# Patient Record
Sex: Female | Born: 1960
Health system: Southern US, Community
[De-identification: ages and names within clinical notes are randomized; demographics above are authoritative.]

## PROBLEM LIST (undated history)

## (undated) DIAGNOSIS — F259 Schizoaffective disorder, unspecified: Secondary | ICD-10-CM

## (undated) DIAGNOSIS — G709 Myoneural disorder, unspecified: Secondary | ICD-10-CM

## (undated) DIAGNOSIS — I10 Essential (primary) hypertension: Secondary | ICD-10-CM

## (undated) HISTORY — DX: Myoneural disorder, unspecified: G70.9

---

## 1998-05-23 ENCOUNTER — Encounter: Admission: RE | Admit: 1998-05-23 | Discharge: 1998-05-23 | Payer: Self-pay | Admitting: Family Medicine

## 1998-08-29 ENCOUNTER — Encounter: Admission: RE | Admit: 1998-08-29 | Discharge: 1998-08-29 | Payer: Self-pay | Admitting: Family Medicine

## 1998-12-11 ENCOUNTER — Encounter: Admission: RE | Admit: 1998-12-11 | Discharge: 1998-12-11 | Payer: Self-pay | Admitting: Family Medicine

## 1999-08-19 ENCOUNTER — Encounter: Admission: RE | Admit: 1999-08-19 | Discharge: 1999-08-19 | Payer: Self-pay | Admitting: Family Medicine

## 1999-08-28 ENCOUNTER — Encounter: Admission: RE | Admit: 1999-08-28 | Discharge: 1999-08-28 | Payer: Self-pay | Admitting: Family Medicine

## 1999-09-16 ENCOUNTER — Encounter: Admission: RE | Admit: 1999-09-16 | Discharge: 1999-09-16 | Payer: Self-pay | Admitting: Family Medicine

## 1999-09-18 ENCOUNTER — Encounter: Admission: RE | Admit: 1999-09-18 | Discharge: 1999-09-18 | Payer: Self-pay | Admitting: Family Medicine

## 2000-10-14 ENCOUNTER — Encounter: Admission: RE | Admit: 2000-10-14 | Discharge: 2000-10-14 | Payer: Self-pay | Admitting: Family Medicine

## 2002-05-09 ENCOUNTER — Other Ambulatory Visit: Admission: RE | Admit: 2002-05-09 | Discharge: 2002-05-09 | Payer: Self-pay | Admitting: Family Medicine

## 2002-05-09 ENCOUNTER — Encounter: Admission: RE | Admit: 2002-05-09 | Discharge: 2002-05-09 | Payer: Self-pay | Admitting: Sports Medicine

## 2002-05-17 ENCOUNTER — Encounter: Admission: RE | Admit: 2002-05-17 | Discharge: 2002-05-17 | Payer: Self-pay | Admitting: Family Medicine

## 2002-05-17 ENCOUNTER — Encounter: Payer: Self-pay | Admitting: Family Medicine

## 2002-10-26 ENCOUNTER — Encounter: Admission: RE | Admit: 2002-10-26 | Discharge: 2002-10-26 | Payer: Self-pay | Admitting: Family Medicine

## 2002-11-23 ENCOUNTER — Encounter: Admission: RE | Admit: 2002-11-23 | Discharge: 2002-11-23 | Payer: Self-pay | Admitting: Family Medicine

## 2002-11-27 ENCOUNTER — Encounter: Payer: Self-pay | Admitting: Family Medicine

## 2002-11-27 ENCOUNTER — Encounter: Admission: RE | Admit: 2002-11-27 | Discharge: 2002-11-27 | Payer: Self-pay | Admitting: Family Medicine

## 2002-12-15 ENCOUNTER — Encounter (INDEPENDENT_AMBULATORY_CARE_PROVIDER_SITE_OTHER): Payer: Self-pay | Admitting: Specialist

## 2002-12-15 ENCOUNTER — Ambulatory Visit (HOSPITAL_COMMUNITY): Admission: RE | Admit: 2002-12-15 | Discharge: 2002-12-16 | Payer: Self-pay

## 2003-01-09 ENCOUNTER — Encounter: Admission: RE | Admit: 2003-01-09 | Discharge: 2003-01-09 | Payer: Self-pay | Admitting: Family Medicine

## 2003-01-21 ENCOUNTER — Emergency Department (HOSPITAL_COMMUNITY): Admission: EM | Admit: 2003-01-21 | Discharge: 2003-01-21 | Payer: Self-pay | Admitting: Emergency Medicine

## 2003-01-21 ENCOUNTER — Emergency Department (HOSPITAL_COMMUNITY): Admission: EM | Admit: 2003-01-21 | Discharge: 2003-01-21 | Payer: Self-pay | Admitting: *Deleted

## 2004-05-19 ENCOUNTER — Encounter (INDEPENDENT_AMBULATORY_CARE_PROVIDER_SITE_OTHER): Payer: Self-pay | Admitting: *Deleted

## 2004-05-19 LAB — CONVERTED CEMR LAB

## 2004-05-22 ENCOUNTER — Ambulatory Visit: Payer: Self-pay | Admitting: Family Medicine

## 2004-05-22 ENCOUNTER — Encounter: Payer: Self-pay | Admitting: Family Medicine

## 2004-05-22 ENCOUNTER — Other Ambulatory Visit: Admission: RE | Admit: 2004-05-22 | Discharge: 2004-05-22 | Payer: Self-pay | Admitting: Family Medicine

## 2004-06-02 ENCOUNTER — Encounter: Admission: RE | Admit: 2004-06-02 | Discharge: 2004-06-02 | Payer: Self-pay | Admitting: Sports Medicine

## 2004-09-14 HISTORY — PX: CHOLECYSTECTOMY: SHX55

## 2006-11-12 ENCOUNTER — Encounter (INDEPENDENT_AMBULATORY_CARE_PROVIDER_SITE_OTHER): Payer: Self-pay | Admitting: *Deleted

## 2007-06-16 ENCOUNTER — Telehealth (INDEPENDENT_AMBULATORY_CARE_PROVIDER_SITE_OTHER): Payer: Self-pay | Admitting: *Deleted

## 2007-06-17 ENCOUNTER — Ambulatory Visit: Payer: Self-pay | Admitting: Family Medicine

## 2007-06-17 DIAGNOSIS — I10 Essential (primary) hypertension: Secondary | ICD-10-CM | POA: Insufficient documentation

## 2007-11-10 ENCOUNTER — Encounter: Payer: Self-pay | Admitting: Family Medicine

## 2007-11-10 ENCOUNTER — Ambulatory Visit: Payer: Self-pay | Admitting: Family Medicine

## 2007-11-17 ENCOUNTER — Ambulatory Visit: Payer: Self-pay | Admitting: Family Medicine

## 2007-11-17 LAB — CONVERTED CEMR LAB
ALT: 18 units/L (ref 0–35)
AST: 15 units/L (ref 0–37)
Albumin: 4.3 g/dL (ref 3.5–5.2)
Alkaline Phosphatase: 98 units/L (ref 39–117)
BUN: 12 mg/dL (ref 6–23)
CO2: 25 meq/L (ref 19–32)
Calcium: 9.7 mg/dL (ref 8.4–10.5)
Chloride: 106 meq/L (ref 96–112)
Cholesterol: 206 mg/dL — ABNORMAL HIGH (ref 0–200)
Creatinine, Ser: 0.77 mg/dL (ref 0.40–1.20)
Glucose, Bld: 91 mg/dL (ref 70–99)
HDL: 55 mg/dL (ref >39)
LDL Cholesterol: 138 mg/dL — ABNORMAL HIGH (ref 0–99)
Potassium: 4.4 meq/L (ref 3.5–5.3)
Sodium: 143 meq/L (ref 135–145)
Total Bilirubin: 0.4 mg/dL (ref 0.3–1.2)
Total CHOL/HDL Ratio: 3.7
Total Protein: 7.2 g/dL (ref 6.0–8.3)
Triglycerides: 63 mg/dL (ref <150)
VLDL: 13 mg/dL (ref 0–40)

## 2007-11-18 ENCOUNTER — Encounter (INDEPENDENT_AMBULATORY_CARE_PROVIDER_SITE_OTHER): Payer: Self-pay | Admitting: *Deleted

## 2007-12-09 ENCOUNTER — Encounter: Admission: RE | Admit: 2007-12-09 | Discharge: 2007-12-09 | Payer: Self-pay | Admitting: Family Medicine

## 2008-12-03 ENCOUNTER — Ambulatory Visit: Payer: Self-pay | Admitting: Family Medicine

## 2008-12-31 ENCOUNTER — Encounter: Payer: Self-pay | Admitting: Family Medicine

## 2009-01-03 ENCOUNTER — Ambulatory Visit: Payer: Self-pay | Admitting: Family Medicine

## 2009-01-03 DIAGNOSIS — E669 Obesity, unspecified: Secondary | ICD-10-CM

## 2009-02-24 ENCOUNTER — Telehealth: Payer: Self-pay | Admitting: Family Medicine

## 2009-03-07 ENCOUNTER — Ambulatory Visit: Payer: Self-pay | Admitting: Family Medicine

## 2009-03-07 DIAGNOSIS — M25559 Pain in unspecified hip: Secondary | ICD-10-CM

## 2009-03-20 ENCOUNTER — Emergency Department (HOSPITAL_COMMUNITY): Admission: EM | Admit: 2009-03-20 | Discharge: 2009-03-20 | Payer: Self-pay | Admitting: Emergency Medicine

## 2009-11-18 ENCOUNTER — Ambulatory Visit: Payer: Self-pay | Admitting: Family Medicine

## 2009-11-18 LAB — CONVERTED CEMR LAB: Pap Smear: NEGATIVE

## 2009-11-21 ENCOUNTER — Encounter: Payer: Self-pay | Admitting: Family Medicine

## 2010-10-14 NOTE — Assessment & Plan Note (Signed)
Summary: CPE/KH   Vital Signs:  Patient profile:   50 year old female Height:      66 inches Weight:      249.2 pounds BMI:     40.37 Temp:     97.8 degrees F oral Pulse rate:   102 / minute BP sitting:   144 / 98  (left arm) Cuff size:   large  Vitals Entered By: Garen Grams LPN (November 19, 5282 2:46 PM) CC: cpe Is Patient Diabetic? No Pain Assessment Patient in pain? no        CC:  cpe.  History of Present Illness: Feels well without complaints  THIGH, PAIN (ICD-719.45) - mild occaisional  OBESITY (ICD-278.00) knows her weight is a problem.  No specific plans  INCREASED BLOOD PRESSURE (ICD-796.2) no headache or chest pain or lightheadedness   ROS - as above PMH - Medications reviewed and updated in medication list.  Smoking Status noted in VS form      Habits & Providers  Alcohol-Tobacco-Diet     Tobacco Status: never  Current Medications (verified): 1)  Haldol Decanoate 50 Mg/ml Soln (Haloperidol Decanoate) .... Given Monthly At Mental Health  Social History: N. A. at [facility].  Lives with husband Jillyn Hidden and daughter and granddtr.  No tobacco no alcohol   Review of Systems  The patient denies anorexia, fever, weight loss, weight gain, vision loss, decreased hearing, hoarseness, chest pain, syncope, dyspnea on exertion, peripheral edema, prolonged cough, headaches, hemoptysis, abdominal pain, melena, hematochezia, severe indigestion/heartburn, hematuria, incontinence, genital sores, muscle weakness, suspicious skin lesions, transient blindness, difficulty walking, depression, unusual weight change, abnormal bleeding, enlarged lymph nodes, and breast masses.    Physical Exam  General:  Well-developed,well-nourished,in no acute distress; alert,appropriate and cooperative throughout examination Head:  Normocephalic and atraumatic without obvious abnormalities. No apparent alopecia or balding. Nose:  External nasal examination shows no deformity or  inflammation. Nasal mucosa are pink and moist without lesions or exudates. Mouth:  Oral mucosa and oropharynx without lesions or exudates.  Teeth in good repair. Neck:  No deformities, masses, or tenderness noted. Breasts:  No mass, nodules, thickening, tenderness, bulging, retraction, inflamation, nipple discharge or skin changes noted.   Lungs:  Normal respiratory effort, chest expands symmetrically. Lungs are clear to auscultation, no crackles or wheezes. Heart:  Normal rate and regular rhythm. S1 and S2 normal without gallop, murmur, click, rub or other extra sounds. Abdomen:  Bowel sounds positive,abdomen soft and non-tender without masses, organomegaly or hernias noted. Genitalia:  Normal introitus for age, no external lesions, no vaginal discharge, mucosa pink and moist, no vaginal or cervical lesions, no vaginal atrophy,  cervix is mildly friailble or hemorrhage, normal uterus size and position, no adnexal masses or tenderness Msk:  No deformity or scoliosis noted of thoracic or lumbar spine.   Extremities:  No clubbing, cyanosis, edema, or deformity noted with normal full range of motion of all joints.   Skin:  Intact without suspicious lesions or rashes Cervical Nodes:  No lymphadenopathy noted Psych:  Cognition and judgment appear intact. Alert and cooperative with normal attention span and concentration. No apparent delusions, illusions, hallucinations   Impression & Recommendations:  Problem # 1:  OBESITY (ICD-278.00) Discussed need for weight control and approaches  Problem # 2:  Preventive Health Care (ICD-V70.0) can go to three year pap if this one is normal.  Needs mammogram   Problem # 3:  INCREASED BLOOD PRESSURE (ICD-796.2) Her husband had hypertension also and they plan to  get a blood pressure cuff.  Will monitor and encourage weight loss and exercise  Complete Medication List: 1)  Haldol Decanoate 50 Mg/ml Soln (Haloperidol decanoate) .... Given monthly at mental  health  Other Orders: Pap Smear-[facility] ([id number]) [facility] - Est  40-64 yrs [phone])  Patient Instructions: 1)  Come back in 6 months to recheck your blood pressure and weight 2)  Schedule your mammogram.  3)  If your pap smear is normal we can check it again in 3 years 4)  Your weight is your main health issue.   You should aim to lose 2-3 lbs every week. 5)  Weigh yourself once a week 6)  Cut back on sweets and fats and decrease each serving of food 7)  Check your blood pressure every week.  Call us if it is usually more than 140/90 8)  Have a wonderful Spring   Prevention & Chronic Care Immunizations   Influenza vaccine: Not documented    Tetanus booster: Not documented    Pneumococcal vaccine: Not documented  Other Screening   Pap smear: Normal  (11/15/2007)   Pap smear action/deferral: Deferred-2 yr interval  (03/07/2009)   Pap smear due: 11/2009    Mammogram: Normal  (12/13/2007)   Mammogram action/deferral: Deferred-2 yr interval  (03/07/2009)   Mammogram due: 12/12/2009   Smoking status: never  (11/18/2009)  Lipids   Total Cholesterol: 206  (11/17/2007)   LDL: 138  (11/17/2007)   LDL Direct: Not documented   HDL: 55  (11/17/2007)   Triglycerides: 63  (11/17/2007)

## 2010-10-14 NOTE — Miscellaneous (Signed)
  Clinical Lists Changes  Observations: Added new observation of PAP DUE: 11/18/2012 (11/21/2009 8:34) Added new observation of PAPRECACT: Deferred-3 yr interval (11/21/2009 8:34) Added new observation of DM PROGRESS: N/A (11/21/2009 8:34) Added new observation of DM FSREVIEW: N/A (11/21/2009 8:34) Added new observation of HTN PROGRESS: N/A (11/21/2009 8:34) Added new observation of HTN FSREVIEW: N/A (11/21/2009 8:34) Added new observation of LIPID PROGRS: N/A (11/21/2009 8:34) Added new observation of LIPID FSREVW: N/A (11/21/2009 8:34)      Prevention & Chronic Care Immunizations   Influenza vaccine: Not documented    Tetanus booster: Not documented    Pneumococcal vaccine: Not documented  Other Screening   Pap smear: NEGATIVE FOR INTRAEPITHELIAL LESIONS OR MALIGNANCY.  (11/18/2009)   Pap smear action/deferral: Deferred-3 yr interval  (11/21/2009)   Pap smear due: 11/18/2012    Mammogram: Normal  (12/13/2007)   Mammogram action/deferral: Deferred-2 yr interval  (03/07/2009)   Mammogram due: 12/12/2009   Smoking status: never  (11/18/2009)  Lipids   Total Cholesterol: 206  (11/17/2007)   LDL: 138  (11/17/2007)   LDL Direct: Not documented   HDL: 55  (11/17/2007)   Triglycerides: 63  (11/17/2007)

## 2010-10-16 ENCOUNTER — Encounter: Payer: Self-pay | Admitting: *Deleted

## 2010-12-03 ENCOUNTER — Encounter: Payer: Self-pay | Admitting: Family Medicine

## 2010-12-03 ENCOUNTER — Ambulatory Visit (INDEPENDENT_AMBULATORY_CARE_PROVIDER_SITE_OTHER): Payer: Medicare Other | Admitting: Family Medicine

## 2010-12-03 VITALS — BP 134/97 | HR 86 | Temp 97.7°F | Ht 66.0 in | Wt 246.0 lb

## 2010-12-03 DIAGNOSIS — R030 Elevated blood-pressure reading, without diagnosis of hypertension: Secondary | ICD-10-CM

## 2010-12-03 DIAGNOSIS — E669 Obesity, unspecified: Secondary | ICD-10-CM

## 2010-12-03 NOTE — Progress Notes (Signed)
  Subjective:     Sally Wang is a 50 y.o. female and is here for a comprehensive physical exam. The patient reports no problems.  History   Social History  . Marital Status: Married    Spouse Name: [name]    Number of Children: N/A  . Years of Education: N/A   Occupational History  . homemaker    Social History Main Topics  . Smoking status: Never Smoker   . Smokeless tobacco: Never Used  . Alcohol Use: No  . Drug Use: No  . Sexually Active: Yes   Other Topics Concern  . Not on file   Social History Narrative   Husband - Sandeep Delagarza [id number] Cecilio Asper -[id number] -2010 granddaughtePets -  [employer] 4   Health Maintenance  Topic Date Due  . Tetanus/tdap  09/10/1980  . Pap Smear  11/18/2012    The following portions of the patient's history were reviewed and updated as appropriate: allergies, current medications, past family history, past medical history, past social history, past surgical history and problem list.  Review of Systems Patient reports no weight loss, fever problems walking, chest pain, frequent cough, hemoptysis, dyspnea, breast lumps, gastrointestinal bleeding, abdominal  masses, excessive heart burn, visual problems, hearing problems, adenopathy, prolonged hoarseness, syncope, focal weakness, memory loss, incontinence,  dysuria, hematuria, changing moles, skin ulcers, edema, depression, anxiety, vaginal sores or discharge.    Objective:    General appearance: alert and cooperative  Heart:  Normal rate and regular rhythm. S1 and S2 normal without gallop, murmur, click, rub or other extra sounds Lungs:  Normal respiratory effort, chest expands symmetrically. Lungs are clear to auscultation, no crackles or wheezes. Neck:  No deformities, thyromegaly, masses, or tenderness noted.   Full range of motion without pain. Abdomen:  soft and non-tender without masses, organomegaly or hernias noted.  No guarding or rebound Extremities:  No cyanosis, edema, or  deformity noted with good range of motion of all major joints.   Eye - Pupils Equal Round Reactive to light, Extraocular movements intact, Fundi without hemorrhage or visible lesions, Conjunctiva without redness or discharge Ears:  External ear exam shows no significant lesions or deformities.  Otoscopic examination reveals clear canals, tympanic membranes are intact bilaterally without bulging, retraction, inflammation or discharge. Hearing is grossly normal bilaterall    Assessment:      Healthy  Plan:     See After Visit Summary for Counseling Recommendations

## 2010-12-03 NOTE — Assessment & Plan Note (Signed)
Mildly elevated today.  She relates is usually well controlled with home wrist monitor. Is slowly losing weight.  Will monitor and encourage more weight loss and exercise

## 2010-12-03 NOTE — Patient Instructions (Signed)
Try to check your blood pressure on a machine every 1-2 weeks.  If it is regularly > 150/95 then come in for a visit.  Bring your readings in when you come back Things to do to Keep Yourself Healthy See your Eye Doctor  Exercise at least 30 minutes a day, 5-7 days a week.  Eat a low-fat diet with lots of fruits and vegetables. Avoid obesity. Seatbelts can save your life. Wear them always. Smoke detectors on every level of your home, check batteries every year. Eye Doctor - have an eye exam regularly Safe sex - if you may be exposed to STDs, use a condom. Alcohol If you drink, do it moderately - 1 drink per day or less. Never drink and drive. Health Care Power of Attorney - choose one ... ask [place] for forms. Depression is common - If you're feeling down or losing interest in things you normally enjoy, please call us Violence - If anyone is threatening or hurting you, please call us

## 2010-12-03 NOTE — Assessment & Plan Note (Signed)
Recommend continued weight loss and try to exercise

## 2011-01-30 NOTE — Op Note (Signed)
Sally Wang, Sally Wang                            ACCOUNT NO.:  [id number]   MEDICAL RECORD NO.:  [id number]                   PATIENT TYPE:  OIB   LOCATION:  2869                                 FACILITY:  [facility]   PHYSICIAN:  Lorre Munroe., M.D.            DATE OF BIRTH:  1960-12-17   DATE OF PROCEDURE:  12/15/2002  DATE OF DISCHARGE:                                 OPERATIVE REPORT   PREOPERATIVE DIAGNOSIS:  Symptomatic gallstones.   POSTOPERATIVE DIAGNOSIS:  Symptomatic gallstones.   OPERATION/PROCEDURE:  Laparoscopic cholecystectomy with operative  cholangiogram.   SURGEON:  Lebron Conners, M.D.   ASSISTANT:  Gabrielle Dare. Janee Morn, M.D.   ANESTHESIA:  General.   DESCRIPTION OF PROCEDURE:  After the patient was monitored and anesthetized  and had routine preparation and draping of the abdomen, I infiltrated local  anesthetic just below the umbilicus and subsequently in three additional  port sites.  I made a short transverse incision below the umbilicus and  dissected down through the midline, opened it longitudinally and then  bluntly entered the abdominal cavity.  I placed a 0 Vicryl pursestring  suture in the fascia and secured a Hasson cannula, then inflated the abdomen  with CO2.  I saw no abnormalities when I put in the camera.  I put in three  additional ports, positioned the patient head-up-foot-down and left tilted  and exposed the gallbladder.  There appeared to be a replaced right hepatic  artery which was clearly visible and the superficial hepatoduodenal  ligament.  The cystic artery could be seen coming off that and I dissected  it out and clipped and divided the cystic artery.  I then dissected out the  cystic duct and saw it clearly emerging from the infundibulum of the  gallbladder.  Placed a single clip at the distal infundibulum of the  gallbladder. I made a small hole in the cystic duct.  I put in a Reddick  cholangiogram catheter secured with a single  clip, and performed a  fluoroscopic cholangiogram which demonstrated normal size ducts with normal  anatomy and free flow into the duodenum with no evident filling defects.  A  short segment of the pancreatic duct also filled and there was no  abnormality of that evident either.  I then removed the cholangiogram  catheter and placed two clips on cystic duct distal to the hole and divided  it.  I dissected the gallbladder from the liver using the cautery.  I  accidentally made a couple of holes in it and a few stones spilled out but I  was able to scoop up and remove all of the stones.  After detaching the  gallbladder from the liver, I placed it in a plastic pouch and placed it  above the liver, then got good hemostasis in the gallbladder fossa and  copiously irrigated the right upper quadrant and removed the irrigant.  I  removed the gallbladder from the body through the umbilical incision and  tied the pursestring suture.  I then allowed the CO2 to escape after  removing the lateral ports and removed the epigastric port and closed all  incisions with intracuticular 4-0 Vicryl and Steri-Strips.                                                Lorre Munroe., M.D.    Jodi Marble  D:  12/15/2002  T:  12/17/2002  Job:  [id number]   cc:   Pearlean Brownie, M.D.  [address] [place]  [place] [address]  Fax: [phone]

## 2011-06-17 ENCOUNTER — Ambulatory Visit (INDEPENDENT_AMBULATORY_CARE_PROVIDER_SITE_OTHER): Payer: Medicare Other | Admitting: Family Medicine

## 2011-06-17 ENCOUNTER — Encounter: Payer: Self-pay | Admitting: Family Medicine

## 2011-06-17 VITALS — BP 158/90 | HR 85 | Temp 98.1°F | Wt 257.0 lb

## 2011-06-17 DIAGNOSIS — R030 Elevated blood-pressure reading, without diagnosis of hypertension: Secondary | ICD-10-CM

## 2011-06-17 DIAGNOSIS — Z23 Encounter for immunization: Secondary | ICD-10-CM

## 2011-06-17 LAB — COMPREHENSIVE METABOLIC PANEL
AST: 17 U/L (ref 0–37)
Albumin: 4.2 g/dL (ref 3.5–5.2)
Alkaline Phosphatase: 98 U/L (ref 39–117)
Glucose, Bld: 79 mg/dL (ref 70–99)
Potassium: 4.3 mEq/L (ref 3.5–5.3)
Sodium: 139 mEq/L (ref 135–145)
Total Bilirubin: 0.3 mg/dL (ref 0.3–1.2)
Total Protein: 7.4 g/dL (ref 6.0–8.3)

## 2011-06-17 LAB — CBC
Hemoglobin: 13.7 g/dL (ref 12.0–15.0)
MCH: 29.3 pg (ref 26.0–34.0)
MCHC: 31.9 g/dL (ref 30.0–36.0)
MCV: 91.9 fL (ref 78.0–100.0)
Platelets: 329 10*3/uL (ref 150–400)
RBC: 4.67 MIL/uL (ref 3.87–5.11)

## 2011-06-17 NOTE — Assessment & Plan Note (Signed)
Likely hypertension.  Will check labs and encourage weight control and salt reduction.

## 2011-06-17 NOTE — Progress Notes (Signed)
  Subjective:    Patient ID: Sally Wang, female    DOB: 11-Jan-1961, 50 y.o.   MRN: [id number]  HPI  HYPERTENSION Disease Monitoring Home BP Monitoring usual 140-50s/90s Chest pain- no     Dyspnea-  no  Medications Compliance: not currently on medications for this problem. Lightheadedness-  no  Edema-  no   ROS - See HPI  PMH Cardiovascular risk factors: family history of premature cardiovascular disease Lab Review   Potassium  Date Value Range Status  11/17/2007 4.4  3.5-5.3 (meq/L) Final     Sodium  Date Value Range Status  11/17/2007 143  135-145 (meq/L) Final     Review of Symptoms - see HPI  PMH - Smoking status noted.       Review of Systems     Objective:   Physical Exam  Heart - Regular rate and rhythm.  No murmurs, gallops or rubs.    Lungs:  Normal respiratory effort, chest expands symmetrically. Lungs are clear to auscultation, no crackles or wheezes. Extremities:  No cyanosis, edema, or deformity noted with good range of motion of all major joints.         Assessment & Plan:

## 2011-06-17 NOTE — Patient Instructions (Signed)
Continue to check your blood pressure write down the readings and bring in next visit  Work on decreasing your salt intake and high calorie foods  Walk every day for exercise  I will call you if your lab tests are not normal.  Otherwise we will discuss them at your next visit.  See Rosalita Chessman and me in one month

## 2011-07-22 ENCOUNTER — Ambulatory Visit (INDEPENDENT_AMBULATORY_CARE_PROVIDER_SITE_OTHER): Payer: Medicare Other | Admitting: Family Medicine

## 2011-07-22 ENCOUNTER — Ambulatory Visit: Payer: Medicare Other | Admitting: Family Medicine

## 2011-07-22 ENCOUNTER — Encounter: Payer: Self-pay | Admitting: Family Medicine

## 2011-07-22 VITALS — BP 139/85 | HR 91 | Temp 98.4°F | Ht 66.0 in | Wt 256.5 lb

## 2011-07-22 DIAGNOSIS — R030 Elevated blood-pressure reading, without diagnosis of hypertension: Secondary | ICD-10-CM

## 2011-07-22 DIAGNOSIS — E669 Obesity, unspecified: Secondary | ICD-10-CM

## 2011-07-22 NOTE — Patient Instructions (Addendum)
1. Keep 24 hour food journal, call Rosalita Chessman by Friday. 2. Think about way to start moving more, to get fit 3. Watch salt in diet, 1500 mg a day.   Watch sodium is not getter than 10%. 4. Keep blood pressure record once a week on back of Velia Pamer's card. 5. Follow up appointment February 2013.

## 2011-07-22 NOTE — Assessment & Plan Note (Addendum)
Assessment:  Condition remains unchanged.  Pt understand importance of being fit and weight loss.  Plan: Keep 24 hour food journal and follow up with health coach by Friday 11/9.  Reduce sodium in diet. Increase daily movement.  Continued health coaching for diet/exercise/lifestyle changes.  Follow up in 3 months. Patient seems very committed to improvement

## 2011-07-22 NOTE — Progress Notes (Signed)
  Subjective:    Patient ID: Sally Wang, female    DOB: 05-13-1961, 50 y.o.   MRN: [id number]  HPI INCREASED BLOOD PRESSURE Disease Monitoring Home BP Monitoring not doing Chest pain- no     Dyspnea-  no  Medications Compliance: not currently on medications for this problem. Lightheadedness-  no  Edema-  no   ROS - See HPI  PMH Cardiovascular risk factors: obesity (BMI >= 30 kg/m2), sedentary lifestyle and smoking/ tobacco exposure Lab Review   Potassium  Date Value Range Status  06/17/2011 4.3  3.5-5.3 (mEq/L) Final     Sodium  Date Value Range Status  06/17/2011 139  135-145 (mEq/L) Final       OBESITY  Current weight/BMI : 41.40 How long have been obese:  10 + years Course:  No change Problems or symptoms it causes:  Blood pressure   Things have tried to improve: diet modifications  Patient Identified Concern:  Pt would like to become more fit.  That includes diet change and increased exercise. Stage of Change Patient Is In:  Pt is in contemplation (planning on making changes in next 6 months.) Patient Reported Barriers:  Habits, busy helping others, money Patient Reported Perceived Benefits:  Feeling good and able to help others.  Patient Reports Self-Efficacy:   Pt displays some self-efficacy Behavior Change Supports:  Husband is also trying to make health changes Goals:  To modify diet at home, reduce sodium Patient Education:  We discussed a low sodium diet, ways to track and modify diet, ways to increase daily movement. Discussed what hypertension was, was numbers mean and how they apply to her life.      Review of Systems     Objective:   Physical Exam  No acute distress      Assessment & Plan:

## 2011-07-23 NOTE — Assessment & Plan Note (Signed)
Improved with diet and some exercise.  Recent blood work all normal.  Continue current lifestyle changes and monitor

## 2011-07-24 ENCOUNTER — Telehealth: Payer: Self-pay | Admitting: Home Health Services

## 2011-07-24 NOTE — Telephone Encounter (Signed)
Spoke with Sally Wang.  Pt was able to recall 3 day food journal to me.  Pt has increased her water intake and still drinks 2 cups of coffee a day.     Pt shared that she had pizza for dinner and then the following breakfast and I took the opportunity to talk about how much sodium is in pizza and that 2 slices was her daily amount.  This was surprising to her and we discussed alternatives to meat pizza such as plain or vegetable that has less sodium.  Pt also shared that she is only eating 2 times a day. We discussed importance of eating small meals throughout day vs 2 large ones for weight loss.   Pt is motivated to make changes and realizes that the changes are going to be small.    Joory set goal to eat small meals with healthy snacks in between and not going more than 5 hours without eating.   Pt's overall goal is to get fit, lose weight, and prevent HTN.

## 2011-07-30 ENCOUNTER — Telehealth: Payer: Self-pay | Admitting: Home Health Services

## 2011-07-30 NOTE — Telephone Encounter (Signed)
Spoke with Sally Wang.  Pt reports feeling good and having good week.  Pt went through diet for last few days and it seems as if she is eating high sodium pre-prepared foods often as well as eating fast food.  It may be important to review exactly what a lower sodium diet looks like at next appointment.  Pt has increased her daily water to over 32 oz a day but also drink 2 cups of coffee and soda daily.   Pt is not currently on any daily medications and has not been taking her bp.  Pt says she will take her bp next time she is at [employer] and maybe invest in a bp machine for the family.   Pt reports walking a little bit more and understand the importance of getting fit to feel and look good.   Pt's overall goal is weight loss and management of her bp before developing into HTN.

## 2011-08-14 ENCOUNTER — Telehealth: Payer: Self-pay | Admitting: Home Health Services

## 2011-08-14 NOTE — Telephone Encounter (Signed)
Spoke with Sally Wang  Pt not currently taking medications.  Pt not monitoring her bp.  Pt reports cooking with lower sodium and has started exercising from TV program 2x a week.  Pt reports feeling good and doesn't feel tired or sick.   Pt reports having increased her daily water to over 50 oz a day.  This weeks goals: to continue salt reduction diet, exercise, and drink water  Pt's overall goal is weight loss, bp management.

## 2011-08-27 ENCOUNTER — Telehealth: Payer: Self-pay | Admitting: Home Health Services

## 2011-08-27 NOTE — Telephone Encounter (Signed)
Spoke with Sally Wang.  Pt not currently on medications. Pt not currently monitoring her bp. Does not feel dizzy nor has headaches.  Pt reports feeling great and less tired.  Pt reports eating less fried foods, has switched sugar in coffee to artificial sweetener.   Continues to increase daily water. Continues to cook with less salt.  Pt reports exercising with TV every other day.  Pt spend 1-2 hours daily praying/reading scriptures.  Pt reports sleeping 5-6 hours a night and when she feels tired naps in the day.  Pt's goal this next week is to continue behavior changes through the holidays.  Continue to serve others and take time for herself.  Pt's overall goal is weight loss and improved health.

## 2011-09-18 ENCOUNTER — Telehealth: Payer: Self-pay | Admitting: Home Health Services

## 2011-09-18 NOTE — Telephone Encounter (Signed)
Spoke with Tauna.  Pt reports feeling good.  Pt not self-monitoring BP.  Pt reports having some set backs with diet during the holidays but recently has started new diet changes like avoiding salt, eating fruits and vegetables, and drinking water.  Pt started these changes yesterday and is encouraged to continue with them.  Pt reports that she might have gained weight over holidays.  Pt's goal this week is increase water intake, decrease soda/tea.  Pt's overall goal is weight loss, exercise, avoid HTN.

## 2011-09-25 ENCOUNTER — Telehealth: Payer: Self-pay | Admitting: Home Health Services

## 2011-09-25 NOTE — Telephone Encounter (Signed)
Spoke with Laurieanne.  Pt reports feeling good.  Pt was able to reduce fatty food intake and decrease soda/tea this past week.    Pt has not started any regular exercise routine.  Pt is not interested in losing a lot of weight but wants to feel good.  Pt set goal to eat atleast 1service of vegetables a day for this next week.  Pt's overall goal is to achieve fitnessx diet changes, and exercise routine to avoid HTN.

## 2011-10-08 ENCOUNTER — Telehealth: Payer: Self-pay | Admitting: Home Health Services

## 2011-10-08 NOTE — Telephone Encounter (Signed)
Spoke with Sally Wang.  Pt reports feeling good.   Pt self-reported wt is at 257 lbs, no wt changes.  Pt recently took bp and it was 140/91.  Pt is aware this is borderline but feels she can make the changes necessary to avoid medications.    We discussed ways to decrease bp and pt was able to generalize changes but really unable to make specific goals for her self.  I continued to ask her to make 1 specific goal for this week and again she generalized changes such as eating "healthier", exercising, and lowering salt intake.  Pt couldn't quantify how much exercise, what she actually is eating, and how much water or salt she was in taking.   Pt did mention doing exercises with tv program and walking out side when the weather is nice.  Pt did report eating more vegetables this past week but not every day.  Pt's overall goal is lowering wt and bp.

## 2011-10-23 ENCOUNTER — Telehealth: Payer: Self-pay | Admitting: Home Health Services

## 2011-10-23 NOTE — Telephone Encounter (Signed)
Spoke with Sally Wang.  Pt reports trying to eat vegetables every day.  She has also been able to start eating more regularly at least 3x a day.   Pt reports doing home exercises at least 3x a week.    Pt reports feeling good.  At next Health Coaching appointment we need to go over a balance, low sodium diet to start.  Pts overall goal is wt loss and htn prevention.

## 2011-10-30 ENCOUNTER — Telehealth: Payer: Self-pay | Admitting: Home Health Services

## 2011-10-30 NOTE — Telephone Encounter (Signed)
Spoke with Sally Wang.  Pt reports feeling well.    Pt reports exercising with TV program 4x this past week and walked outside one day.  Pt reports being more mindful about foods she eats and used example of ordering water instead of soda at restaurant.  We discussed what a low sodium diet looked like including limiting processed meats, adding salt to foods, and eating ramen noodles.  We talked about being mindful of how much salt she eats every day.  Pt set goal to continue exercising and making healthier food choices for this next week.  Pt's overall goal is wt loss, pre-htn management.   Schedule fu appointment for 12/02/11.

## 2011-11-06 ENCOUNTER — Telehealth: Payer: Self-pay | Admitting: Home Health Services

## 2011-11-06 NOTE — Telephone Encounter (Signed)
Spoke with Sally Wang.  Pt reports feeling well.  Pt measured bp on 2/19: 154/90.  Pt is aware that her bp is moving beyond borderline and that she needs to continue to make lifestyle changes to avoid medications.    Pt reported exercising 3-4x this past week and using stairs in the home for 15 mins daily.  We talked about continuing to lower sodium in the diet when eating out and cooking.  Pt's goal this week is to continue with exercises and dietary changes.  Pt's overall goal is wt loss and lowering bp.

## 2011-11-27 ENCOUNTER — Telehealth: Payer: Self-pay | Admitting: Home Health Services

## 2011-11-27 NOTE — Telephone Encounter (Signed)
Spoke with Kimyah.  Pt reports feeling okay.  Pt was unable to maintain behavior changes around diet/exercise over past 2 weeks.  Pt reports drinking "giving in" to cravings for soda.  We talked about creating specific goals and to think about specific goals that we can talk about at our next appointment next Wednesday.  It is important to create these specific goals for health coaching to be effectives.  Pt agreed.  Pt's overall goal is wt loss, pre-htn prevention.

## 2011-12-02 ENCOUNTER — Ambulatory Visit (INDEPENDENT_AMBULATORY_CARE_PROVIDER_SITE_OTHER): Payer: Medicare Other | Admitting: Family Medicine

## 2011-12-02 VITALS — BP 139/93 | HR 81 | Temp 97.8°F | Ht 66.0 in | Wt 263.4 lb

## 2011-12-02 DIAGNOSIS — R030 Elevated blood-pressure reading, without diagnosis of hypertension: Secondary | ICD-10-CM

## 2011-12-02 DIAGNOSIS — E669 Obesity, unspecified: Secondary | ICD-10-CM

## 2011-12-02 NOTE — Patient Instructions (Signed)
1. Schedule colonoscopy with [facility]. 2. Take blood pressure 3-4 times a week and keep a record. 3. If your blood pressure stays higher than 140/90, then schedule appointment.  4. Continue watch your diet and exercise. 5. Follow up with Dr. Deirdre Priest in 2-3 months.

## 2011-12-03 ENCOUNTER — Encounter: Payer: Self-pay | Admitting: Family Medicine

## 2011-12-03 NOTE — Progress Notes (Signed)
  Subjective:    Patient ID: Sally Wang, female    DOB: 11-06-1960, 51 y.o.   MRN: [id number]  HPI HYPERTENSION Disease Monitoring Home BP Monitoring not doing regularly most recent 157/94 Chest pain- no     Dyspnea-  no  Medications Compliance: not currently on medications for this problem. Lightheadedness-  no  Edema-  no   ROS - See HPI  PMH Lab Review   Potassium  Date Value Range Status  06/17/2011 4.3  3.5-5.3 (mEq/L) Final     Sodium  Date Value Range Status  06/17/2011 139  135-145 (mEq/L) Final        FHx - father died of MI in his 70s    OBESITY Current weight/BMI : Body mass index is 42.51 kg/(m^2).    How long have been obese:  10+ yrs Course:  Somewhat worsening Problems or symptoms it causes:  Pre-htn   Things have tried to improve:  Portion control   Patient Identified Concern:  Pt did not identify any concerns.  Pt does not feel that is anything she would like to work on right now.   We decided that she is finished for the time being with working with a Control and instrumentation engineer.  We left it open to restart if she feels in the future that she would need coaching.   We talked about her be pre-hypertension and how her daily behaviors with diet and exercise influence her blood pressure and weight.  Pt understand but feels good and is not concerned.       Review of Systems     Objective:   Physical Exam  No acute distress.       Assessment & Plan:

## 2011-12-03 NOTE — Assessment & Plan Note (Addendum)
Assessment:  No improvement.  Pt is not self-monitoring.  Plan: Pt will start to take BP 3-4 times a week and keep a records,  pt will continue to make lifestyle changes and follow up with in 2-3 months.   Patient has one risk factor of + father died of coronary artery disease in his 43s.  Would likely not use medications unless > upper 140s/90s.  Continue to monitor

## 2011-12-03 NOTE — Assessment & Plan Note (Signed)
Assessment:  Somewhat worsening.  Pt understand the importance of fitness but feels fine now and doesn't feel like she needs to lose weight.  Plan:  Pt will continue to monitor portions and types of foods she eats.

## 2012-05-15 ENCOUNTER — Telehealth: Payer: Self-pay | Admitting: Family Medicine

## 2012-05-15 NOTE — Telephone Encounter (Signed)
EMERGENCY LINE: Patient calls to report 3 days of "touch of laryngitis". Feels overall well. No fevers or other URI symptoms. Appetite normal, no difficulty breathing. Drinking hot tea which helps.  I do not feel patient needs to come to emergency dept at this time, and she agrees. Encourage her to continue with symptomatic treatment with hot tea, warm salt water gargles and tylenol as needed. She agrees if anything changes, she will not hesitate to come in or call our clinic when it opens on Tuesday.  Yvette Roark M. Asiana Benninger, M.D. 05/15/2012 1:48 AM

## 2012-06-05 ENCOUNTER — Telehealth: Payer: Self-pay | Admitting: Family Medicine

## 2012-06-05 NOTE — Telephone Encounter (Signed)
Patient called because she has had 3 days of a pruritic rash on her arms, top of her feet, and chin.  No fevers/chills or other systemic symptoms.  Grand-children who she helps take care of have been recently diagnosed with poison ivy.  Recommended that she try benadryl and possibly OTC hydrocortisone and make appointment in clinic if not improving in 1-2 days.

## 2013-07-05 ENCOUNTER — Encounter: Payer: Self-pay | Admitting: Home Health Services

## 2013-08-07 ENCOUNTER — Encounter: Payer: Medicare Other | Admitting: Family Medicine

## 2013-08-07 ENCOUNTER — Encounter: Payer: Self-pay | Admitting: Family Medicine

## 2013-08-07 ENCOUNTER — Ambulatory Visit (INDEPENDENT_AMBULATORY_CARE_PROVIDER_SITE_OTHER): Payer: Medicare Other | Admitting: Family Medicine

## 2013-08-07 VITALS — BP 159/105 | HR 96 | Temp 98.0°F | Ht 66.0 in | Wt 266.0 lb

## 2013-08-07 DIAGNOSIS — I10 Essential (primary) hypertension: Secondary | ICD-10-CM

## 2013-08-07 DIAGNOSIS — Z7189 Other specified counseling: Secondary | ICD-10-CM

## 2013-08-07 NOTE — Progress Notes (Signed)
Subjective:     Patient ID: Sally Wang, female   DOB: Nov 11, 1960, 52 y.o.   MRN: [id number]  HPI 52 y.o. F here for physical. Pt discussed at length desire to not have medical care and discussed appropriate medical screening and therapy. Pt states that she will pray about these recommendations and will return in 2 weeks for further discussion vs labs and screening. Pt husband, Jillyn Hidden, Was present for the entire discussion and stated understanding as well.   Filed Vitals:   08/07/13 1114  BP: 159/105  Pulse: 96  Temp: 98 F (36.7 C)   NAD  Review of Systems     Objective:   Physical Exam  #HTN: pt currently refusing medication and will pray regarding if she desires additional intervention. PT reports she will f/u in 2 weeks. Pt states understanding that risks of not treating include blindness, heart attack, stroke, kidney injury and occular injury. Pt understands this is against medical advice not starting therapy at todays visit.   Discussed pap smear, colonoscopy, BP screen, Hep C screen, mammogram, diabetes, HIV  And cholesterol.   Discussed for regarding recommendations

## 2013-08-29 ENCOUNTER — Ambulatory Visit: Payer: Medicare Other | Admitting: Family Medicine

## 2015-02-25 ENCOUNTER — Encounter (HOSPITAL_COMMUNITY): Payer: Self-pay | Admitting: Emergency Medicine

## 2015-02-25 ENCOUNTER — Emergency Department (HOSPITAL_COMMUNITY)
Admission: EM | Admit: 2015-02-25 | Discharge: 2015-02-26 | Disposition: A | Discharge status: Home / Self Care | Payer: Medicare Other | Attending: Emergency Medicine | Admitting: Emergency Medicine

## 2015-02-25 DIAGNOSIS — I10 Essential (primary) hypertension: Secondary | ICD-10-CM | POA: Insufficient documentation

## 2015-02-25 DIAGNOSIS — Z79899 Other long term (current) drug therapy: Secondary | ICD-10-CM | POA: Insufficient documentation

## 2015-02-25 DIAGNOSIS — F200 Paranoid schizophrenia: Secondary | ICD-10-CM | POA: Insufficient documentation

## 2015-02-25 DIAGNOSIS — Z9114 Patient's other noncompliance with medication regimen: Secondary | ICD-10-CM

## 2015-02-25 DIAGNOSIS — F919 Conduct disorder, unspecified: Secondary | ICD-10-CM | POA: Diagnosis present

## 2015-02-25 DIAGNOSIS — Z9119 Patient's noncompliance with other medical treatment and regimen: Secondary | ICD-10-CM | POA: Insufficient documentation

## 2015-02-25 HISTORY — DX: Schizoaffective disorder, unspecified: F25.9

## 2015-02-25 HISTORY — DX: Essential (primary) hypertension: I10

## 2015-02-25 LAB — CBC WITH DIFFERENTIAL/PLATELET
BASOS ABS: 0 10*3/uL (ref 0.0–0.1)
Basophils Relative: 0 % (ref 0–1)
EOS ABS: 0.1 10*3/uL (ref 0.0–0.7)
EOS PCT: 1 % (ref 0–5)
HEMATOCRIT: 42.3 % (ref 36.0–46.0)
HEMOGLOBIN: 14.2 g/dL (ref 12.0–15.0)
LYMPHS ABS: 2.1 10*3/uL (ref 0.7–4.0)
Lymphocytes Relative: 33 % (ref 12–46)
MCH: 30 pg (ref 26.0–34.0)
MCHC: 33.6 g/dL (ref 30.0–36.0)
MCV: 89.2 fL (ref 78.0–100.0)
MONO ABS: 0.3 10*3/uL (ref 0.1–1.0)
Monocytes Relative: 5 % (ref 3–12)
Neutro Abs: 3.8 10*3/uL (ref 1.7–7.7)
Neutrophils Relative %: 61 % (ref 43–77)
Platelets: 298 10*3/uL (ref 150–400)
RBC: 4.74 MIL/uL (ref 3.87–5.11)
RDW: 12.5 % (ref 11.5–15.5)
WBC: 6.3 10*3/uL (ref 4.0–10.5)

## 2015-02-25 LAB — ETHANOL: Alcohol, Ethyl (B): <5 mg/dL (ref <5)

## 2015-02-25 LAB — COMPREHENSIVE METABOLIC PANEL
ALK PHOS: 85 U/L (ref 38–126)
ALT: 21 U/L (ref 14–54)
ANION GAP: 12 (ref 5–15)
AST: 25 U/L (ref 15–41)
Albumin: 4 g/dL (ref 3.5–5.0)
BUN: 13 mg/dL (ref 6–20)
CO2: 25 mmol/L (ref 22–32)
Calcium: 9.6 mg/dL (ref 8.9–10.3)
Chloride: 100 mmol/L — ABNORMAL LOW (ref 101–111)
Creatinine, Ser: 0.91 mg/dL (ref 0.44–1.00)
GFR calc non Af Amer: >60 mL/min (ref >60)
Glucose, Bld: 110 mg/dL — ABNORMAL HIGH (ref 65–99)
Potassium: 3.4 mmol/L — ABNORMAL LOW (ref 3.5–5.1)
SODIUM: 137 mmol/L (ref 135–145)
Total Bilirubin: 0.6 mg/dL (ref 0.3–1.2)
Total Protein: 7.8 g/dL (ref 6.5–8.1)

## 2015-02-25 MED ORDER — METOPROLOL TARTRATE 25 MG PO TABS
25.0000 mg | ORAL_TABLET | Freq: Once | ORAL | Status: AC
  Administered 2015-02-26: 25 mg via ORAL
  Filled 2015-02-25: qty 1

## 2015-02-25 NOTE — ED Notes (Addendum)
Pt. is here with police officer / IVC papers for psychiatric evaluation from [facility] , reports disrobing , lability , delusions, non compliant and refusing medications . No suicidal ideation .

## 2015-02-25 NOTE — ED Notes (Signed)
Pt states "She has rights and is not changing into different scrubs".

## 2015-02-25 NOTE — ED Provider Notes (Signed)
CSN: [id number]     Arrival date & time 02/25/15  2237 History   First MD Initiated Contact with Patient 02/25/15 2303     This chart was scribed for Sally Kitt, MD by Arlan Organ, ED Scribe. This patient was seen in room D30C/D30C and the patient's care was started 11:16 PM.   Chief Complaint  Patient presents with  . Behavior Problem   Patient is a 54 y.o. female presenting with hypertension. The history is provided by the patient. History limited by: psychosis. No language interpreter was used.  Hypertension This is a chronic problem. The current episode started more than 1 week ago. The problem occurs constantly. The problem has not changed since onset.Nothing aggravates the symptoms. Nothing relieves the symptoms. She has tried nothing for the symptoms. The treatment provided no relief.    LEVEL 5 CAVEAT DUE TO PSYCHIATRIC DISORDER  HPI Comments: Sally Wang brought in by police under IVC paperwork is a 54 y.o. female with a PMHx of schizoaffective disorder and HTN who presents to the Emergency Department here for psychiatric evaluation this evening. [facility] reports recent behavior problems including disobedience, lability, delusions, and non complaint with medications. [facility] representative states pts blood pressure has been high and needing readings to be within normal limits prior to discharge. No known allergies to medications.She has not taken meds in over 4 years per notes in [place] and is her to have her BP brought down so she can go back to [employer].  No complaints is actively psychotic.    Past Medical History  Diagnosis Date  . Schizoaffective disorder   . Hypertension    Past Surgical History  Procedure Laterality Date  . Cholecystectomy  2006   Family History  Problem Relation Age of Onset  . Coronary artery disease Father     died 56 yo  . Diabetes Mother     died 54  . Hypertension Brother    History  Substance Use Topics  . Smoking status: Never Smoker   .  Smokeless tobacco: Never Used  . Alcohol Use: No   OB History    No data available     Review of Systems  Unable to perform ROS: Psychiatric disorder      Allergies  Review of patient's allergies indicates no known allergies.  Home Medications   Prior to Admission medications   Medication Sig Start Date End Date Taking? Authorizing Provider  haloperidol decanoate (HALDOL DECANOATE) 50 MG/ML injection Given monthly at mental health     Historical Provider, MD   Triage Vitals: BP 175/111 mmHg  Pulse 113  Temp(Src) 98 F (36.7 C) (Oral)  Resp 20  SpO2 95%   Physical Exam  Constitutional: She is oriented to person, place, and time. She appears well-developed and well-nourished. No distress.  tangency of speech   HENT:  Head: Normocephalic and atraumatic.  Mouth/Throat: Oropharynx is clear and moist.  Eyes: EOM are normal. Pupils are equal, round, and reactive to light.  Neck: Normal range of motion. Neck supple.  Cardiovascular: Normal rate, regular rhythm and normal heart sounds.   Pulmonary/Chest: Effort normal and breath sounds normal.  Abdominal: Soft. Bowel sounds are normal. She exhibits no distension. There is no tenderness. There is no rebound and no guarding.  Musculoskeletal: Normal range of motion.  Neurological: She is alert and oriented to person, place, and time.  Skin: Skin is warm and dry.  Psychiatric: Her affect is inappropriate. Her speech is tangential. Thought content is  paranoid and delusional. She expresses no homicidal and no suicidal ideation. She expresses no suicidal plans and no homicidal plans.  Nursing note and vitals reviewed.   ED Course  Procedures (including critical care time)  DIAGNOSTIC STUDIES: Oxygen Saturation is 95% on RA, adequate by my interpretation.    COORDINATION OF CARE: 11:20 PM- Will order Ethanol, urine rapid drug screen, CBC, and CMP. Discussed treatment plan with pt at bedside and pt agreed to plan.     Labs  Review Labs Reviewed  COMPREHENSIVE METABOLIC PANEL - Abnormal; Notable for the following:    Potassium 3.4 (*)    Chloride 100 (*)    Glucose, Bld 110 (*)    All other components within normal limits  ETHANOL  URINE RAPID DRUG SCREEN, HOSP PERFORMED  CBC WITH DIFFERENTIAL/PLATELET    Imaging Review No results found.   EKG Interpretation None      MDM   Final diagnoses:  None    Under IVC back to [facility] with rx for norvasc.  Stable for d/c at this time.    I personally performed the services described in this documentation, which was scribed in my presence. The recorded information has been reviewed and is accurate.    Cy Blamer, MD 02/26/15 [phone]

## 2015-02-25 NOTE — ED Notes (Signed)
Patient currently refusing to take blood pressure states "I do not feel I need it and that is my right"

## 2015-02-26 ENCOUNTER — Encounter (HOSPITAL_COMMUNITY): Payer: Self-pay | Admitting: Emergency Medicine

## 2015-02-26 ENCOUNTER — Emergency Department (HOSPITAL_COMMUNITY)
Admission: EM | Admit: 2015-02-26 | Discharge: 2015-02-26 | Disposition: A | Discharge status: Home / Self Care | Payer: Medicare Other | Attending: Emergency Medicine | Admitting: Emergency Medicine

## 2015-02-26 DIAGNOSIS — F200 Paranoid schizophrenia: Secondary | ICD-10-CM | POA: Insufficient documentation

## 2015-02-26 DIAGNOSIS — I10 Essential (primary) hypertension: Secondary | ICD-10-CM | POA: Insufficient documentation

## 2015-02-26 DIAGNOSIS — F919 Conduct disorder, unspecified: Secondary | ICD-10-CM | POA: Diagnosis present

## 2015-02-26 LAB — RAPID URINE DRUG SCREEN, HOSP PERFORMED
Amphetamines: NOT DETECTED
Barbiturates: NOT DETECTED
Benzodiazepines: NOT DETECTED
COCAINE: NOT DETECTED
OPIATES: NOT DETECTED
TETRAHYDROCANNABINOL: NOT DETECTED

## 2015-02-26 MED ORDER — AMLODIPINE BESYLATE 5 MG PO TABS: 5.0000 mg | ORAL_TABLET | Freq: Every day | ORAL | Status: DC

## 2015-02-26 MED ORDER — METOPROLOL TARTRATE 1 MG/ML IV SOLN
2.5000 mg | Freq: Once | INTRAVENOUS | Status: DC
  Filled 2015-02-26: qty 5

## 2015-02-26 NOTE — ED Notes (Signed)
Pt's son called [phone].

## 2015-02-26 NOTE — BH Assessment (Addendum)
Tele Assessment Note   Sally Wang is an 54 y.o. female with a past medical history of Schizoaffective Disorder who presented to [facility] yesterday under IVC yesterday she  stopped taking her medication. The daughter, per ED staff report, states the patient was acting "paranoid and delusional." The patient also left the house in the middle of the night and was taking her clothes off. Patient was accepted at [facility] where she was being treated until this morning, when she refused to take her Norvasc for blood pressure. Patient also refusing to eat or drink today. She states the [facility] will take care of her. Patient was sent to the ED for further evaluation.  Upon assessment, pt denies SI or hx of self-harm, denies HI, denies SA.  However, pt preoccupied with religious ideas and had to be redirected several times to answer questions.  Pt stated she doesn't know why she is in the ED, that she has God to take care of her and reports, "I am doing great as always."  Pt denies sx of depression.  Pt stated she is eating and sleeping well to this clinician.  Pt stated she hears "voices of the heart" when asked about AVH, and seemed preoccupied with these delusions.  Pt seemed suspicious of writer, asking why so many questions, and therefore, some not answered.  Pt stated she is not taking medications and she is not prescribed any.  Pt only oriented to person and place.  Pt appeared disheveled, was pleasant, cooperative, had good eye contact, rapid/pressured speech, appeared preoccupied, with euthymic mood and appropriate affect.  Per PA, pt is to be returned back to [place] once assessed by [facility].  Consulted with Claudette Head, DNP at [facility] who recommends pt be returned back to [facility] under inpatient care there.  Updated PA Ms [facility] who was in agreement with pt disposition.  Updated ED and TTS staff.  Axis I: 295.70 Schizoaffective Disorder Axis II: Deferred Axis III:  Past Medical History  Diagnosis Date  .  Schizoaffective disorder   . Hypertension    Axis IV: other psychosocial or environmental problems Axis V: 21-30 behavior considerably influenced by delusions or hallucinations OR serious impairment in judgment, communication OR inability to function in almost all areas  Past Medical History:  Past Medical History  Diagnosis Date  . Schizoaffective disorder   . Hypertension     Past Surgical History  Procedure Laterality Date  . Cholecystectomy  2006    Family History:  Family History  Problem Relation Age of Onset  . Coronary artery disease Father     died 23 yo  . Diabetes Mother     died 60  . Hypertension Brother     Social History:  reports that she has never smoked. She has never used smokeless tobacco. She reports that she does not drink alcohol or use illicit drugs.  Additional Social History:  Alcohol / Drug Use Pain Medications: see med list Prescriptions: see med list Over the Counter: see med list History of alcohol / drug use?: No history of alcohol / drug abuse Longest period of sobriety (when/how long):  (na) Negative Consequences of Use:  (na) Withdrawal Symptoms:  (na)  CIWA: CIWA-Ar BP: 128/83 mmHg Pulse Rate: 103 COWS:    PATIENT STRENGTHS: (choose at least two) Average or above average intelligence General fund of knowledge  Allergies: No Known Allergies  Home Medications:  (Not in a hospital admission)  OB/GYN Status:  Patient's last menstrual period was 02/16/2015.  General Assessment Data Location of Assessment: [facility] ED TTS Assessment: In system Is this a Tele or Face-to-Face Assessment?: Tele Assessment Is this an Initial Assessment or a Re-assessment for this encounter?: Initial Assessment Marital status:  (unknown, pt would not say) Juanell Fairly name: unknown-pt would not say Is patient pregnant?: No Pregnancy Status: No Living Arrangements: Children, Other relatives Can pt return to current living arrangement?: Yes Admission Status:  Involuntary Is patient capable of signing voluntary admission?: Yes Referral Source: Other Museum/gallery curator) Insurance type: Medicare  Medical Screening Exam [facility] Walk-in ONLY) Medical Exam completed:  (na)  Crisis Care Plan Living Arrangements: Children, Other relatives Name of Psychiatrist: none Name of Therapist: none  Education Status Is patient currently in school?: No Current Grade: na Highest grade of school patient has completed: unknown Name of school: na Contact person: na  Risk to self with the past 6 months Suicidal Ideation: No Has patient been a risk to self within the past 6 months prior to admission? : No Suicidal Intent: No Has patient had any suicidal intent within the past 6 months prior to admission? : No Is patient at risk for suicide?: No Suicidal Plan?: No Has patient had any suicidal plan within the past 6 months prior to admission? : No Access to Means: No What has been your use of drugs/alcohol within the last 12 months?: na-pt denies Previous Attempts/Gestures: No How many times?: 0 Other Self Harm Risks: na-pt denies Triggers for Past Attempts: None known Intentional Self Injurious Behavior: None Family Suicide History: Unable to assess Recent stressful life event(s): Other (Comment) (psychosis, decompensation) Persecutory voices/beliefs?:  (UTA) Depression: No (pt denies) Depression Symptoms:  (pt denies) Substance abuse history and/or treatment for substance abuse?: No Suicide prevention information given to non-admitted patients: Not applicable  Risk to Others within the past 6 months Homicidal Ideation: No Does patient have any lifetime risk of violence toward others beyond the six months prior to admission? : No Thoughts of Harm to Others: No Current Homicidal Intent: No Current Homicidal Plan: No Access to Homicidal Means: No Identified Victim: na-pt denies History of harm to others?: No Assessment of Violence: None Noted Violent Behavior  Description: pt cooperative Does patient have access to weapons?: No Criminal Charges Pending?: No Does patient have a court date: No Is patient on probation?: No  Psychosis Hallucinations: Auditory (Reports hears "voices of the heart") Delusions: Unspecified, Persecutory (Hyperreligious)  Mental Status Report Appearance/Hygiene: Disheveled Eye Contact: Good Motor Activity: Freedom of movement, Unremarkable Speech: Pressured, Rapid Level of Consciousness: Alert Mood: Euthymic, Pleasant Affect: Preoccupied Anxiety Level: Minimal Thought Processes: Flight of Ideas, Tangential Judgement: Impaired Orientation: Person, Place Obsessive Compulsive Thoughts/Behaviors: Unable to Assess  Cognitive Functioning Concentration: Unable to Assess Memory: Unable to Assess IQ: Average Insight: Poor Impulse Control: Unable to Assess Appetite:  (UTA) Weight Loss:  (unknown) Weight Gain:  (unknown) Sleep:  (pt stated she sleeps well, unknown amount of time) Total Hours of Sleep:  (unknown) Vegetative Symptoms: Unable to Assess  ADLScreening [facility] Assessment Services) Patient's cognitive ability adequate to safely complete daily activities?: Yes Patient able to express need for assistance with ADLs?: Yes Independently performs ADLs?: Yes (appropriate for developmental age)  Prior Inpatient Therapy Prior Inpatient Therapy:  (UTA) Prior Therapy Dates: unknown Prior Therapy Facilty/Provider(s): unknown Reason for Treatment: unknown  Prior Outpatient Therapy Prior Outpatient Therapy:  (UTA) Prior Therapy Dates: unknown Prior Therapy Facilty/Provider(s): unknown Reason for Treatment: unknown Does patient have an ACCT team?: Unknown Does patient have Intensive In-House Services?  :  Unknown Does patient have [facility] services? : Unknown (Was referred by [facility]) Does patient have [facility] services?: Unknown  ADL Screening (condition at time of admission) Patient's cognitive ability adequate  to safely complete daily activities?: Yes Is the patient deaf or have difficulty hearing?: No Does the patient have difficulty seeing, even when wearing glasses/contacts?: No Does the patient have difficulty concentrating, remembering, or making decisions?: Yes Patient able to express need for assistance with ADLs?: Yes Does the patient have difficulty dressing or bathing?: No Independently performs ADLs?: Yes (appropriate for developmental age) Does the patient have difficulty walking or climbing stairs?: No  Home Assistive Devices/Equipment Home Assistive Devices/Equipment: None    Abuse/Neglect Assessment (Assessment to be complete while patient is alone) Physical Abuse: Denies Verbal Abuse: Denies Sexual Abuse: Denies Exploitation of patient/patient's resources: Denies Self-Neglect: Denies Values / Beliefs Cultural Requests During Hospitalization: None Spiritual Requests During Hospitalization: None Consults Spiritual Care Consult Needed: No Social Work Consult Needed: No Merchant navy officer (For Healthcare) Does patient have an advance directive?: No Would patient like information on creating an advanced directive?: No - patient declined information    Additional Information 1:1 In Past 12 Months?:  (UTA) CIRT Risk: No Elopement Risk: No Does patient have medical clearance?: Yes     Disposition:  Disposition Initial Assessment Completed for this Encounter: Yes Disposition of Patient: Other dispositions Other disposition(s): Other (Comment) (Refer back to [facility])  Casimer Lanius, MS, [facility] Therapeutic Triage Specialist [facility]   02/26/2015 12:50 PM

## 2015-02-26 NOTE — BH Assessment (Signed)
[facility] Assessment Progress Note    Called and scheduled pt's tele assessment with this clinician as well as gathered clinical information from [facility], PA-C.  Casimer Lanius, MS, [facility] Therapeutic Triage Specialist [facility]

## 2015-02-26 NOTE — ED Notes (Addendum)
PA at bedside.

## 2015-02-26 NOTE — ED Notes (Signed)
PT having flight of ideas and talking continuously. Pt presents with police officers from [place]; was here yesterday. Pt not taking BP meds. Ideas of religiousity. Does not want to take meds because wants to "leave it up to God".

## 2015-02-26 NOTE — ED Notes (Signed)
Telepsych happening at this time.

## 2015-02-26 NOTE — ED Notes (Signed)
Per Pea, [place], [phone] - pt may return to [facility]. Pea advised if pt becomes medically unstable, will need to return to ED.

## 2015-02-26 NOTE — Discharge Instructions (Signed)
How to Take Your Blood Pressure HOW DO I GET A BLOOD PRESSURE MACHINE?  You can buy an electronic home blood pressure machine at your local pharmacy. Insurance will sometimes cover the cost if you have a prescription.  Ask your doctor what type of machine is best for you. There are different machines for your arm and your wrist.  If you decide to buy a machine to check your blood pressure on your arm, first check the size of your arm so you can buy the right size cuff. To check the size of your arm:   Use a measuring tape that shows both inches and centimeters.   Wrap the measuring tape around the upper-middle part of your arm. You may need someone to help you measure.   Write down your arm measurement in both inches and centimeters.   To measure your blood pressure correctly, it is important to have the right size cuff.   If your arm is up to 13 inches (up to 34 centimeters), get an adult cuff size.  If your arm is 13 to 17 inches (35 to 44 centimeters), get a large adult cuff size.    If your arm is 17 to 20 inches (45 to 52 centimeters), get an adult thigh cuff.  WHAT DO THE NUMBERS MEAN?   There are two numbers that make up your blood pressure. For example: 120/80.  The first number (120 in our example) is called the "systolic pressure." It is a measure of the pressure in your blood vessels when your heart is pumping blood.  The second number (80 in our example) is called the "diastolic pressure." It is a measure of the pressure in your blood vessels when your heart is resting between beats.  Your doctor will tell you what your blood pressure should be. WHAT SHOULD I DO BEFORE I CHECK MY BLOOD PRESSURE?   Try to rest or relax for at least 30 minutes before you check your blood pressure.  Do not smoke.  Do not have any drinks with caffeine, such as:  Soda.  Coffee.  Tea.  Check your blood pressure in a quiet room.  Sit down and stretch out your arm on a table.  Keep your arm at about the level of your heart. Let your arm relax.  Make sure that your legs are not crossed. HOW DO I CHECK MY BLOOD PRESSURE?  Follow the directions that came with your machine.  Make sure you remove any tight-fighting clothing from your arm or wrist. Wrap the cuff around your upper arm or wrist. You should be able to fit a finger between the cuff and your arm. If you cannot fit a finger between the cuff and your arm, it is too tight and should be removed and rewrapped.  Some units require you to manually pump up the arm cuff.  Automatic units inflate the cuff when you press a button.  Cuff deflation is automatic in both models.  After the cuff is inflated, the unit measures your blood pressure and pulse. The readings are shown on a monitor. Hold still and breathe normally while the cuff is inflated.  Getting a reading takes less than a minute.  Some models store readings in a memory. Some provide a printout of readings. If your machine does not store your readings, keep a written record.  Take readings with you to your next visit with your doctor. Document Released: 08/13/2008 Document Revised: 01/15/2014 Document Reviewed: 10/26/2013 ExitCare Patient Information   2015 [employer]. This information is not intended to replace advice given to you by your health care provider. Make sure you discuss any questions you have with your health care provider.  

## 2015-02-26 NOTE — ED Notes (Signed)
Spoke with [facility] at bedside who spoke with patient and patient decided to take oral medication. Patient was administered oral medication without incident.

## 2015-02-26 NOTE — ED Provider Notes (Signed)
CSN: [id number]     Arrival date & time 02/26/15  1034 History   First MD Initiated Contact with Patient 02/26/15 1047     No chief complaint on file.    (Consider location/radiation/quality/duration/timing/severity/associated sxs/prior Treatment) HPI Comments: Patient is a 54 year old female with a past medical history of schizoaffective disorder who was involuntarily committed yesterday after she had stopped taking her medication. The daughter, per report, states the patient was acting "paranoid and delusional." The patient also left the house in the middle of the night and was taking her clothes off. Patient was accepted at [facility] where she was being treated until this morning, when she refused to take her Norvasc for blood pressure. Patient also refusing to eat or drink today. She states the Shaune Pollack will take care of her. Patient was sent to the ED for further evaluation.   Past Medical History  Diagnosis Date  . Schizoaffective disorder   . Hypertension    Past Surgical History  Procedure Laterality Date  . Cholecystectomy  2006   Family History  Problem Relation Age of Onset  . Coronary artery disease Father     died 27 yo  . Diabetes Mother     died 39  . Hypertension Brother    History  Substance Use Topics  . Smoking status: Never Smoker   . Smokeless tobacco: Never Used  . Alcohol Use: No   OB History    No data available     Review of Systems  Constitutional: Negative for fever, chills and fatigue.  HENT: Negative for trouble swallowing.   Eyes: Negative for visual disturbance.  Respiratory: Negative for shortness of breath.   Cardiovascular: Negative for chest pain and palpitations.  Gastrointestinal: Negative for nausea, vomiting, abdominal pain and diarrhea.  Genitourinary: Negative for dysuria and difficulty urinating.  Musculoskeletal: Negative for arthralgias and neck pain.  Skin: Negative for color change.  Neurological: Negative for dizziness and  weakness.  Psychiatric/Behavioral: Positive for behavioral problems. Negative for dysphoric mood.      Allergies  Review of patient's allergies indicates no known allergies.  Home Medications   Prior to Admission medications   Medication Sig Start Date End Date Taking? Authorizing Provider  amLODipine (NORVASC) 5 MG tablet Take 1 tablet (5 mg total) by mouth daily. 02/26/15   April Palumbo, MD   BP 184/137 mmHg  Pulse 95  Temp(Src) 97.9 F (36.6 C) (Oral)  Resp 18  SpO2 98%  LMP 02/16/2015 Physical Exam  Constitutional: She is oriented to person, place, and time. She appears well-developed and well-nourished. No distress.  HENT:  Head: Normocephalic and atraumatic.  Eyes: Conjunctivae and EOM are normal.  Neck: Normal range of motion.  Cardiovascular: Normal rate and regular rhythm.  Exam reveals no gallop and no friction rub.   No murmur heard. Pulmonary/Chest: Effort normal and breath sounds normal. She has no wheezes. She has no rales. She exhibits no tenderness.  Abdominal: Soft. She exhibits no distension. There is no tenderness. There is no rebound.  Musculoskeletal: Normal range of motion.  Neurological: She is alert and oriented to person, place, and time. Coordination normal.  Speech is goal-oriented. Moves limbs without ataxia.   Skin: Skin is warm and dry.  Psychiatric: She has a normal mood and affect.  Tangential speech. Flight of ideas.   Nursing note and vitals reviewed.   ED Course  Procedures (including critical care time) Labs Review Labs Reviewed - No data to display  Imaging Review  No results found.   EKG Interpretation None      MDM   Final diagnoses:  Paranoid schizophrenia    11:13 AM Patient is currently calm with tangential speech. She refuses to eat or drink anything at this time or take any medication because she relies "on the faithfulness of the Lord."  Patient accepted back at [facility].    Emilia Beck, PA-C 02/26/15  1353  Mancel Bale, MD 02/27/15 [phone]

## 2015-02-26 NOTE — ED Notes (Signed)
Per Terrace Arabia, unable to accept pt back d/t BP and not taking med. Per Meghan, [facility], [facility] IVC'd pt and are to take pt back. Terrace Arabia, requested for someone to contact her nurse manager - [place] [phone]. Dahlia Client, SW, aware and will call.

## 2015-02-26 NOTE — ED Notes (Signed)
PT'S IVC PAPERS ON CHART. COPY SENT TO MED RECORDS. PER REPORT, KAITLYN S, PA, CONTACTED [facility] WHO ADVISED PT MAY RETURN.

## 2015-02-26 NOTE — ED Notes (Signed)
Trula Ore RN, [place] [phone] - advised she will call her MD to ensure OK for pt to return then call this RN back.

## 2015-02-26 NOTE — ED Notes (Signed)
Nurse explained to patient the Doctor ordered an IV and patient immediately refused IV sat up in stretcher ripped off pulse oximetry and [facility] who is at bedside explained to patient to lay back on stretcher.  Patient cooperated and continued to refuse an IV.

## 2015-02-26 NOTE — ED Notes (Signed)
PER HANNAH, SW, NO 2ND EXAM & RECOMMENDATION IS NEEDED FOR IVC PAPERWORK. PT WAS SENT TO ED FROM [facility] UNDER IVC D/T MEDICAL REASON.

## 2015-03-26 DIAGNOSIS — F250 Schizoaffective disorder, bipolar type: Secondary | ICD-10-CM | POA: Diagnosis not present

## 2015-03-26 DIAGNOSIS — F209 Schizophrenia, unspecified: Secondary | ICD-10-CM | POA: Diagnosis not present

## 2015-05-08 DIAGNOSIS — F250 Schizoaffective disorder, bipolar type: Secondary | ICD-10-CM | POA: Diagnosis not present

## 2015-05-08 DIAGNOSIS — F209 Schizophrenia, unspecified: Secondary | ICD-10-CM | POA: Diagnosis not present

## 2015-06-12 DIAGNOSIS — F250 Schizoaffective disorder, bipolar type: Secondary | ICD-10-CM | POA: Diagnosis not present

## 2015-06-12 DIAGNOSIS — F209 Schizophrenia, unspecified: Secondary | ICD-10-CM | POA: Diagnosis not present

## 2015-08-13 DIAGNOSIS — F250 Schizoaffective disorder, bipolar type: Secondary | ICD-10-CM | POA: Diagnosis not present

## 2015-08-13 DIAGNOSIS — F209 Schizophrenia, unspecified: Secondary | ICD-10-CM | POA: Diagnosis not present

## 2015-11-07 DIAGNOSIS — F250 Schizoaffective disorder, bipolar type: Secondary | ICD-10-CM | POA: Diagnosis not present

## 2015-11-07 DIAGNOSIS — F209 Schizophrenia, unspecified: Secondary | ICD-10-CM | POA: Diagnosis not present

## 2015-12-25 DIAGNOSIS — F209 Schizophrenia, unspecified: Secondary | ICD-10-CM | POA: Diagnosis not present

## 2015-12-25 DIAGNOSIS — F250 Schizoaffective disorder, bipolar type: Secondary | ICD-10-CM | POA: Diagnosis not present

## 2016-01-22 DIAGNOSIS — F209 Schizophrenia, unspecified: Secondary | ICD-10-CM | POA: Diagnosis not present

## 2016-01-22 DIAGNOSIS — F250 Schizoaffective disorder, bipolar type: Secondary | ICD-10-CM | POA: Diagnosis not present

## 2016-04-10 DIAGNOSIS — F250 Schizoaffective disorder, bipolar type: Secondary | ICD-10-CM | POA: Diagnosis not present

## 2016-04-10 DIAGNOSIS — F209 Schizophrenia, unspecified: Secondary | ICD-10-CM | POA: Diagnosis not present

## 2016-05-14 DIAGNOSIS — F250 Schizoaffective disorder, bipolar type: Secondary | ICD-10-CM | POA: Diagnosis not present

## 2016-05-14 DIAGNOSIS — F209 Schizophrenia, unspecified: Secondary | ICD-10-CM | POA: Diagnosis not present

## 2016-05-29 DIAGNOSIS — F209 Schizophrenia, unspecified: Secondary | ICD-10-CM | POA: Diagnosis not present

## 2016-05-29 DIAGNOSIS — F250 Schizoaffective disorder, bipolar type: Secondary | ICD-10-CM | POA: Diagnosis not present

## 2016-11-12 DIAGNOSIS — F250 Schizoaffective disorder, bipolar type: Secondary | ICD-10-CM | POA: Diagnosis not present

## 2016-11-12 DIAGNOSIS — F209 Schizophrenia, unspecified: Secondary | ICD-10-CM | POA: Diagnosis not present

## 2016-12-10 DIAGNOSIS — F250 Schizoaffective disorder, bipolar type: Secondary | ICD-10-CM | POA: Diagnosis not present

## 2016-12-10 DIAGNOSIS — F209 Schizophrenia, unspecified: Secondary | ICD-10-CM | POA: Diagnosis not present

## 2017-01-22 DIAGNOSIS — F209 Schizophrenia, unspecified: Secondary | ICD-10-CM | POA: Diagnosis not present

## 2017-01-22 DIAGNOSIS — F250 Schizoaffective disorder, bipolar type: Secondary | ICD-10-CM | POA: Diagnosis not present

## 2017-02-04 DIAGNOSIS — F250 Schizoaffective disorder, bipolar type: Secondary | ICD-10-CM | POA: Diagnosis not present

## 2017-02-04 DIAGNOSIS — F209 Schizophrenia, unspecified: Secondary | ICD-10-CM | POA: Diagnosis not present

## 2019-04-11 DIAGNOSIS — F250 Schizoaffective disorder, bipolar type: Secondary | ICD-10-CM | POA: Diagnosis not present

## 2020-01-05 ENCOUNTER — Other Ambulatory Visit: Payer: Self-pay

## 2020-01-05 DIAGNOSIS — Z1231 Encounter for screening mammogram for malignant neoplasm of breast: Secondary | ICD-10-CM

## 2020-01-16 ENCOUNTER — Emergency Department (HOSPITAL_COMMUNITY)
Admission: EM | Admit: 2020-01-16 | Discharge: 2020-01-26 | Disposition: A | Discharge status: Other Acute Inpatient Hospital | Payer: Medicare HMO | Attending: Emergency Medicine | Admitting: Emergency Medicine

## 2020-01-16 ENCOUNTER — Other Ambulatory Visit: Payer: Self-pay

## 2020-01-16 ENCOUNTER — Inpatient Hospital Stay (HOSPITAL_COMMUNITY): Admission: AD | Admit: 2020-01-16 | Payer: Medicare HMO | Admitting: Psychiatry

## 2020-01-16 ENCOUNTER — Encounter (HOSPITAL_COMMUNITY): Payer: Self-pay | Admitting: Emergency Medicine

## 2020-01-16 DIAGNOSIS — F259 Schizoaffective disorder, unspecified: Secondary | ICD-10-CM | POA: Diagnosis present

## 2020-01-16 DIAGNOSIS — F209 Schizophrenia, unspecified: Secondary | ICD-10-CM | POA: Insufficient documentation

## 2020-01-16 DIAGNOSIS — U071 COVID-19: Secondary | ICD-10-CM | POA: Diagnosis not present

## 2020-01-16 DIAGNOSIS — Z79899 Other long term (current) drug therapy: Secondary | ICD-10-CM | POA: Insufficient documentation

## 2020-01-16 DIAGNOSIS — I10 Essential (primary) hypertension: Secondary | ICD-10-CM | POA: Diagnosis not present

## 2020-01-16 DIAGNOSIS — F29 Unspecified psychosis not due to a substance or known physiological condition: Secondary | ICD-10-CM | POA: Diagnosis not present

## 2020-01-16 LAB — COMPREHENSIVE METABOLIC PANEL
ALT: 27 U/L (ref 0–44)
AST: 28 U/L (ref 15–41)
Albumin: 4 g/dL (ref 3.5–5.0)
Alkaline Phosphatase: 73 U/L (ref 38–126)
Anion gap: 14 (ref 5–15)
BUN: 14 mg/dL (ref 6–20)
CO2: 26 mmol/L (ref 22–32)
Calcium: 9.4 mg/dL (ref 8.9–10.3)
Chloride: 100 mmol/L (ref 98–111)
Creatinine, Ser: 0.93 mg/dL (ref 0.44–1.00)
GFR calc Af Amer: >60 mL/min (ref >60)
GFR calc non Af Amer: >60 mL/min (ref >60)
Glucose, Bld: 131 mg/dL — ABNORMAL HIGH (ref 70–99)
Potassium: 3.6 mmol/L (ref 3.5–5.1)
Sodium: 140 mmol/L (ref 135–145)
Total Bilirubin: 0.6 mg/dL (ref 0.3–1.2)
Total Protein: 8.6 g/dL — ABNORMAL HIGH (ref 6.5–8.1)

## 2020-01-16 LAB — ACETAMINOPHEN LEVEL: Acetaminophen (Tylenol), Serum: <10 ug/mL — ABNORMAL LOW (ref 10–30)

## 2020-01-16 LAB — CBC WITH DIFFERENTIAL/PLATELET
Abs Immature Granulocytes: 0.02 10*3/uL (ref 0.00–0.07)
Basophils Absolute: 0 10*3/uL (ref 0.0–0.1)
Basophils Relative: 0 %
Eosinophils Absolute: 0 10*3/uL (ref 0.0–0.5)
Eosinophils Relative: 1 %
HCT: 42.5 % (ref 36.0–46.0)
Hemoglobin: 12.6 g/dL (ref 12.0–15.0)
Immature Granulocytes: 1 %
Lymphocytes Relative: 36 %
Lymphs Abs: 1.3 10*3/uL (ref 0.7–4.0)
MCH: 24.1 pg — ABNORMAL LOW (ref 26.0–34.0)
MCHC: 29.6 g/dL — ABNORMAL LOW (ref 30.0–36.0)
MCV: 81.3 fL (ref 80.0–100.0)
Monocytes Absolute: 0.2 10*3/uL (ref 0.1–1.0)
Monocytes Relative: 6 %
Neutro Abs: 2.1 10*3/uL (ref 1.7–7.7)
Neutrophils Relative %: 56 %
Platelets: 264 10*3/uL (ref 150–400)
RBC: 5.23 MIL/uL — ABNORMAL HIGH (ref 3.87–5.11)
RDW: 19 % — ABNORMAL HIGH (ref 11.5–15.5)
WBC: 3.7 10*3/uL — ABNORMAL LOW (ref 4.0–10.5)
nRBC: 0 % (ref 0.0–0.2)

## 2020-01-16 LAB — RESPIRATORY PANEL BY RT PCR (FLU A&B, COVID)
Influenza A by PCR: NEGATIVE
Influenza B by PCR: NEGATIVE
SARS Coronavirus 2 by RT PCR: POSITIVE — AB

## 2020-01-16 LAB — SALICYLATE LEVEL: Salicylate Lvl: <7 mg/dL — ABNORMAL LOW (ref 7.0–30.0)

## 2020-01-16 MED ORDER — ZIPRASIDONE MESYLATE 20 MG IM SOLR
20.0000 mg | INTRAMUSCULAR | Status: DC | PRN
  Administered 2020-01-18: 11:00:00 20 mg via INTRAMUSCULAR
  Filled 2020-01-16: qty 20

## 2020-01-16 MED ORDER — ZOLPIDEM TARTRATE 5 MG PO TABS: 5.0000 mg | ORAL_TABLET | Freq: Every evening | ORAL | Status: DC | PRN

## 2020-01-16 MED ORDER — STERILE WATER FOR INJECTION IJ SOLN
INTRAMUSCULAR | Status: AC
  Administered 2020-01-16: 10:00:00 10 mL
  Filled 2020-01-16: qty 10

## 2020-01-16 MED ORDER — ZIPRASIDONE MESYLATE 20 MG IM SOLR
20.0000 mg | Freq: Once | INTRAMUSCULAR | Status: AC
Start: 2020-01-16 — End: 2020-01-16
  Administered 2020-01-16: 10:00:00 20 mg via INTRAMUSCULAR
  Filled 2020-01-16: qty 20

## 2020-01-16 MED ORDER — LORAZEPAM 1 MG PO TABS
1.0000 mg | ORAL_TABLET | ORAL | Status: DC | PRN
  Administered 2020-01-17: 23:00:00 1 mg via ORAL
  Filled 2020-01-16: qty 1

## 2020-01-16 MED ORDER — ACETAMINOPHEN 325 MG PO TABS: 650.0000 mg | ORAL_TABLET | ORAL | Status: DC | PRN

## 2020-01-16 MED ORDER — ALUM & MAG HYDROXIDE-SIMETH 200-200-20 MG/5ML PO SUSP: 30.0000 mL | Freq: Four times a day (QID) | ORAL | Status: DC | PRN

## 2020-01-16 MED ORDER — OLANZAPINE 10 MG PO TBDP
10.0000 mg | ORAL_TABLET | Freq: Once | ORAL | Status: AC
  Administered 2020-01-16: 09:00:00 10 mg via ORAL
  Filled 2020-01-16: qty 1

## 2020-01-16 MED ORDER — OLANZAPINE 5 MG PO TBDP
5.0000 mg | ORAL_TABLET | Freq: Three times a day (TID) | ORAL | Status: DC | PRN
Start: 2020-01-16 — End: 2020-01-26
  Filled 2020-01-16: qty 1

## 2020-01-16 NOTE — ED Notes (Addendum)
Daughter Florentina Jenny is very concerned about her mother and plan of care. Due to her questions, she was advised writer will try to obtain information regarding POC then call and update her. She verified her name and number which is in the chart. After Community education officer spoke with Melynda Ripple at [facility]. She was updated regarding daughters questions. At this time Jessie Foot will contact daughter and answer questions as well as obtain collateral information. Also Jessie Foot will let daughter know if she has questions to call CN.

## 2020-01-16 NOTE — ED Notes (Signed)
Officer adds that the call went out by a bystander who saw patient wondering around and was concerned. States that patient couldn't tell her name to officer when he stopped her. Pt instructed officer to where she lived. When he took her home, husband didn't know she had wondered off and wasn't concerned about patient. Children that were in the home were concerned about patient.

## 2020-01-16 NOTE — ED Notes (Signed)
Pt able to ambulate to bathroom. Pt ate lunch. Pt resting, sleeping. Sitter at bedside.

## 2020-01-16 NOTE — BH Assessment (Addendum)
Assessment Note  Sally Wang is an 59 y.o. female. Patient with a history of Schizoaffective Disorder. Patient brought in by [facility] voluntarily. Per ED notes from staff "Patient talking out of her head" per officer. Husband may be taking out IVC papers. Pt comes in with branchs that were taken before patient escorted to triage room. 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 Pt refusing to allow ED staff to obtain vital signs or answer any triage questions. Pt states that she doesn't need to be at the hospital "my vitals are good so don't touch me and just leave me alone".   Clinician completed a tele assessment with patient. She presented as pleasant but apprehensive to participate. Patient explains that [employer] brought her to [facility]. She was not clear in her response to the reason that [employer] brought her to the ED. She did not acknowledge that has a mental health diagnosis, has a an outpatient provider, and/or has ever been hospitalized for mental illness. Patient did not answer any questions directly. He response were circumstantial and tangential. She did indicate that she was not suicidal. She stated that she has never tried to harm herself in the past.    Diagnosis:    Past Medical History:  Past Medical History:  Diagnosis Date  . Hypertension   . Schizoaffective disorder [facility])     Past Surgical History:  Procedure Laterality Date  . CHOLECYSTECTOMY  2006    Family History:  Family History  Problem Relation Age of Onset  . Coronary artery disease Father        died 37 yo  . Diabetes Mother        died 52  . Hypertension Brother     Social History:  reports that she has never smoked. She has never used smokeless tobacco. She reports that she does not drink alcohol or use drugs.  Additional Social History:     CIWA: CIWA-Ar BP: (!) 187/119 Pulse Rate: (!) 115 COWS:    Allergies: No Known Allergies  Home Medications: (Not in a hospital  admission)   OB/GYN Status:  No LMP recorded. (Menstrual status: Perimenopausal).  General Assessment Data Is this an Initial Assessment or a Re-assessment for this encounter?: Initial Assessment Patient Accompanied by:: ([employer]) Language Other than English: No Living Arrangements: ("I live at home with 3 daughters") What gender do you identify as?: Female Marital status: Divorced [place] name: (unknown ) Pregnancy Status: No Living Arrangements: Children Can pt return to current living arrangement?: No Admission Status: Voluntary Is patient capable of signing voluntary admission?: Yes Referral Source: Self/Family/Friend Insurance type: (Medicaid and Medicare)     Crisis Care Plan Living Arrangements: Children Legal Guardian: (no legal guardian ) Name of Psychiatrist: (denies ) Name of Therapist: (denies )     Risk to self with the past 6 months Suicidal Ideation: No Has patient been a risk to self within the past 6 months prior to admission? : No Suicidal Intent: No Has patient had any suicidal intent within the past 6 months prior to admission? : No Is patient at risk for suicide?: No Suicidal Plan?: No Has patient had any suicidal plan within the past 6 months prior to admission? : No Access to Means: No What has been your use of drugs/alcohol within the last 12 months?: (patient denies ) Previous Attempts/Gestures: No How many times?: (0) Other Self Harm Risks: (no self harm ) Triggers for Past Attempts: Other (Comment)(denies triggers for  past attempts ) Intentional Self Injurious Behavior: None Family Suicide History: Unknown Recent stressful life event(s): Other (Comment)(patient denies ) Persecutory voices/beliefs?: No Depression: No Substance abuse history and/or treatment for substance abuse?: No Suicide prevention information given to non-admitted patients: Not applicable  Risk to Others within the past 6 months Homicidal Ideation: No Does patient have  any lifetime risk of violence toward others beyond the six months prior to admission? : No Thoughts of Harm to Others: No Current Homicidal Intent: No Current Homicidal Plan: No Access to Homicidal Means: No Identified Victim: (n/a) History of harm to others?: No Assessment of Violence: None Noted Violent Behavior Description: (patient is calm and cooperative and cooperative ) Does patient have access to weapons?: No Criminal Charges Pending?: No Does patient have a court date: No Is patient on probation?: No  Psychosis Hallucinations: (patient denies ) Delusions: (patient denies )  Mental Status Report Appearance/Hygiene: In scrubs Eye Contact: Good Motor Activity: Freedom of movement Speech: Logical/coherent Level of Consciousness: Alert Mood: Labile Affect: Apprehensive, Inconsistent with thought content Anxiety Level: (unk) Thought Processes: Circumstantial, Irrelevant, Tangential Judgement: Impaired Orientation: Place, Time, Situation, Person Obsessive Compulsive Thoughts/Behaviors: None  Cognitive Functioning Concentration: Decreased Memory: Recent Intact, Remote Intact Is patient IDD: No Insight: Fair Impulse Control: Fair Appetite: Good Have you had any weight changes? : No Change Sleep: No Change Total Hours of Sleep: (unk) Vegetative Symptoms: None  ADLScreening [facility] Assessment Services) Patient's cognitive ability adequate to safely complete daily activities?: Yes Patient able to express need for assistance with ADLs?: Yes Independently performs ADLs?: Yes (appropriate for developmental age)     Prior Outpatient Therapy Prior Outpatient Therapy: No(patient denies; However, her hx indicates she had a psychiat) Does patient have an ACCT team?: No Does patient have Intensive In-House Services?  : No Does patient have [facility] services? : No Does patient have P4CC services?: No  ADL Screening (condition at time of admission) Patient's cognitive ability  adequate to safely complete daily activities?: Yes Is the patient deaf or have difficulty hearing?: No Does the patient have difficulty seeing, even when wearing glasses/contacts?: No Does the patient have difficulty concentrating, remembering, or making decisions?: No Patient able to express need for assistance with ADLs?: Yes Does the patient have difficulty dressing or bathing?: No Independently performs ADLs?: Yes (appropriate for developmental age) Does the patient have difficulty walking or climbing stairs?: No Weakness of Legs: None Weakness of Arms/Hands: None  Home Assistive Devices/Equipment Home Assistive Devices/Equipment: None  Therapy Consults (therapy consults require a physician order) PT Evaluation Needed: No OT Evalulation Needed: No SLP Evaluation Needed: No Abuse/Neglect Assessment (Assessment to be complete while patient is alone) Physical Abuse: Denies Verbal Abuse: Denies Sexual Abuse: Denies Exploitation of patient/patient's resources: Denies     Regulatory affairs officer (For Healthcare) Does Patient Have a Medical Advance Directive?: No Would patient like information on creating a medical advance directive?: No - Patient declined Nutrition Screen- [place] Adult/WL/AP Patient's home diet: Regular        Disposition:  Disposition Initial Assessment Completed for this Encounter: Yes  On Site Evaluation by:   Reviewed with Physician:    Waldon Merl 01/16/2020 12:39 PM

## 2020-01-16 NOTE — ED Notes (Signed)
Writer has left voice message for Florentina Jenny in regards to [place]ie at [facility] trying to reach her to answer questions and obtain potential needed information regarding her mother. She is aware to Call [place] at [phone], also if writer can be of assistance, call [phone] and ask for charge nurse.

## 2020-01-16 NOTE — BH Assessment (Signed)
[facility] Assessment Progress Note  Per Shuvon Rankin, FNP, this pt requires psychiatric hospitalization.  Leavy Cella has pre-assigned pt to [facility] Rm 504-1 pending negative results of Covid-19 test.  Pt presents under IVC initiated by EDP Marianna Fuss, MD, and IVC documents have been faxed to [facility].  Pt's nurse, Waynetta Sandy, has been notified, and agrees to call report to [phone].  Pt is to be transported via Patent examiner when the time comes.   Doylene Canning, [place] Behavioral Health Coordinator [phone]

## 2020-01-16 NOTE — ED Notes (Signed)
Pt to room 28. Pt was cooperative with changing out to purple scrubs.pt labile and pointing finger at nurse. Pt was cooperative with Geodon 20 mg IM. Pt sitting in chair. Pt stating she is ready to go.

## 2020-01-16 NOTE — ED Provider Notes (Signed)
[facility]-EMERGENCY DEPT Provider Note   CSN: [id number] Arrival date & time: 01/16/20  0820     History Chief Complaint  Patient presents with  . Medical Clearance    Sally Wang is a 59 y.o. female.  Presented to ER after [facility] police called for suspicious person.  Bystander noted patient acting suspiciously called police per their report.  Please reported the patient seemed to be confused, acting somewhat erratically, wielding branches as though they were weapons however did not hurt anyone.  Initially took patient to the house and husband did not know where she had wandered off to and reports patient's children were very concerned about her.  Patient denies any acute medical complaints.  Patient is unable to explain why she is in the emergency room, unable to provide any additional history.  Flight of ideas, paranoid delusions.  Does not respond when questioned about SI or HI or AVH.  HPI     Past Medical History:  Diagnosis Date  . Hypertension   . Schizoaffective disorder [facility])     Patient Active Problem List   Diagnosis Date Noted  . Schizoaffective disorder (HCC)   . OBESITY 01/03/2009  . Uncontrolled hypertension 06/17/2007    Past Surgical History:  Procedure Laterality Date  . CHOLECYSTECTOMY  2006     OB History   No obstetric history on file.     Family History  Problem Relation Age of Onset  . Coronary artery disease Father        died 20 yo  . Diabetes Mother        died 58  . Hypertension Brother     Social History   Tobacco Use  . Smoking status: Never Smoker  . Smokeless tobacco: Never Used  Substance Use Topics  . Alcohol use: No  . Drug use: No    Home Medications Prior to Admission medications   Medication Sig Start Date End Date Taking? Authorizing Provider  amLODipine (NORVASC) 5 MG tablet Take 1 tablet (5 mg total) by mouth daily. 02/26/15   Palumbo, April, MD    Allergies    Patient has no known  allergies.  Review of Systems   Review of Systems  Unable to perform ROS: Mental status change    Physical Exam Updated Vital Signs BP (!) 187/119 (BP Location: Left Arm)   Pulse (!) 115   Temp 98.4 F (36.9 C) (Oral)   Resp 18   SpO2 97%   Physical Exam Vitals and nursing note reviewed.  Constitutional:      General: She is not in acute distress.    Appearance: She is well-developed.  HENT:     Head: Normocephalic and atraumatic.  Eyes:     Conjunctiva/sclera: Conjunctivae normal.  Cardiovascular:     Rate and Rhythm: Normal rate and regular rhythm.     Heart sounds: No murmur.  Pulmonary:     Effort: Pulmonary effort is normal. No respiratory distress.     Breath sounds: Normal breath sounds.  Abdominal:     Palpations: Abdomen is soft.     Tenderness: There is no abdominal tenderness.  Musculoskeletal:        General: No deformity or signs of injury.     Cervical back: Neck supple.  Skin:    General: Skin is warm and dry.     Capillary Refill: Capillary refill takes less than 2 seconds.  Neurological:     Mental Status: She is alert.  Comments: Alert, follows most commands, moves all four extremities purposefully, does not respond when asked about orientation questions  Psychiatric:     Comments: Rambling speech in room, flight of ideas, delusions, responding to internal stimuli, does not answer questions in any logical fashion, demonstrates no insight into current condition or situation     ED Results / Procedures / Treatments   Labs (all labs ordered are listed, but only abnormal results are displayed) Labs Reviewed  CBC WITH DIFFERENTIAL/PLATELET - Abnormal; Notable for the following components:      Result Value   WBC 3.7 (*)    RBC 5.23 (*)    MCH 24.1 (*)    MCHC 29.6 (*)    RDW 19.0 (*)    All other components within normal limits  COMPREHENSIVE METABOLIC PANEL - Abnormal; Notable for the following components:   Glucose, Bld 131 (*)    Total  Protein 8.6 (*)    All other components within normal limits  SALICYLATE LEVEL - Abnormal; Notable for the following components:   Salicylate Lvl <6.6 (*)    All other components within normal limits  ACETAMINOPHEN LEVEL - Abnormal; Notable for the following components:   Acetaminophen (Tylenol), Serum <10 (*)    All other components within normal limits  RESPIRATORY PANEL BY RT PCR (FLU A&B, COVID)  I-STAT BETA HCG BLOOD, ED ([facility], [facility], [facility] ONLY)    EKG None  Radiology No results found.  Procedures Procedures (including critical care time)  Medications Ordered in ED Medications  OLANZapine zydis (ZYPREXA) disintegrating tablet 5 mg (has no administration in time range)    And  LORazepam (ATIVAN) tablet 1 mg (has no administration in time range)    And  ziprasidone (GEODON) injection 20 mg (has no administration in time range)  acetaminophen (TYLENOL) tablet 650 mg (has no administration in time range)  zolpidem (AMBIEN) tablet 5 mg (has no administration in time range)  alum & mag hydroxide-simeth (MAALOX/MYLANTA) 200-200-20 MG/5ML suspension 30 mL (has no administration in time range)  OLANZapine zydis (ZYPREXA) disintegrating tablet 10 mg (10 mg Oral Given 01/16/20 0929)  ziprasidone (GEODON) injection 20 mg (20 mg Intramuscular Given 01/16/20 0953)  sterile water (preservative free) injection (10 mLs  Given 01/16/20 4403)    ED Course  I have reviewed the triage vital signs and the nursing notes.  Pertinent labs & imaging results that were available during my care of the patient were reviewed by me and considered in my medical decision making (see chart for details).  Clinical Course as of Jan 15 1626  Tue Jan 16, 2020  0935 Completed assessment, discussed with staff, patient not complying, acutely psychotic, will IVC, will take back to   [RD]    Clinical Course User Index [RD] Lucrezia Starch, MD   MDM Rules/Calculators/A&P                      59 year old lady who  presented to ER after by standard called police concerned about her.  On arrival here patient appears to be frankly psychotic, responding to internal stimuli, delusional, no insight into current condition.  Attempted to contact numbers listed in patient's chart, husband's phone number, no answer despite multiple attempts on my part.  Police also said that they were unable to get a hold of family after leaving pt residence. Given patient's current unstable psychiatric condition, concern for harm to self and others, completed IVC paperwork. Consult to TTS.  Will be  admitted to behavioral health for further in pt psych care.    Final Clinical Impression(s) / ED Diagnoses Final diagnoses:  Schizophrenia, unspecified type (HCC)  Psychosis, unspecified psychosis type [facility])    Rx / DC Orders ED Discharge Orders    None       Milagros Loll, MD 01/16/20 1627

## 2020-01-16 NOTE — Progress Notes (Signed)
Received Sally Wang asleep in her bed at the change of shift with the sitter at the bedside. She continued to sleep throughout the evening until she was awaken for VS check.

## 2020-01-16 NOTE — BH Assessment (Addendum)
Patient's daughter has contacted charged nurse at [facility]- earlier today to obtain information about her mother. The charge nurse asked for assistance in speaking with the family. Clinician contacted patients daughter-Sally Wang. However, she did not answer. She did later return this writers call. She was upset and concerned about her mother being in the hospital. States, "You guys are keeping her captive". The daughter is concerned about how her mother will communicate with family. Stating she hasn't spoken on the phone in 2 yrs.  During today's assessment patient did acknowledge having #3 daughters. However, stated she did not want staff speaking with them about her disposition.Therefore, counselor tried to provide guidance about the process at hand speaking in general terms. However, wasn't able to provide any specifics about patient as the daughter was wanting to know details of the assessment. The daughter appeared to be more understanding of her mothers circumstances and needs after discussing the benefits of inpatient psychiatric treatment. The daughter stated that she would like be involved in this process as much as possible and part of the discharge plan. States she is very supportive of her mother and wants to be involved.

## 2020-01-16 NOTE — Discharge Summary (Signed)
  Patient to be transferred to [facility] for inpatient psychiatric treatment 

## 2020-01-16 NOTE — ED Notes (Signed)
Pt resting in bed comfortably  Pt has been disorganized, labile,rambling, cooperative with care.

## 2020-01-16 NOTE — ED Triage Notes (Signed)
Pt brought in by [facility] voluntarily. Pt "talking out of her head" per officer. Husband may be taking out IVC papers. Pt comes in with branchs that were taken before patient escorted to triage room.  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 Pt refusing to allow ED staff to obtain vital signs or answer any triage questions. Pt states that she doesn't need to be at the hospital "my vitals are good so don't touch me and just leave me alone". ED staff left patient alone in triage room. Officer at bedside.

## 2020-01-17 ENCOUNTER — Encounter (HOSPITAL_COMMUNITY): Payer: Self-pay | Admitting: Registered Nurse

## 2020-01-17 DIAGNOSIS — F259 Schizoaffective disorder, unspecified: Secondary | ICD-10-CM | POA: Diagnosis not present

## 2020-01-17 MED ORDER — AMLODIPINE BESYLATE 5 MG PO TABS
5.0000 mg | ORAL_TABLET | Freq: Once | ORAL | Status: AC
  Administered 2020-01-17: 5 mg via ORAL
  Filled 2020-01-17: qty 1

## 2020-01-17 MED ORDER — HYDROCHLOROTHIAZIDE 12.5 MG PO CAPS
12.5000 mg | ORAL_CAPSULE | Freq: Once | ORAL | Status: AC
  Administered 2020-01-17: 12.5 mg via ORAL
  Filled 2020-01-17: qty 1

## 2020-01-17 MED ORDER — HYDROCHLOROTHIAZIDE 12.5 MG PO CAPS
12.5000 mg | ORAL_CAPSULE | Freq: Every day | ORAL | Status: DC
  Administered 2020-01-19 – 2020-01-21 (×2): 12.5 mg via ORAL
  Filled 2020-01-17 (×9): qty 1

## 2020-01-17 MED ORDER — OLANZAPINE 5 MG PO TABS
5.0000 mg | ORAL_TABLET | Freq: Two times a day (BID) | ORAL | Status: DC
  Administered 2020-01-17 (×2): 5 mg via ORAL
  Filled 2020-01-17 (×3): qty 1

## 2020-01-17 MED ORDER — AMLODIPINE BESYLATE 5 MG PO TABS
5.0000 mg | ORAL_TABLET | Freq: Once | ORAL | Status: AC
  Administered 2020-01-17: 12:00:00 5 mg via ORAL
  Filled 2020-01-17: qty 1

## 2020-01-17 MED ORDER — HYDRALAZINE HCL 10 MG PO TABS
10.0000 mg | ORAL_TABLET | Freq: Once | ORAL | Status: AC
  Administered 2020-01-17: 10 mg via ORAL
  Filled 2020-01-17: qty 1

## 2020-01-17 MED ORDER — LABETALOL HCL 100 MG PO TABS
100.0000 mg | ORAL_TABLET | Freq: Once | ORAL | Status: AC
  Administered 2020-01-17: 100 mg via ORAL
  Filled 2020-01-17: qty 1

## 2020-01-17 MED ORDER — AMLODIPINE BESYLATE 5 MG PO TABS
10.0000 mg | ORAL_TABLET | Freq: Every day | ORAL | Status: DC
  Administered 2020-01-19 – 2020-01-21 (×2): 10 mg via ORAL
  Filled 2020-01-17 (×9): qty 2

## 2020-01-17 NOTE — ED Notes (Signed)
Pt resting comfortably, sleeping, no s.s distress.

## 2020-01-17 NOTE — ED Notes (Signed)
Pt cooperative with BP medication. Pt not responding to questions. Pt irritable and defensive.

## 2020-01-17 NOTE — ED Notes (Signed)
Pt alert. Cooperative. Incongruent smiling. Pt talking to self.  Pt stated feels bad, things bad.

## 2020-01-17 NOTE — BH Assessment (Signed)
[facility] Assessment Progress Note  Pt's Covid-19 test has resulted positive, and therefore she cannot be transferred to [facility].  She remains under IVC.  I have spoken to [place] at [facility] and informed her that pt needs a behavioral health bed for a Covid positive patient, if their unit opens.  No alternatives for treating this pt are known to this Clinical research associate.  Pt's nurse, Waynetta Sandy, has been notified.  Doylene Canning, [place] Behavioral Health Coordinator [phone]

## 2020-01-17 NOTE — ED Notes (Signed)
Pt sitting on bed. Pt watching TV and talking to self.  Sitter at bedside.

## 2020-01-17 NOTE — ED Notes (Signed)
Pt alert, no s/s of distress. Pt sitting on bed. Pt talking to self. Pt was compliant with BP medication.  Pt rambling, flight of ideas, hyper religious.

## 2020-01-17 NOTE — Consult Note (Signed)
[facility] Psych ED Progress Note  01/17/2020 9:50 AM Sally Wang  MRN:  [id number]    Subjective:  "Don't feel like talking to any body but do what you got to do."  "I don't feel comfortable with people telling me I got mental issues."    Assessment:  Sally Wang, 59 y.o., female patient seen via tele psych by this provider, Dr. Lucianne Muss; and chart reviewed on 01/17/20.  On evaluation Sally Wang reports She is unable to "Conceptualize anything.  I don't feel comfortable with people telling me I have a mental disorder."  Patient states that she is not taking any medications "I don't take any medications.   No med's are given to me that I'm not unaware of.  But don't think anybody would be that vindictive."   Patient unable to give a concise answer to question, unnecessary details, rambling.  Patient stating that she lives alone.  "I live by myself.  My kids are my church family. 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  MY husband has always been unfaithful and on crack cocaine, we live in same house but separate, he lives on side where his friends come in at." Patient appears to be more pleasant today than yesterday.    Will continue to recommend gero inpatient psychiatric treatment.   Patient is Covid +, will start treatment while in ED.  Patient started on Zyprexa 5 mg Bid.    Reviewed EKG 01/16/20: 84 BPM PR interval 140 ms QRS duration 86 ms QT/QTc 386/456 ms P-R-T axes 52 -14 31  Normal Sinus rhythm Septal infarct, age undetermined Abnormal ECG When compared with ECG of 12/12/2002, Septal infarct, age undetermined is now present    Board line QTc prolongation.  Continue to monitor EKG for QTc prolongation.     Principal Problem: Schizoaffective disorder (HCC) Diagnosis:  Principal Problem:   Schizoaffective disorder (HCC)  Total Time spent with patient: 30 minutes  Past Psychiatric History: Schizoaffective disorder  Past Medical History:  Past Medical History:  Diagnosis Date  .  Hypertension   . Schizoaffective disorder [facility])     Past Surgical History:  Procedure Laterality Date  . CHOLECYSTECTOMY  2006   Family History:  Family History  Problem Relation Age of Onset  . Coronary artery disease Father        died 25 yo  . Diabetes Mother        died 9  . Hypertension Brother    Family Psychiatric  History: Unaware Social History:  Social History   Substance and Sexual Activity  Alcohol Use No     Social History   Substance and Sexual Activity  Drug Use No    Social History   Socioeconomic History  . Marital status: Married    Spouse name: [name]  . Number of children: Not on file  . Years of education: Not on file  . Highest education level: Not on file  Occupational History  . Occupation: homemaker  Tobacco Use  . Smoking status: Never Smoker  . Smokeless tobacco: Never Used  Substance and Sexual Activity  . Alcohol use: No  . Drug use: No  . Sexual activity: Not on file  Other Topics Concern  . Not on file  Social History Narrative   Husband - Nimsi Males [address] -1987   [name] -2010 granddaughte      Pets -  Pit bulls #4  Social Determinants of Health   Financial Resource Strain:   . Difficulty of Paying Living Expenses:   Food Insecurity:   . Worried About Programme researcher, broadcasting/film/video in the Last Year:   . Barista in the Last Year:   Transportation Needs:   . Freight forwarder (Medical):   Marland Kitchen Lack of Transportation (Non-Medical):   Physical Activity:   . Days of Exercise per Week:   . Minutes of Exercise per Session:   Stress:   . Feeling of Stress :   Social Connections:   . Frequency of Communication with Friends and Family:   . Frequency of Social Gatherings with Friends and Family:   . Attends Religious Services:   . Active Member of Clubs or Organizations:   . Attends Banker Meetings:   Marland Kitchen Marital Status:     Sleep: Good  Appetite:  Good  Current  Medications: Current Facility-Administered Medications  Medication Dose Route Frequency Provider Last Rate Last Admin  . acetaminophen (TYLENOL) tablet 650 mg  650 mg Oral Q4H PRN Milagros Loll, MD      . alum & mag hydroxide-simeth (MAALOX/MYLANTA) 200-200-20 MG/5ML suspension 30 mL  30 mL Oral Q6H PRN Milagros Loll, MD      . OLANZapine zydis (ZYPREXA) disintegrating tablet 5 mg  5 mg Oral Q8H PRN Milagros Loll, MD       And  . LORazepam (ATIVAN) tablet 1 mg  1 mg Oral PRN Milagros Loll, MD       And  . ziprasidone (GEODON) injection 20 mg  20 mg Intramuscular PRN Milagros Loll, MD      . OLANZapine (ZYPREXA) tablet 5 mg  5 mg Oral BID Karington Zarazua B, NP      . zolpidem (AMBIEN) tablet 5 mg  5 mg Oral QHS PRN Milagros Loll, MD       Current Outpatient Medications  Medication Sig Dispense Refill  . amLODipine (NORVASC) 5 MG tablet Take 1 tablet (5 mg total) by mouth daily. 30 tablet 0    Lab Results:  Results for orders placed or performed during the hospital encounter of 01/16/20 (from the past 48 hour(s))  CBC with Differential     Status: Abnormal   Collection Time: 01/16/20 10:02 AM  Result Value Ref Range   WBC 3.7 (L) 4.0 - 10.5 K/uL   RBC 5.23 (H) 3.87 - 5.11 MIL/uL   Hemoglobin 12.6 12.0 - 15.0 g/dL   HCT 08.6 57.8 - 46.9 %   MCV 81.3 80.0 - 100.0 fL   MCH 24.1 (L) 26.0 - 34.0 pg   MCHC 29.6 (L) 30.0 - 36.0 g/dL   RDW 62.9 (H) 52.8 - 41.3 %   Platelets 264 150 - 400 K/uL   nRBC 0.0 0.0 - 0.2 %   Neutrophils Relative % 56 %   Neutro Abs 2.1 1.7 - 7.7 K/uL   Lymphocytes Relative 36 %   Lymphs Abs 1.3 0.7 - 4.0 K/uL   Monocytes Relative 6 %   Monocytes Absolute 0.2 0.1 - 1.0 K/uL   Eosinophils Relative 1 %   Eosinophils Absolute 0.0 0.0 - 0.5 K/uL   Basophils Relative 0 %   Basophils Absolute 0.0 0.0 - 0.1 K/uL   Immature Granulocytes 1 %   Abs Immature Granulocytes 0.02 0.00 - 0.07 K/uL    Comment: Performed at [facility], [address], [place], [place] [address]  Comprehensive metabolic panel     Status: Abnormal   Collection Time: 01/16/20 10:02 AM  Result Value Ref Range   Sodium 140 135 - 145 mmol/L   Potassium 3.6 3.5 - 5.1 mmol/L   Chloride 100 98 - 111 mmol/L   CO2 26 22 - 32 mmol/L   Glucose, Bld 131 (H) 70 - 99 mg/dL    Comment: Glucose reference range applies only to samples taken after fasting for at least 8 hours.   BUN 14 6 - 20 mg/dL   Creatinine, Ser 7.25 0.44 - 1.00 mg/dL   Calcium 9.4 8.9 - 36.6 mg/dL   Total Protein 8.6 (H) 6.5 - 8.1 g/dL   Albumin 4.0 3.5 - 5.0 g/dL   AST 28 15 - 41 U/L   ALT 27 0 - 44 U/L   Alkaline Phosphatase 73 38 - 126 U/L   Total Bilirubin 0.6 0.3 - 1.2 mg/dL   GFR calc non Af Amer >60 >60 mL/min   GFR calc Af Amer >60 >60 mL/min   Anion gap 14 5 - 15    Comment: Performed at [facility], [address] [place], [place] [address]  Salicylate level     Status: Abnormal   Collection Time: 01/16/20 10:02 AM  Result Value Ref Range   Salicylate Lvl <7.0 (L) 7.0 - 30.0 mg/dL    Comment: Performed at [facility], [address] [place], [place] [address]  Acetaminophen level     Status: Abnormal   Collection Time: 01/16/20 10:02 AM  Result Value Ref Range   Acetaminophen (Tylenol), Serum <10 (L) 10 - 30 ug/mL    Comment: (NOTE) Therapeutic concentrations vary significantly. A range of 10-30 ug/mL  may be an effective concentration for many patients. However, some  are best treated at concentrations outside of this range. Acetaminophen concentrations >150 ug/mL at 4 hours after ingestion  and >50 ug/mL at 12 hours after ingestion are often associated with  toxic reactions. Performed at [facility], [address] [place], [place] [address]   Respiratory Panel by RT PCR (Flu A&B, Covid) - Nasopharyngeal Swab     Status: Abnormal   Collection Time: 01/16/20  2:19 PM   Specimen:  Nasopharyngeal Swab  Result Value Ref Range   SARS Coronavirus 2 by RT PCR POSITIVE (A) NEGATIVE    Comment: RESULT CALLED TO, READ BACK BY AND VERIFIED WITH: B.WILLIS AT 1641 ON 01/16/20 BY N.THOMPSON (NOTE) SARS-CoV-2 target nucleic acids are DETECTED. SARS-CoV-2 RNA is generally detectable in upper respiratory specimens  during the acute phase of infection. Positive results are indicative of the presence of the identified virus, but do not rule out bacterial infection or co-infection with other pathogens not detected by the test. Clinical correlation with patient history and other diagnostic information is necessary to determine patient infection status. The expected result is Negative. Fact Sheet for Patients:  https://www.moore.com/ Fact Sheet for Healthcare Providers: https://www.young.biz/ This test is not yet approved or cleared by the [place] FDA and  has been authorized for detection and/or diagnosis of SARS-CoV-2 by FDA under an Emergency Use Authorization (EUA).  This EUA will remain in effect (meaning this test can be [place] ed) for the duration of  the COVID-19 declaration under Section 564(b)(1) of the Act, 21 U.S.C. section 360bbb-3(b)(1), unless the authorization is terminated or revoked sooner.    Influenza A by PCR NEGATIVE NEGATIVE   Influenza B by PCR NEGATIVE NEGATIVE  Comment: (NOTE) The Xpert Xpress SARS-CoV-2/FLU/RSV assay is intended as an aid in  the diagnosis of influenza from Nasopharyngeal swab specimens and  should not be used as a sole basis for treatment. Nasal washings and  aspirates are unacceptable for Xpert Xpress SARS-CoV-2/FLU/RSV  testing. Fact Sheet for Patients: PinkCheek.be Fact Sheet for Healthcare Providers: GravelBags.it This test is not yet approved or cleared by the [place] FDA and  has been authorized for detection and/or  diagnosis of SARS-CoV-2 by  FDA under an Emergency Use Authorization (EUA). This EUA will remain  in effect (meaning this test can be used) for the duration of the  Covid-19 declaration under Section 564(b)(1) of the Act, 21  U.S.C. section 360bbb-3(b)(1), unless the authorization is  terminated or revoked. Performed at [facility], [place] [address] [place], [place] [address]     Blood Alcohol level:  Lab Results  Component Value Date   ETH <5 02/25/2015    Physical Findings: AIMS:  , ,  ,  ,    CIWA:    COWS:     Musculoskeletal: Strength & Muscle Tone: within normal limits Gait & Station: normal Patient leans: N/A  Psychiatric Specialty Exam: Physical Exam Vitals and nursing note reviewed. Exam conducted with a chaperone present Freight forwarder).  Pulmonary:     Effort: Pulmonary effort is normal.  Neurological:     Mental Status: She is alert.     Review of Systems  Blood pressure (!) 171/93, pulse 92, temperature 98.2 F (36.8 C), temperature source Oral, resp. rate 20, SpO2 97 %.There is no height or weight on file to calculate BMI.  General Appearance: Casual and Neat  Eye Contact:  Good  Speech:  Clear and Coherent and Pressured  Volume:  Normal  Mood:  Euphoric  Affect:  Labile  Thought Process:  Disorganized and Descriptions of Associations: Tangential  Orientation:  Full (Time, Place, and Person)  Thought Content:  Rumination and Tangential  Suicidal Thoughts:  No  Homicidal Thoughts:  No  Memory:  Immediate;   Fair Recent;   Fair  Judgement:  Impaired  Insight:  Lacking  Psychomotor Activity:  Normal  Concentration:  Concentration: Fair and Attention Span: Fair  Recall:  [employer] of Knowledge:  Fair  Language:  Fair  Akathisia:  No  Handed:  Right  AIMS (if indicated):     Assets:  Communication Skills Desire for Improvement Housing Social Support  ADL's:  Intact  Cognition:  Impaired,  Mild  Sleep:       Treatment Plan  Summary: Daily contact with patient to assess and evaluate symptoms and progress in treatment, Medication management and Plan Inpatient gero psychiatric treatment  Janeva Peaster, NP 01/17/2020, 9:50 AM

## 2020-01-17 NOTE — ED Notes (Signed)
Pt cooperative with taking medication. Pt would not take fresh water from nurse, would not respond with a reason.

## 2020-01-18 MED ORDER — OLANZAPINE 5 MG PO TABS
5.0000 mg | ORAL_TABLET | Freq: Two times a day (BID) | ORAL | Status: DC
  Administered 2020-01-19 (×2): 5 mg via ORAL
  Filled 2020-01-18: qty 1

## 2020-01-18 MED ORDER — STERILE WATER FOR INJECTION IJ SOLN
INTRAMUSCULAR | Status: AC
  Administered 2020-01-18: 10 mL
  Filled 2020-01-18: qty 10

## 2020-01-18 MED ORDER — LABETALOL HCL 5 MG/ML IV SOLN
20.0000 mg | INTRAVENOUS | Status: DC | PRN
  Filled 2020-01-18 (×2): qty 4

## 2020-01-18 MED ORDER — OLANZAPINE 10 MG IM SOLR
5.0000 mg | Freq: Two times a day (BID) | INTRAMUSCULAR | Status: DC
  Filled 2020-01-18: qty 10

## 2020-01-18 NOTE — ED Provider Notes (Addendum)
Emergency Medicine Observation Re-evaluation Note  Sally Wang is a 59 y.o. female, seen on rounds today.  Pt initially presented to the ED for complaints of Medical Clearance Currently, the patient is sitting on her bed, and conversational.  She states "I want to make a complaint."  She states that she has been "disrespected, and that she does not want to receive any medical care."  She is grandiose, stating that medical providers, lawyers, and healthcare system employees all work for her.  She has pressured speech, and is not redirectable.  Physical Exam  BP (!) 222/132   Pulse (!) 102   Temp 98.4 F (36.9 C) (Oral)   Resp 18   SpO2 98%  Physical Exam-agitated, pressured speech, paranoid, delusional.  ED Course / MDM  EKG:EKG Interpretation  Date/Time:  Tuesday Jan 16 2020 16:35:08 EDT Ventricular Rate:  84 PR Interval:  140 QRS Duration: 86 QT Interval:  386 QTC Calculation: 456 R Axis:   -14 Text Interpretation: Normal sinus rhythm Septal infarct , age undetermined Abnormal ECG When compared with ECG of 12/12/2002, Septal infarct , age undetermined is now present Confirmed by Dione Booze ([id number]) on 01/16/2020 11:30:13 PM  Clinical Course as of Jan 17 950  Tue Jan 16, 2020  0935 Completed assessment, discussed with staff, patient not complying, acutely psychotic, will IVC, will take back to   [RD]    Clinical Course User Index [RD] Milagros Loll, MD   I have reviewed the labs performed to date as well as medications administered while in observation.  Recent changes in the last 24 hours include she continues to refuse to take medications, which are necessary for her physical and mental health.  The patient is under involuntary commitment.  I agree with plans for psychiatry to give forced medications both for medical and psychiatric conditions.  Plan  Current plan is for forced medication treatment. Patient is under full IVC at this time.   Mancel Bale,  MD 01/18/20 1005  Patient's blood pressure was repeated before initiation of intravenous therapy and it improved to 148/77.  At this time the patient is sleeping.  She has not required restraint.  No indication for advancing treatment at this time, hopefully she will take medications when she arouses.  IVC remains in effect.  Forced medication treatment remains in effect.     Mancel Bale, MD 01/18/20 1535

## 2020-01-18 NOTE — ED Notes (Signed)
Pt. Refused vitals.

## 2020-01-18 NOTE — ED Notes (Signed)
Pt resting comfortably

## 2020-01-18 NOTE — ED Notes (Signed)
Pt in room. Pt refused VS. RN tried to encouragement and reassure. Pt became agitated and pointing finger and refused.

## 2020-01-18 NOTE — ED Notes (Signed)
Pt alert, no s/s of distress.  Dinner tray in room, pt not eating.

## 2020-01-18 NOTE — ED Notes (Signed)
Pt sitting on bed, no s/s of distress. Pt eating dinner. Sitter at bedside.

## 2020-01-18 NOTE — ED Notes (Signed)
Pt refused breakfast, was left in room to eat.

## 2020-01-18 NOTE — ED Notes (Signed)
Patient awake and calm. COVID precautions in place

## 2020-01-18 NOTE — ED Notes (Signed)
Patient resting quietly watching TV.

## 2020-01-18 NOTE — ED Notes (Signed)
Primary RN was able to administer Geodon.  This RN attempted to look for venous access and patient became combative (swinging arms and kicking legs).  Will reassess for access in 15-20 mins.  EDP made aware and will be placing an order for violent restraints.Sally Wang

## 2020-01-18 NOTE — ED Notes (Signed)
MHT tried to get VS, pt refused. Pt is alert, talking to self, no s/s of distress.

## 2020-01-18 NOTE — ED Notes (Signed)
RN notified MD about elevated BP and refusing medication and VS.

## 2020-01-18 NOTE — ED Notes (Signed)
Patient refusing to have BP rechecked

## 2020-01-18 NOTE — ED Notes (Signed)
Attempted to discuss plan of care with patient...patient became loud and threatening, stated "no one will be doing anything to her"-patient verbally abusive-attempted to contact ordering provider, no answer

## 2020-01-18 NOTE — ED Notes (Signed)
Pt yelling and pointing at RN when trying to interact. Pt is sitting on bed.

## 2020-01-18 NOTE — Progress Notes (Addendum)
Received Sally Wang this PM asleep in her bed with the sitter at the bedside. She continued to sleep throughout the evening. She woke up prior to 12 midnight and remain restless in her room throughout the night talking loudly to people who were not present in her room.  She was compliant with her PO medication. She opened her door to the isolation room therefore the annex door was propped open to keep the other doors closed. She continued to attempt to open  the doors and succeed a couple of times with the main door. She has been non violent.

## 2020-01-18 NOTE — Consult Note (Signed)
[facility] Psych ED Progress Note  01/18/2020 2:54 PM Sally Wang  MRN:  [id number] Subjective: "I do not have morning sweetie.  I do not feel like talking please, I want to see the head of the facility."  Attempted to assess patient along with Dr. Dwyane Dee.  Patient alert and seated, patient refuses to participate in assessment. Per attending RN, patient's behavior bizarre, patient affect labile. Patient continues to refuse medications. Attempted to offer support and encouragement, patient continues to refuse to participate in assessment.   Principal Problem: Schizoaffective disorder ([place]) Diagnosis:  Principal Problem:   Schizoaffective disorder ([place])  Total Time spent with patient: 30 minutes  Past Psychiatric History: Schizoaffective disorder  Past Medical History:  Past Medical History:  Diagnosis Date  . Hypertension   . Schizoaffective disorder [facility])     Past Surgical History:  Procedure Laterality Date  . CHOLECYSTECTOMY  2006   Family History:  Family History  Problem Relation Age of Onset  . Coronary artery disease Father        died 53 yo  . Diabetes Mother        died 47  . Hypertension Brother    Family Psychiatric  History: Unknown Social History:  Social History   Substance and Sexual Activity  Alcohol Use No     Social History   Substance and Sexual Activity  Drug Use No    Social History   Socioeconomic History  . Marital status: Married    Spouse name: Hoy Morn  . Number of children: Not on file  . Years of education: Not on file  . Highest education level: Not on file  Occupational History  . Occupation: homemaker  Tobacco Use  . Smoking status: Never Smoker  . Smokeless tobacco: Never Used  Substance and Sexual Activity  . Alcohol use: No  . Drug use: No  . Sexual activity: Not on file  Other Topics Concern  . Not on file  Social History Narrative   Husband - Eakly -2010 granddaughte      Pets -   Pit bulls #4            Social Determinants of Health   Financial Resource Strain:   . Difficulty of Paying Living Expenses:   Food Insecurity:   . Worried About Charity fundraiser in the Last Year:   . Arboriculturist in the Last Year:   Transportation Needs:   . Film/video editor (Medical):   Marland Kitchen Lack of Transportation (Non-Medical):   Physical Activity:   . Days of Exercise per Week:   . Minutes of Exercise per Session:   Stress:   . Feeling of Stress :   Social Connections:   . Frequency of Communication with Friends and Family:   . Frequency of Social Gatherings with Friends and Family:   . Attends Religious Services:   . Active Member of Clubs or Organizations:   . Attends Archivist Meetings:   Marland Kitchen Marital Status:     Sleep: Unable to assess  Appetite:  Unable to assess  Current Medications: Current Facility-Administered Medications  Medication Dose Route Frequency Provider Last Rate Last Admin  . acetaminophen (TYLENOL) tablet 650 mg  650 mg Oral Q4H PRN Lucrezia Starch, MD      . alum & mag hydroxide-simeth (MAALOX/MYLANTA) 200-200-20 MG/5ML suspension 30 mL  30 mL Oral Q6H PRN Lucrezia Starch, MD      .  amLODipine (NORVASC) tablet 10 mg  10 mg Oral Daily Curatolo, Adam, DO      . hydrochlorothiazide (MICROZIDE) capsule 12.5 mg  12.5 mg Oral Daily Curatolo, Adam, DO      . labetalol (NORMODYNE) injection 20 mg  20 mg Intravenous Q2H PRN Mancel Bale, MD      . OLANZapine (ZYPREXA) tablet 5 mg  5 mg Oral BID Patrcia Dolly, FNP       Or  . OLANZapine (ZYPREXA) injection 5 mg  5 mg Intramuscular BID Patrcia Dolly, FNP      . OLANZapine zydis (ZYPREXA) disintegrating tablet 5 mg  5 mg Oral Q8H PRN Milagros Loll, MD      . zolpidem (AMBIEN) tablet 5 mg  5 mg Oral QHS PRN Milagros Loll, MD       Current Outpatient Medications  Medication Sig Dispense Refill  . amLODipine (NORVASC) 5 MG tablet Take 1 tablet (5 mg total) by mouth daily.  30 tablet 0    Lab Results: No results found for this or any previous visit (from the past 48 hour(s)).  Blood Alcohol level:  Lab Results  Component Value Date   ETH <5 02/25/2015    Physical Findings: AIMS:  , ,  ,  ,    CIWA:    COWS:     Musculoskeletal: Strength & Muscle Tone: within normal limits Gait & Station: normal Patient leans: N/A  Psychiatric Specialty Exam: Physical Exam Vitals and nursing note reviewed.  Constitutional:      Appearance: She is well-developed.  HENT:     Head: Normocephalic.  Cardiovascular:     Rate and Rhythm: Normal rate.  Pulmonary:     Effort: Pulmonary effort is normal.  Neurological:     Mental Status: She is alert and oriented to person, place, and time.  Psychiatric:        Attention and Perception: Attention normal.        Mood and Affect: Mood is anxious. Affect is labile.        Speech: Speech normal.        Behavior: Behavior is uncooperative.        Thought Content: Thought content is paranoid.        Cognition and Memory: Cognition normal.        Judgment: Judgment is impulsive.     Review of Systems  Constitutional: Negative.   HENT: Negative.   Eyes: Negative.   Respiratory: Negative.   Cardiovascular: Negative.   Gastrointestinal: Negative.   Genitourinary: Negative.   Musculoskeletal: Negative.   Skin: Negative.   Neurological: Negative.   Psychiatric/Behavioral: Positive for behavioral problems.    Blood pressure (!) 148/77, pulse 94, temperature 98.4 F (36.9 C), temperature source Oral, resp. rate 18, SpO2 98 %.There is no height or weight on file to calculate BMI.  General Appearance: Casual and Fairly Groomed  Eye Contact:  Fair  Speech:  Clear and Coherent and Normal Rate  Volume:  Normal  Mood:  Irritable  Affect:  Labile  Thought Process:  Disorganized and Descriptions of Associations: Tangential  Orientation:  Other:  Unable to assess  Thought Content:  Paranoid Ideation  Suicidal Thoughts:   Unable to assess  Homicidal Thoughts:  Unable to assess  Memory:  Immediate;   Unable to assess  Judgement:  Impaired  Insight:  Lacking  Psychomotor Activity:  Normal  Concentration:  Attention Span: Fair  Recall:  Unable to assess  Fund of  Knowledge:  Good  Language:  Good  Akathisia:  No  Handed:  Right  AIMS (if indicated):     Assets:  Communication Skills Desire for Improvement Financial Resources/Insurance Housing Intimacy Leisure Time Physical Health Resilience Social Support Talents/Skills  ADL's:  Intact  Cognition:  WNL  Sleep:         Treatment Plan Summary: Medication management recommend Zyprexa 5 mg twice daily p.o. or IM.  Recommend avoid use of Geodon. Inpatient psychiatric treatment recommended however patient difficult to place related to Covid positive status.  Patrcia Dolly, FNP 01/18/2020, 2:54 PM

## 2020-01-18 NOTE — ED Notes (Signed)
Pt was cooperative with  VS. Pt talked and moved while taking VS. . Pt hyperverbal. Hyper religious. Irritable. Uncooperative with nurse.  Pointing at nurse, telling nurse you can not touch her. Pt talking to self.  Pt given fluids and dinner.   Notified MD about BP and HR. MD stated hold off in BP and she will look into it.

## 2020-01-18 NOTE — ED Notes (Signed)
Patient watching TV.

## 2020-01-19 MED ORDER — OLANZAPINE 10 MG IM SOLR
5.0000 mg | Freq: Every day | INTRAMUSCULAR | Status: DC
  Administered 2020-01-20 – 2020-01-26 (×7): 5 mg via INTRAMUSCULAR
  Filled 2020-01-19 (×7): qty 10

## 2020-01-19 MED ORDER — OLANZAPINE 10 MG IM SOLR
10.0000 mg | Freq: Every day | INTRAMUSCULAR | Status: DC
  Administered 2020-01-20: 22:00:00 10 mg via INTRAMUSCULAR
  Filled 2020-01-19: qty 10

## 2020-01-19 MED ORDER — OLANZAPINE 10 MG PO TABS
10.0000 mg | ORAL_TABLET | Freq: Every day | ORAL | Status: DC
  Filled 2020-01-19 (×2): qty 1

## 2020-01-19 MED ORDER — OLANZAPINE 5 MG PO TABS
5.0000 mg | ORAL_TABLET | Freq: Every day | ORAL | Status: DC
  Filled 2020-01-19 (×6): qty 1

## 2020-01-19 NOTE — ED Provider Notes (Signed)
Emergency Medicine Observation Re-evaluation Note  Sally Wang is a 59 y.o. female, seen on rounds today.  Pt initially presented to the ED for complaints of Medical Clearance Currently, the patient is awake, sitting in bed, speaking randomly with flight of ideas and delusions in room. Not redirectable.  Physical Exam  BP (!) 148/112 (BP Location: Left Arm)   Pulse 86   Temp 98.6 F (37 C) (Oral)   Resp 18   SpO2 98%  Physical Exam agitated, pressured speech, paranoid, delusional.  ED Course / MDM  EKG:EKG Interpretation  Date/Time:  Tuesday Jan 16 2020 16:35:08 EDT Ventricular Rate:  84 PR Interval:  140 QRS Duration: 86 QT Interval:  386 QTC Calculation: 456 R Axis:   -14 Text Interpretation: Normal sinus rhythm Septal infarct , age undetermined Abnormal ECG When compared with ECG of 12/12/2002, Septal infarct , age undetermined is now present Confirmed by Dione Booze ([id number]) on 01/16/2020 11:30:13 PM  Clinical Course as of Jan 19 736  Tue Jan 16, 2020  0935 Completed assessment, discussed with staff, patient not complying, acutely psychotic, will IVC, will take back to   [RD]    Clinical Course User Index [RD] Milagros Loll, MD   I have reviewed the labs performed to date as well as medications administered while in observation.  Recent changes in the last 24 hours included - no major change, awaiting placement. Plan  Current plan is for in patient. Difficult due to covid status. Patient is under full IVC at this time.   Milagros Loll, MD 01/19/20 [phone]

## 2020-01-19 NOTE — Progress Notes (Signed)
01/19/2020  1700  Patient's sister came to visit. I refused for the sister to come back to visit with the patient due to Covid +. I spoke with the sister on the phone and clarified to her that it is one visitor for the entire stay not one visitor per day. I told the sister she needed to get with the family and they needed one designated person that can come and visit. She understood.  Then I received a call from [place] the patients daughter. Again I notified Maxine Glenn the policy is one visitor for the entire stay not one per day. Monica asked if someone could just come in to lay eyes on her at the door. I told Maxine Glenn if someone wanted to come today 01/19/2020 before I left at 7pm that would be fine but that would be a one time offer. Monica voiced understanding and stated she will be getting with the family to make the decision on who the one person will be to come visit and she would call back and notify staff. Maxine Glenn also stated she could not be that person d/t she had to work. She also informed me that the patient's husband is trying to obtain guardianship. Maxine Glenn feels like the hospital is making decisions for her mom that the family should be included in. She states they want the patient to get help if that is what is needed but the just want to be part of the decision making process. She also has a fears of the Covid dx her mom has that mom isn't doing well, but I reassured her that mom is doing well with the covid dx at this point, but her mental health is also the focus.

## 2020-01-19 NOTE — ED Notes (Signed)
Patient refusing vital signs and medications. Yelled at this RN to leave when I asked her how she got to the hospital. Stated "I don't claim negative thoughts." Constant conversation in the room with no person present. Says she is talking to God.

## 2020-01-19 NOTE — BH Assessment (Signed)
[facility] Assessment Progress Note  Per Berneice Heinrich, FNP, this pt continues to require psychiatric hospitalization.  Pt presents under IVC initiated by EDP Marianna Fuss, MD.  At 14:19 this writer called [facility] to inquire whether pt being Covid-19 positive would exclude her from consideration for admission to their facility.  I spoke to Amor, RN, the admissions nurse, who reports that this is not exclusionary criteria.  I then spoke to [facility] who took demographic information.  At 15:52 I spoke to [facility] at the [facility] and obtained authorization for [facility] referral, authorization [id number] from 01/19/2020 - 01/25/2020.  Please note that authorization does not mean that pt has been accepted to the facility.  At 16:18 I called back to [facility] and spoke to [place], who confirms receipt of fax.  As of this writing a final decision is pending.   Doylene Canning, MA Triage Specialist [phone]

## 2020-01-19 NOTE — Consult Note (Addendum)
[facility] Psych ED Progress Note  01/19/2020 10:17 AM Sally Wang  MRN:  [id number] Subjective:01/19/2020 "this is the day the Shaune Pollack has made, rejoice and be glad in it, it is the most fabulous day of my life." Patient answer psychiatrist "I am a psychiatrist too, I am blessed." Patient assessed by nurse practitioner along with Dr. Lucianne Muss.  Patient alert and oriented, patient does not meaningfully participate in assessment.  Patient hyper religious with elated mood at this time.  Patient conversation disorganized.  Patient states "black and white, being a black woman got always made provisions, things work so beautifully." Patient more cooperative with staff today, per patient's attending RN patient compliant with medications at this time.   "I do not have morning sweetie.  I do not feel like talking please, I want to see the head of the facility."  Attempted to assess patient along with Dr. Lucianne Muss.  Patient alert and seated, patient refuses to participate in assessment. Per attending RN, patient's behavior bizarre, patient affect labile. Patient continues to refuse medications. Attempted to offer support and encouragement, patient continues to refuse to participate in assessment.   Principal Problem: Schizoaffective disorder ([facility]) Diagnosis:  Principal Problem:   Schizoaffective disorder ([facility])  Total Time spent with patient: 30 minutes  Past Psychiatric History: Schizoaffective disorder  Past Medical History:  Past Medical History:  Diagnosis Date  . Hypertension   . Schizoaffective disorder [facility])     Past Surgical History:  Procedure Laterality Date  . CHOLECYSTECTOMY  2006   Family History:  Family History  Problem Relation Age of Onset  . Coronary artery disease Father        died 92 yo  . Diabetes Mother        died 29  . Hypertension Brother    Family Psychiatric  History: Unknown Social History:  Social History   Substance and Sexual Activity  Alcohol Use No     Social  History   Substance and Sexual Activity  Drug Use No    Social History   Socioeconomic History  . Marital status: Married    Spouse name: [name]  . Number of children: Not on file  . Years of education: Not on file  . Highest education level: Not on file  Occupational History  . Occupation: homemaker  Tobacco Use  . Smoking status: Never Smoker  . Smokeless tobacco: Never Used  Substance and Sexual Activity  . Alcohol use: No  . Drug use: No  . Sexual activity: Not on file  Other Topics Concern  . Not on file  Social History Narrative   Husband - Kyler Lerette [address] -1987   [name] -2010 granddaughte      Pets -  Pit bulls #4            Social Determinants of Health   Financial Resource Strain:   . Difficulty of Paying Living Expenses:   Food Insecurity:   . Worried About Programme researcher, broadcasting/film/video in the Last Year:   . Barista in the Last Year:   Transportation Needs:   . Freight forwarder (Medical):   Marland Kitchen Lack of Transportation (Non-Medical):   Physical Activity:   . Days of Exercise per Week:   . Minutes of Exercise per Session:   Stress:   . Feeling of Stress :   Social Connections:   . Frequency of Communication with Friends and Family:   . Frequency of Social Gatherings  with Friends and Family:   . Attends Religious Services:   . Active Member of Clubs or Organizations:   . Attends Archivist Meetings:   Marland Kitchen Marital Status:     Sleep: Unable to assess  Appetite:  Unable to assess  Current Medications: Current Facility-Administered Medications  Medication Dose Route Frequency Provider Last Rate Last Admin  . acetaminophen (TYLENOL) tablet 650 mg  650 mg Oral Q4H PRN Lucrezia Starch, MD      . alum & mag hydroxide-simeth (MAALOX/MYLANTA) 200-200-20 MG/5ML suspension 30 mL  30 mL Oral Q6H PRN Lucrezia Starch, MD      . amLODipine (NORVASC) tablet 10 mg  10 mg Oral Daily Curatolo, Adam, DO   10 mg at 01/19/20 0816  .  hydrochlorothiazide (MICROZIDE) capsule 12.5 mg  12.5 mg Oral Daily Curatolo, Adam, DO   12.5 mg at 01/19/20 0816  . labetalol (NORMODYNE) injection 20 mg  20 mg Intravenous Q2H PRN Daleen Bo, MD      . OLANZapine (ZYPREXA) tablet 10 mg  10 mg Oral QHS Emmaline Kluver, FNP       Or  . OLANZapine (ZYPREXA) injection 10 mg  10 mg Intramuscular QHS Emmaline Kluver, FNP      . [START ON 01/20/2020] OLANZapine (ZYPREXA) tablet 5 mg  5 mg Oral Daily Emmaline Kluver, FNP       Or  . Derrill Memo ON 01/20/2020] OLANZapine (ZYPREXA) injection 5 mg  5 mg Intramuscular Daily Emmaline Kluver, FNP      . OLANZapine zydis (ZYPREXA) disintegrating tablet 5 mg  5 mg Oral Q8H PRN Lucrezia Starch, MD      . zolpidem (AMBIEN) tablet 5 mg  5 mg Oral QHS PRN Lucrezia Starch, MD       Current Outpatient Medications  Medication Sig Dispense Refill  . amLODipine (NORVASC) 5 MG tablet Take 1 tablet (5 mg total) by mouth daily. 30 tablet 0    Lab Results: No results found for this or any previous visit (from the past 48 hour(s)).  Blood Alcohol level:  Lab Results  Component Value Date   ETH <5 02/25/2015    Physical Findings: AIMS:  , ,  ,  ,    CIWA:    COWS:     Musculoskeletal: Strength & Muscle Tone: within normal limits Gait & Station: normal Patient leans: N/A  Psychiatric Specialty Exam: Physical Exam Vitals and nursing note reviewed.  Constitutional:      Appearance: She is well-developed.  HENT:     Head: Normocephalic.  Cardiovascular:     Rate and Rhythm: Normal rate.  Pulmonary:     Effort: Pulmonary effort is normal.  Neurological:     Mental Status: She is alert and oriented to person, place, and time.  Psychiatric:        Attention and Perception: Attention normal.        Mood and Affect: Mood is elated.        Speech: Speech is tangential.        Behavior: Behavior is cooperative.        Cognition and Memory: Cognition normal.        Judgment: Judgment is impulsive.     Review of  Systems  Constitutional: Negative.   HENT: Negative.   Eyes: Negative.   Respiratory: Negative.   Cardiovascular: Negative.   Gastrointestinal: Negative.   Genitourinary: Negative.   Musculoskeletal: Negative.   Skin: Negative.  Neurological: Negative.     Blood pressure (!) 161/110, pulse 89, temperature 98.4 F (36.9 C), temperature source Oral, resp. rate 20, SpO2 99 %.There is no height or weight on file to calculate BMI.  General Appearance: Casual and Fairly Groomed  Eye Contact:  Fair  Speech:  Clear and Coherent and Pressured  Volume:  Normal  Mood:  Euphoric  Affect:  Congruent  Thought Process:  Disorganized and Descriptions of Associations: Tangential  Orientation:  Full (Time, Place, and Person)  Thought Content:  Tangential  Suicidal Thoughts:  No  Homicidal Thoughts:  No  Memory:  Immediate;   Fair  Judgement:  Impaired  Insight:  Lacking  Psychomotor Activity:  Normal  Concentration:  Attention Span: Poor  Recall:  Fair  Fund of Knowledge:  Good  Language:  Good  Akathisia:  No  Handed:  Right  AIMS (if indicated):     Assets:  Communication Skills Desire for Improvement Financial Resources/Insurance Housing Intimacy Leisure Time Physical Health Resilience Social Support Talents/Skills  ADL's:  Intact  Cognition:  WNL  Sleep:         Treatment Plan Summary: Medication management recommend Zyprexa 5 mg p.o. or IM each morning, increase evening dose of Zyprexa to 10 mg p.o. or IM.Marland Kitchen  Recommend avoid use of Geodon. Inpatient psychiatric treatment recommended however patient difficult to place related to Covid positive status.  Patrcia Dolly, FNP 01/19/2020, 10:17 AM

## 2020-01-19 NOTE — ED Notes (Signed)
NT attempting to get vitals while giving lunch. Patient is not easily re-directed and argues about medications and vital signs

## 2020-01-20 MED ORDER — STERILE WATER FOR INJECTION IJ SOLN
INTRAMUSCULAR | Status: AC
  Administered 2020-01-20: 22:00:00 10 mL
  Filled 2020-01-20: qty 10

## 2020-01-20 MED ORDER — STERILE WATER FOR INJECTION IJ SOLN
INTRAMUSCULAR | Status: AC
  Administered 2020-01-20: 10:00:00 10 mL
  Filled 2020-01-20: qty 10

## 2020-01-20 NOTE — Progress Notes (Signed)
CSW faxed [facility] referral to [phone] and completed verbal screening. CSW will continue to follow for placement.   Ruthann Cancer MSW, [employer] Clincal Social Worker  [facility] Ph: [phone] Fax: [phone]

## 2020-01-20 NOTE — Progress Notes (Signed)
CSW received a call from [facility] Disposition stating that:  1.  Per [facility], pt's faxed referral was not received. 2. Reuesting CSW attempt to find referral and resend. 3. Requesting CSW fax pt's IVC paperwork to [facility] Disposition at fax: [phone].  CSW will continue to follow for D/C needs.  Dorothe Pea. Cordarrell Sane  MSW, LCSW, LCAS, CSI Transitions of Care Clinical Social Worker Care Coordination Department Ph: [phone]

## 2020-01-20 NOTE — ED Notes (Signed)
Sleeping, quiet. Since receiving IM Zyprexa.

## 2020-01-20 NOTE — ED Notes (Signed)
Approached her for HS med, which is Zyprexa. She refused and went on at length with psychotic and disorganized speech. Told her if she did not take her po med I would come back with an injection. She said it was on me if I did. Returned with show of force. She accepted the injection without difficulty but told me not to talk to her and just do what I had to do. She said she was in charge of everything including me.

## 2020-01-20 NOTE — ED Notes (Signed)
Pt refused VS  

## 2020-01-20 NOTE — ED Provider Notes (Signed)
Emergency Medicine Observation Re-evaluation Note  Sally Wang is a 59 y.o. female, seen on rounds today.  Pt initially presented to the ED for complaints of Medical Clearance Currently, the patient is pending placement.  Physical Exam  BP (!) 161/110 (BP Location: Left Arm)   Pulse 89   Temp 98.4 F (36.9 C) (Oral)   Resp 20   SpO2 99%  Physical Exam Awake and alert. No acute distress.  Even, unlabored respirations.   ED Course / MDM  EKG:EKG Interpretation  Date/Time:  Tuesday Jan 16 2020 16:35:08 EDT Ventricular Rate:  84 PR Interval:  140 QRS Duration: 86 QT Interval:  386 QTC Calculation: 456 R Axis:   -14 Text Interpretation: Normal sinus rhythm Septal infarct , age undetermined Abnormal ECG When compared with ECG of 12/12/2002, Septal infarct , age undetermined is now present Confirmed by Dione Booze ([id number]) on 01/16/2020 11:30:13 PM  Clinical Course as of Jan 19 1418  Tue Jan 16, 2020  0935 Completed assessment, discussed with staff, patient not complying, acutely psychotic, will IVC, will take back to   [RD]    Clinical Course User Index [RD] Milagros Loll, MD   I have reviewed the labs performed to date as well as medications administered while in observation.  Recent changes in the last 24 hours include patient awaiting placement. Plan  Current plan is for placement . Patient is under full IVC at this time.   Maia Plan, MD 01/20/20 1420

## 2020-01-20 NOTE — Progress Notes (Signed)
CSW spoke with Molly Maduro at [facility] who reported that they could not locate any referral/fax sent for this pt and are requesting referral re-faxed to their facility.   CSW left voicemail for [facility] at [phone] to request this paperwork be resent as CSW does not have a copy of this paperwork.    Ruthann Cancer MSW, [employer] Clincal Social Worker  [facility] Ph: [phone] Fax: [phone]

## 2020-01-20 NOTE — Consult Note (Signed)
[facility] Psych ED Progress Note  01/20/2020 10:54 AM Sally Wang  MRN:  [id number] Subjective: 01/20/2020 "I am in everything, I am a psychiatrist too, I am not real and I do not have hypertension, I do not believe in people speaking negative over me." Patient assessed by nurse practitioner.  Patient alert and oriented, does not participate meaningfully in assessment related to hyper religious conversation and tangential conversation. Patient hyperverbal during assessment.  Patient continues to shout "God gave me wonderful and good, obedient and blessed children.  Face is not seen but God is working for the righteous who God creates." Patient continues to refuse to allow staff to reach out to family members.  Patient states "tell my daughter I will call her later this evening." Per attending RN patient refusing medications.   01/19/2020 "this is the day the Reita Cliche has made, rejoice and be glad in it, it is the most fabulous day of my life." Patient answer psychiatrist "I am a psychiatrist too, I am blessed." Patient assessed by nurse practitioner along with Dr. Dwyane Dee.  Patient alert and oriented, patient does not meaningfully participate in assessment.  Patient hyper religious with elated mood at this time.  Patient conversation disorganized.  Patient states "black and white, being a black woman got always made provisions, things work so beautifully." Patient more cooperative with staff today, per patient's attending RN patient compliant with medications at this time.   "I do not have morning sweetie.  I do not feel like talking please, I want to see the head of the facility."  Attempted to assess patient along with Dr. Dwyane Dee.  Patient alert and seated, patient refuses to participate in assessment. Per attending RN, patient's behavior bizarre, patient affect labile. Patient continues to refuse medications. Attempted to offer support and encouragement, patient continues to refuse to participate in  assessment.   Principal Problem: Schizoaffective disorder ([place]) Diagnosis:  Principal Problem:   Schizoaffective disorder ([place])  Total Time spent with patient: 30 minutes  Past Psychiatric History: Schizoaffective disorder  Past Medical History:  Past Medical History:  Diagnosis Date  . Hypertension   . Schizoaffective disorder [facility])     Past Surgical History:  Procedure Laterality Date  . CHOLECYSTECTOMY  2006   Family History:  Family History  Problem Relation Age of Onset  . Coronary artery disease Father        died 67 yo  . Diabetes Mother        died 67  . Hypertension Brother    Family Psychiatric  History: Unknown Social History:  Social History   Substance and Sexual Activity  Alcohol Use No     Social History   Substance and Sexual Activity  Drug Use No    Social History   Socioeconomic History  . Marital status: Married    Spouse name: Hoy Morn  . Number of children: Not on file  . Years of education: Not on file  . Highest education level: Not on file  Occupational History  . Occupation: homemaker  Tobacco Use  . Smoking status: Never Smoker  . Smokeless tobacco: Never Used  Substance and Sexual Activity  . Alcohol use: No  . Drug use: No  . Sexual activity: Not on file  Other Topics Concern  . Not on file  Social History Narrative   Husband - [place] -2010 granddaughte      Pets -  Pit bulls #4  Social Determinants of Health   Financial Resource Strain:   . Difficulty of Paying Living Expenses:   Food Insecurity:   . Worried About Programme researcher, broadcasting/film/video in the Last Year:   . Barista in the Last Year:   Transportation Needs:   . Freight forwarder (Medical):   Marland Kitchen Lack of Transportation (Non-Medical):   Physical Activity:   . Days of Exercise per Week:   . Minutes of Exercise per Session:   Stress:   . Feeling of Stress :   Social Connections:   . Frequency of  Communication with Friends and Family:   . Frequency of Social Gatherings with Friends and Family:   . Attends Religious Services:   . Active Member of Clubs or Organizations:   . Attends Banker Meetings:   Marland Kitchen Marital Status:     Sleep: Unable to assess  Appetite:  Unable to assess  Current Medications: Current Facility-Administered Medications  Medication Dose Route Frequency Provider Last Rate Last Admin  . acetaminophen (TYLENOL) tablet 650 mg  650 mg Oral Q4H PRN Milagros Loll, MD      . alum & mag hydroxide-simeth (MAALOX/MYLANTA) 200-200-20 MG/5ML suspension 30 mL  30 mL Oral Q6H PRN Milagros Loll, MD      . amLODipine (NORVASC) tablet 10 mg  10 mg Oral Daily Curatolo, Adam, DO   10 mg at 01/19/20 0816  . hydrochlorothiazide (MICROZIDE) capsule 12.5 mg  12.5 mg Oral Daily Curatolo, Adam, DO   12.5 mg at 01/19/20 0816  . labetalol (NORMODYNE) injection 20 mg  20 mg Intravenous Q2H PRN Mancel Bale, MD      . OLANZapine (ZYPREXA) tablet 10 mg  10 mg Oral QHS Patrcia Dolly, FNP       Or  . OLANZapine (ZYPREXA) injection 10 mg  10 mg Intramuscular QHS Patrcia Dolly, FNP      . OLANZapine (ZYPREXA) tablet 5 mg  5 mg Oral Daily Patrcia Dolly, FNP       Or  . OLANZapine (ZYPREXA) injection 5 mg  5 mg Intramuscular Daily Patrcia Dolly, FNP   5 mg at 01/20/20 1026  . OLANZapine zydis (ZYPREXA) disintegrating tablet 5 mg  5 mg Oral Q8H PRN Milagros Loll, MD      . zolpidem (AMBIEN) tablet 5 mg  5 mg Oral QHS PRN Milagros Loll, MD       Current Outpatient Medications  Medication Sig Dispense Refill  . amLODipine (NORVASC) 5 MG tablet Take 1 tablet (5 mg total) by mouth daily. 30 tablet 0    Lab Results: No results found for this or any previous visit (from the past 48 hour(s)).  Blood Alcohol level:  Lab Results  Component Value Date   ETH <5 02/25/2015    Physical Findings: AIMS:  , ,  ,  ,    CIWA:    COWS:     Musculoskeletal: Strength &  Muscle Tone: within normal limits Gait & Station: normal Patient leans: N/A  Psychiatric Specialty Exam: Physical Exam Vitals and nursing note reviewed.  Constitutional:      Appearance: She is well-developed.  HENT:     Head: Normocephalic.  Cardiovascular:     Rate and Rhythm: Normal rate.  Pulmonary:     Effort: Pulmonary effort is normal.  Neurological:     Mental Status: She is alert and oriented to person, place, and time.  Psychiatric:  Attention and Perception: Attention normal.        Mood and Affect: Mood is elated.        Speech: Speech is tangential.        Behavior: Behavior is cooperative.        Thought Content: Thought content is delusional.        Cognition and Memory: Cognition normal.        Judgment: Judgment is impulsive.     Review of Systems  Constitutional: Negative.   HENT: Negative.   Eyes: Negative.   Respiratory: Negative.   Cardiovascular: Negative.   Gastrointestinal: Negative.   Genitourinary: Negative.   Musculoskeletal: Negative.   Skin: Negative.   Neurological: Negative.   Psychiatric/Behavioral: Positive for behavioral problems. The patient is hyperactive.     Blood pressure (!) 161/110, pulse 89, temperature 98.4 F (36.9 C), temperature source Oral, resp. rate 20, SpO2 99 %.There is no height or weight on file to calculate BMI.  General Appearance: Casual and Fairly Groomed  Eye Contact:  Fair  Speech:  Clear and Coherent and Pressured  Volume:  Normal  Mood:  Euphoric  Affect:  Congruent  Thought Process:  Disorganized and Descriptions of Associations: Tangential  Orientation:  Full (Time, Place, and Person)  Thought Content:  Tangential  Suicidal Thoughts:  No  Homicidal Thoughts:  No  Memory:  Immediate;   Fair  Judgement:  Impaired  Insight:  Lacking  Psychomotor Activity:  Normal  Concentration:  Attention Span: Poor  Recall:  Fair  Fund of Knowledge:  Good  Language:  Good  Akathisia:  No  Handed:  Right   AIMS (if indicated):     Assets:  Communication Skills Desire for Improvement Financial Resources/Insurance Housing Intimacy Leisure Time Physical Health Resilience Social Support Talents/Skills  ADL's:  Intact  Cognition:  WNL  Sleep:         Treatment Plan Summary: Medication management recommend Zyprexa 5 mg p.o. or IM each morning, increase evening dose of Zyprexa to 10 mg p.o. or IM.Marland Kitchen  Recommend avoid use of Geodon. Inpatient psychiatric treatment recommended however patient difficult to place related to Covid positive status. Patient should receive scheduled Zyprexa twice daily.  Patrcia Dolly, FNP 01/20/2020, 10:54 AM

## 2020-01-20 NOTE — ED Notes (Signed)
Pt is sleeping sitting up.

## 2020-01-20 NOTE — ED Notes (Signed)
Pt allowed injection to be given without hands own but she complained the entire time and a show of support was ready should it require hands on.  She sits in her room and watches TV. 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 Refused all po medication including BP medication.  Pt is eating. Ambulatory and taking care of her hygiene.

## 2020-01-21 MED ORDER — OLANZAPINE 10 MG IM SOLR
15.0000 mg | Freq: Every day | INTRAMUSCULAR | Status: DC
  Administered 2020-01-21 – 2020-01-25 (×5): 15 mg via INTRAMUSCULAR
  Filled 2020-01-21 (×5): qty 20

## 2020-01-21 MED ORDER — STERILE WATER FOR INJECTION IJ SOLN
INTRAMUSCULAR | Status: AC
  Filled 2020-01-21: qty 10

## 2020-01-21 MED ORDER — STERILE WATER FOR INJECTION IJ SOLN
INTRAMUSCULAR | Status: AC
  Administered 2020-01-21: 23:00:00 2.1 mL
  Filled 2020-01-21: qty 10

## 2020-01-21 MED ORDER — OLANZAPINE 5 MG PO TABS
15.0000 mg | ORAL_TABLET | Freq: Every day | ORAL | Status: DC
  Filled 2020-01-21 (×2): qty 1

## 2020-01-21 NOTE — ED Notes (Signed)
Came to exterior door to ask for food, requested something "Delicioso" When tech requested she use the ante room door she was argumentative, but she refocused herself and went back to asking for food saying she was starving. She was given food.

## 2020-01-21 NOTE — ED Notes (Signed)
Refused vitals both times asked this shift. She slept for short periods of time, often sitting up, otherwise she watched TV, used bathroom and was restless but not a behavior issue.

## 2020-01-21 NOTE — ED Notes (Signed)
Pt alert, no s/s of distress.  Pt refused VS with NT.  Pt refused VS and PO medication.  Pt was cooperative with 5 mg Zyprexa IM. Pt will not answer or respond to questions

## 2020-01-21 NOTE — ED Notes (Signed)
Awake and came to the door to ask tech appropriately for a cup of coffee with three sugars and creamers.

## 2020-01-21 NOTE — ED Notes (Signed)
Pt refused to respond to RN and yelled at and pointed at RN to get out of room.

## 2020-01-21 NOTE — ED Notes (Signed)
No s/s of distress

## 2020-01-21 NOTE — Progress Notes (Signed)
CSW spoke to [name] at [facility] who reported that pt's referral was still under review for their facility.    Ruthann Cancer MSW, [employer] Clincal Social Worker  [facility] Ph: [phone] Fax: [phone]

## 2020-01-21 NOTE — Consult Note (Signed)
[facility] Psych ED Progress Note  01/21/2020 1:55 PM Sally Wang  MRN:  [id number] Subjective: 01/21/2020 "I am not going to turn the TV down, I am drowning all the evil." Patient assessed by nurse practitioner.  Patient continues to appear paranoid.  Patient speech is tangential in nature, also rapid and pressured.  Patient with repetitive comments, states "there are truths and there are truths, there are truce and there are truths..."  Patient states "what ever you are writing down it is not true, I will not receive any negative spoken over me." Patient appears irritable, states "please do not ever talk to me again." Patient continues to be hyper religious.  Patient states "I have a large church family and that is love, do you understand love?" Patient refuses to continue to participate in assessment at this time. Per attending RN patient has disorganized behavior and refuses care intermittently.    01/20/2020 "I am in everything, I am a psychiatrist too, I am not real and I do not have hypertension, I do not believe in people speaking negative over me." Patient assessed by nurse practitioner.  Patient alert and oriented, does not participate meaningfully in assessment related to hyper religious conversation and tangential conversation. Patient hyperverbal during assessment.  Patient continues to shout "God gave me wonderful and good, obedient and blessed children.  Face is not seen but God is working for the righteous who God creates." Patient continues to refuse to allow staff to reach out to family members.  Patient states "tell my daughter I will call her later this evening." Per attending RN patient refusing medications.   01/19/2020 "this is the day the Shaune Pollack has made, rejoice and be glad in it, it is the most fabulous day of my life." Patient answer psychiatrist "I am a psychiatrist too, I am blessed." Patient assessed by nurse practitioner along with Dr. Lucianne Muss.  Patient alert and oriented,  patient does not meaningfully participate in assessment.  Patient hyper religious with elated mood at this time.  Patient conversation disorganized.  Patient states "black and white, being a black woman got always made provisions, things work so beautifully." Patient more cooperative with staff today, per patient's attending RN patient compliant with medications at this time.   "I do not have morning sweetie.  I do not feel like talking please, I want to see the head of the facility."  Attempted to assess patient along with Dr. Lucianne Muss.  Patient alert and seated, patient refuses to participate in assessment. Per attending RN, patient's behavior bizarre, patient affect labile. Patient continues to refuse medications. Attempted to offer support and encouragement, patient continues to refuse to participate in assessment.   Principal Problem: Schizoaffective disorder ([facility]) Diagnosis:  Principal Problem:   Schizoaffective disorder (HCC)  Total Time spent with patient: 30 minutes  Past Psychiatric History: Schizoaffective disorder  Past Medical History:  Past Medical History:  Diagnosis Date  . Hypertension   . Schizoaffective disorder [facility])     Past Surgical History:  Procedure Laterality Date  . CHOLECYSTECTOMY  2006   Family History:  Family History  Problem Relation Age of Onset  . Coronary artery disease Father        died 80 yo  . Diabetes Mother        died 53  . Hypertension Brother    Family Psychiatric  History: Unknown Social History:  Social History   Substance and Sexual Activity  Alcohol Use No     Social History  Substance and Sexual Activity  Drug Use No    Social History   Socioeconomic History  . Marital status: Married    Spouse name: Hoy Morn  . Number of children: Not on file  . Years of education: Not on file  . Highest education level: Not on file  Occupational History  . Occupation: homemaker  Tobacco Use  . Smoking status: Never Smoker  .  Smokeless tobacco: Never Used  Substance and Sexual Activity  . Alcohol use: No  . Drug use: No  . Sexual activity: Not on file  Other Topics Concern  . Not on file  Social History Narrative   Husband - [place] -2010 granddaughte      Pets -  Pit bulls #4            Social Determinants of Health   Financial Resource Strain:   . Difficulty of Paying Living Expenses:   Food Insecurity:   . Worried About Charity fundraiser in the Last Year:   . Arboriculturist in the Last Year:   Transportation Needs:   . Film/video editor (Medical):   Marland Kitchen Lack of Transportation (Non-Medical):   Physical Activity:   . Days of Exercise per Week:   . Minutes of Exercise per Session:   Stress:   . Feeling of Stress :   Social Connections:   . Frequency of Communication with Friends and Family:   . Frequency of Social Gatherings with Friends and Family:   . Attends Religious Services:   . Active Member of Clubs or Organizations:   . Attends Archivist Meetings:   Marland Kitchen Marital Status:     Sleep: Unable to assess  Appetite:  Unable to assess  Current Medications: Current Facility-Administered Medications  Medication Dose Route Frequency Provider Last Rate Last Admin  . acetaminophen (TYLENOL) tablet 650 mg  650 mg Oral Q4H PRN Lucrezia Starch, MD      . alum & mag hydroxide-simeth (MAALOX/MYLANTA) 200-200-20 MG/5ML suspension 30 mL  30 mL Oral Q6H PRN Lucrezia Starch, MD      . amLODipine (NORVASC) tablet 10 mg  10 mg Oral Daily Curatolo, Adam, DO   10 mg at 01/21/20 0950  . hydrochlorothiazide (MICROZIDE) capsule 12.5 mg  12.5 mg Oral Daily Curatolo, Adam, DO   12.5 mg at 01/21/20 0950  . labetalol (NORMODYNE) injection 20 mg  20 mg Intravenous Q2H PRN Daleen Bo, MD      . OLANZapine [facility]) tablet 15 mg  15 mg Oral QHS Emmaline Kluver, FNP       Or  . OLANZapine (ZYPREXA) injection 15 mg  15 mg Intramuscular QHS Emmaline Kluver,  FNP      . OLANZapine (ZYPREXA) tablet 5 mg  5 mg Oral Daily Emmaline Kluver, FNP       Or  . OLANZapine (ZYPREXA) injection 5 mg  5 mg Intramuscular Daily Emmaline Kluver, FNP   5 mg at 01/21/20 0959  . OLANZapine zydis (ZYPREXA) disintegrating tablet 5 mg  5 mg Oral Q8H PRN Lucrezia Starch, MD      . zolpidem (AMBIEN) tablet 5 mg  5 mg Oral QHS PRN Lucrezia Starch, MD       Current Outpatient Medications  Medication Sig Dispense Refill  . amLODipine (NORVASC) 5 MG tablet Take 1 tablet (5 mg total) by mouth daily. 30 tablet 0  Lab Results: No results found for this or any previous visit (from the past 48 hour(s)).  Blood Alcohol level:  Lab Results  Component Value Date   ETH <5 02/25/2015    Physical Findings: AIMS:  , ,  ,  ,    CIWA:    COWS:     Musculoskeletal: Strength & Muscle Tone: within normal limits Gait & Station: normal Patient leans: N/A  Psychiatric Specialty Exam: Physical Exam Vitals and nursing note reviewed.  Constitutional:      Appearance: She is well-developed.  HENT:     Head: Normocephalic.  Cardiovascular:     Rate and Rhythm: Normal rate.  Pulmonary:     Effort: Pulmonary effort is normal.  Neurological:     Mental Status: She is alert and oriented to person, place, and time.  Psychiatric:        Attention and Perception: Attention normal.        Mood and Affect: Mood is anxious. Affect is labile.        Speech: Speech is rapid and pressured and tangential.        Behavior: Behavior is uncooperative.        Thought Content: Thought content is delusional.        Cognition and Memory: Cognition normal.        Judgment: Judgment is impulsive.     Review of Systems  Constitutional: Negative.   HENT: Negative.   Eyes: Negative.   Respiratory: Negative.   Cardiovascular: Negative.   Gastrointestinal: Negative.   Genitourinary: Negative.   Musculoskeletal: Negative.   Skin: Negative.   Neurological: Negative.    Psychiatric/Behavioral: Positive for behavioral problems. The patient is hyperactive.     Blood pressure (!) 161/110, pulse 89, temperature 98.4 F (36.9 C), temperature source Oral, resp. rate 20, SpO2 99 %.There is no height or weight on file to calculate BMI.  General Appearance: Casual and Fairly Groomed  Eye Contact:  Fair  Speech:  Clear and Coherent and Pressured  Volume:  Normal  Mood:  Euphoric  Affect:  Congruent  Thought Process:  Disorganized and Descriptions of Associations: Tangential  Orientation:  Full (Time, Place, and Person)  Thought Content:  Tangential  Suicidal Thoughts:  No  Homicidal Thoughts:  No  Memory:  Immediate;   Fair  Judgement:  Impaired  Insight:  Lacking  Psychomotor Activity:  Normal  Concentration:  Attention Span: Poor  Recall:  Fair  Fund of Knowledge:  Good  Language:  Good  Akathisia:  No  Handed:  Right  AIMS (if indicated):     Assets:  Communication Skills Desire for Improvement Financial Resources/Insurance Housing Intimacy Leisure Time Physical Health Resilience Social Support Talents/Skills  ADL's:  Intact  Cognition:  WNL  Sleep:         Treatment Plan Summary: Patient discussed with Dr. Jola Babinski. Medication management recommend continue Zyprexa 5 mg p.o. or IM each morning, increase evening dose of Zyprexa to 15 mg p.o. or IM.Marland Kitchen  Recommend avoid use of Geodon. Inpatient psychiatric treatment recommended however patient difficult to place related to Covid positive status. Patient should receive scheduled Zyprexa twice daily.  Patrcia Dolly, FNP 01/21/2020, 1:55 PM

## 2020-01-21 NOTE — ED Notes (Signed)
Appears to be sleeping sitting up on the edge of her bed. Eyes closed, quiet, breathing is regular and unlabored.

## 2020-01-21 NOTE — ED Provider Notes (Signed)
Emergency Medicine Observation Re-evaluation Note  Sally Wang is a 59 y.o. female, seen on rounds today.  Pt initially presented to the ED for complaints of Medical Clearance Currently, the patient is resting comfortably.  Physical Exam  BP (!) 161/110 (BP Location: Left Arm)   Pulse 89   Temp 98.4 F (36.9 C) (Oral)   Resp 20   SpO2 99%  Physical Exam Resting comfortably ED Course / MDM  EKG:EKG Interpretation  Date/Time:  Tuesday Jan 16 2020 16:35:08 EDT Ventricular Rate:  84 PR Interval:  140 QRS Duration: 86 QT Interval:  386 QTC Calculation: 456 R Axis:   -14 Text Interpretation: Normal sinus rhythm Septal infarct , age undetermined Abnormal ECG When compared with ECG of 12/12/2002, Septal infarct , age undetermined is now present Confirmed by Dione Booze ([id number]) on 01/16/2020 11:30:13 PM  Clinical Course as of Jan 20 902  Tue Jan 16, 2020  0935 Completed assessment, discussed with staff, patient not complying, acutely psychotic, will IVC, will take back to   [RD]    Clinical Course User Index [RD] Milagros Loll, MD   I have reviewed the labs performed to date as well as medications administered while in observation.  Recent changes in the last 24 hours include pt has refused vitals, has taken her medications Plan  Current plan is for inpatient tx    Linwood Dibbles, MD 01/21/20 [phone]

## 2020-01-21 NOTE — ED Notes (Signed)
Pt sitting on bed, watching TV. No s/s of distress. Sitter at bedside

## 2020-01-21 NOTE — ED Notes (Signed)
Pt alert, pt refusing VS.  Pt yellling at staff, pointing at staff.

## 2020-01-21 NOTE — ED Notes (Signed)
Pt resting in bed, sleeping at this time. Sitter at bedside

## 2020-01-22 ENCOUNTER — Encounter (HOSPITAL_COMMUNITY): Payer: Self-pay | Admitting: Registered Nurse

## 2020-01-22 MED ORDER — STERILE WATER FOR INJECTION IJ SOLN
INTRAMUSCULAR | Status: AC
  Administered 2020-01-22: 11:00:00 10 mL
  Filled 2020-01-22: qty 10

## 2020-01-22 MED ORDER — STERILE WATER FOR INJECTION IJ SOLN
INTRAMUSCULAR | Status: AC
  Administered 2020-01-22: 22:00:00 10 mL
  Filled 2020-01-22: qty 10

## 2020-01-22 NOTE — ED Notes (Signed)
Refused all po medications.  Zyprexa given IM with show of support - no hands on - pt did not assist in any way or resist in any way.  She is calm, disorganized. Attends to her ADLS and is eating and drinking.

## 2020-01-22 NOTE — BH Assessment (Signed)
BHH Assessment Progress Note This Clinical research associate has faxed updated clinical information to [facility], and at 11:39 Lamar Laundry confirms receipt.  Pt is reportedly awaiting medical review at [facility] at this time; final decision is pending.  Doylene Canning, [place] Behavioral Health Coordinator [phone]

## 2020-01-22 NOTE — Consult Note (Signed)
[facility] Psych ED Progress Note  01/22/2020 10:11 AM Sally Wang  MRN:  [id number]    Subjective:  01/22/20:  "From one therapist to another; how are you doing, I have the right to see how you are doing also.  I hope you are great because my relationship with God and they pertaining to me is every truth, the honest and the beauty.  I really care and have a loving family."   Assessment 01/22/20:   Sally Wang, 59 y.o., female patient seen via tele psych for reassessment and follow up by this provider, Dr. Lucianne Muss; and chart reviewed on 01/22/20.  On evaluation Sally Wang continues to be hyper religious, with pressured speech, that's tangential.  Patient also continues to be disorganized with refusal of treatment at times, easily irritated.  Patient would not answer any questions talking over provider.  Once she finished speaking stated that she has no more time to talk "I've got to go to the bathroom, I don't want to wet my cloths.  You take care."  Patient then got up and went into bathroom and shut door.  Will continue to seek gero psychiatric inpatient treatment.  Patient is receiving Zyprexa Zydis 5 ng Q 8 hr prn agitation; Daily dose increased to Zyprexa 5 mg PO/IM daily; and Zyprexa 15 mg PO/IM at bed time.  Patient has not had an EKG since 01/17/20.  Will order a repeat to rule out QTc prolongation.              01/21/2020 "I am not going to turn the TV down, I am drowning all the evil." Patient assessed by nurse practitioner.  Patient continues to appear paranoid.  Patient speech is tangential in nature, also rapid and pressured.  Patient with repetitive comments, states "there are truths and there are truths, there are truce and there are truths..."  Patient states "what ever you are writing down it is not true, I will not receive any negative spoken over me." Patient appears irritable, states "please do not ever talk to me again." Patient continues to be hyper religious.  Patient states "I have a  large church family and that is love, do you understand love?" Patient refuses to continue to participate in assessment at this time. Per attending RN patient has disorganized behavior and refuses care intermittently.   01/20/2020 "I am in everything, I am a psychiatrist too, I am not real and I do not have hypertension, I do not believe in people speaking negative over me." Patient assessed by nurse practitioner.  Patient alert and oriented, does not participate meaningfully in assessment related to hyper religious conversation and tangential conversation. Patient hyperverbal during assessment.  Patient continues to shout "God gave me wonderful and good, obedient and blessed children.  Face is not seen but God is working for the righteous who God creates." Patient continues to refuse to allow staff to reach out to family members.  Patient states "tell my daughter I will call her later this evening." Per attending RN patient refusing medications.   01/19/2020 "this is the day the Shaune Pollack has made, rejoice and be glad in it, it is the most fabulous day of my life." Patient answer psychiatrist "I am a psychiatrist too, I am blessed." Patient assessed by nurse practitioner along with Dr. Lucianne Muss.  Patient alert and oriented, patient does not meaningfully participate in assessment.  Patient hyper religious with elated mood at this time.  Patient conversation disorganized.  Patient states "black  and white, being a black woman got always made provisions, things work so beautifully." Patient more cooperative with staff today, per patient's attending RN patient compliant with medications at this time.   "I do not have morning sweetie.  I do not feel like talking please, I want to see the head of the facility."  Attempted to assess patient along with Dr. Lucianne Muss.  Patient alert and seated, patient refuses to participate in assessment. Per attending RN, patient's behavior bizarre, patient affect labile. Patient  continues to refuse medications. Attempted to offer support and encouragement, patient continues to refuse to participate in assessment.   Principal Problem: Schizoaffective disorder (HCC) Diagnosis:  Principal Problem:   Schizoaffective disorder (HCC)  Total Time spent with patient: 30 minutes  Past Psychiatric History: Schizoaffective disorder  Past Medical History:  Past Medical History:  Diagnosis Date  . Hypertension   . Schizoaffective disorder [facility])     Past Surgical History:  Procedure Laterality Date  . CHOLECYSTECTOMY  2006   Family History:  Family History  Problem Relation Age of Onset  . Coronary artery disease Father        died 27 yo  . Diabetes Mother        died 74  . Hypertension Brother    Family Psychiatric  History: Unknown Social History:  Social History   Substance and Sexual Activity  Alcohol Use No     Social History   Substance and Sexual Activity  Drug Use No    Social History   Socioeconomic History  . Marital status: Married    Spouse name: [name]  . Number of children: Not on file  . Years of education: Not on file  . Highest education level: Not on file  Occupational History  . Occupation: homemaker  Tobacco Use  . Smoking status: Never Smoker  . Smokeless tobacco: Never Used  Substance and Sexual Activity  . Alcohol use: No  . Drug use: No  . Sexual activity: Not on file  Other Topics Concern  . Not on file  Social History Narrative   Husband - Hydie Langan [address] -1987   [name] -2010 granddaughte      Pets -  Pit bulls #4            Social Determinants of Health   Financial Resource Strain:   . Difficulty of Paying Living Expenses:   Food Insecurity:   . Worried About Programme researcher, broadcasting/film/video in the Last Year:   . Barista in the Last Year:   Transportation Needs:   . Freight forwarder (Medical):   Marland Kitchen Lack of Transportation (Non-Medical):   Physical Activity:   . Days of Exercise per  Week:   . Minutes of Exercise per Session:   Stress:   . Feeling of Stress :   Social Connections:   . Frequency of Communication with Friends and Family:   . Frequency of Social Gatherings with Friends and Family:   . Attends Religious Services:   . Active Member of Clubs or Organizations:   . Attends Banker Meetings:   Marland Kitchen Marital Status:     Sleep: Fair  Appetite:  Fair  Current Medications: Current Facility-Administered Medications  Medication Dose Route Frequency Provider Last Rate Last Admin  . acetaminophen (TYLENOL) tablet 650 mg  650 mg Oral Q4H PRN Milagros Loll, MD      . alum & mag hydroxide-simeth (MAALOX/MYLANTA) 200-200-20 MG/5ML suspension  30 mL  30 mL Oral Q6H PRN Milagros Loll, MD      . amLODipine (NORVASC) tablet 10 mg  10 mg Oral Daily Curatolo, Adam, DO   10 mg at 01/21/20 0950  . hydrochlorothiazide (MICROZIDE) capsule 12.5 mg  12.5 mg Oral Daily Curatolo, Adam, DO   12.5 mg at 01/21/20 0950  . labetalol (NORMODYNE) injection 20 mg  20 mg Intravenous Q2H PRN Mancel Bale, MD      . OLANZapine [facility]) tablet 15 mg  15 mg Oral QHS Patrcia Dolly, FNP       Or  . OLANZapine (ZYPREXA) injection 15 mg  15 mg Intramuscular QHS Patrcia Dolly, FNP   15 mg at 01/21/20 2300  . OLANZapine (ZYPREXA) tablet 5 mg  5 mg Oral Daily Patrcia Dolly, FNP       Or  . OLANZapine (ZYPREXA) injection 5 mg  5 mg Intramuscular Daily Patrcia Dolly, FNP   5 mg at 01/21/20 0959  . OLANZapine zydis (ZYPREXA) disintegrating tablet 5 mg  5 mg Oral Q8H PRN Milagros Loll, MD      . zolpidem (AMBIEN) tablet 5 mg  5 mg Oral QHS PRN Milagros Loll, MD       Current Outpatient Medications  Medication Sig Dispense Refill  . amLODipine (NORVASC) 5 MG tablet Take 1 tablet (5 mg total) by mouth daily. 30 tablet 0    Lab Results: No results found for this or any previous visit (from the past 48 hour(s)).  Blood Alcohol level:  Lab Results  Component Value Date    ETH <5 02/25/2015    Physical Findings: AIMS:  , ,  ,  ,    CIWA:    COWS:     Musculoskeletal: Strength & Muscle Tone: within normal limits Gait & Station: normal Patient leans: N/A  Psychiatric Specialty Exam: Physical Exam Vitals and nursing note reviewed. Exam conducted with a chaperone present (sitter at bedside).  Constitutional:      Appearance: She is well-developed.  HENT:     Head: Normocephalic.  Cardiovascular:     Rate and Rhythm: Normal rate.  Pulmonary:     Effort: Pulmonary effort is normal.  Neurological:     Mental Status: She is alert and oriented to person, place, and time.  Psychiatric:        Attention and Perception: Attention normal.        Mood and Affect: Mood is anxious. Affect is labile.        Speech: Speech is rapid and pressured and tangential.        Behavior: Behavior is uncooperative.        Thought Content: Thought content is paranoid and delusional.        Cognition and Memory: Cognition normal.        Judgment: Judgment is impulsive.     Review of Systems  Constitutional: Negative.   HENT: Negative.   Eyes: Negative.   Respiratory: Negative.   Cardiovascular: Negative.   Gastrointestinal: Negative.   Genitourinary: Negative.   Musculoskeletal: Negative.   Skin: Negative.   Neurological: Negative.   Psychiatric/Behavioral: Positive for behavioral problems. The patient is hyperactive.     Blood pressure (!) 189/106, pulse (!) 112, temperature 98.7 F (37.1 C), temperature source Oral, resp. rate 14, SpO2 98 %.There is no height or weight on file to calculate BMI.  General Appearance: Casual and Fairly Groomed  Eye Contact:  Fair  Speech:  Clear and Coherent and Pressured  Volume:  Normal  Mood:  Euphoric  Affect:  Labile  Thought Process:  Disorganized and Descriptions of Associations: Tangential  Orientation:  Full (Time, Place, and Person)  Thought Content:  Tangential  Suicidal Thoughts:  No  Homicidal Thoughts:  No   Memory:  Immediate;   Fair  Judgement:  Impaired  Insight:  Lacking  Psychomotor Activity:  Normal  Concentration:  Attention Span: Poor  Recall:  Ursina of Knowledge:  Good  Language:  Good  Akathisia:  No  Handed:  Right  AIMS (if indicated):     Assets:  Communication Skills Desire for Improvement Financial Resources/Insurance Housing Intimacy Leisure Time Physical Health Resilience Social Support Talents/Skills  ADL's:  Intact  Cognition:  WNL  Sleep:       Treatment Plan Summary: Medication management  Continue Zyprexa Zydis 5 ng Q 8 hr prn agitation; Daily dose increased to Zyprexa 5 mg PO/IM daily; and Zyprexa 15 mg PO/IM at bed time. Avoid use of Geodon.   Recommend EKG to rule out QTc prolongation, one hasn't been done since 01/17/20.   Continue to seek gero psychiatric inpatient treatment.  There has been difficulty placing patient for inpatient psychiatric treatment related to Covid +  Earnestine Tuohey, NP 01/22/2020, 10:11 AM

## 2020-01-22 NOTE — ED Notes (Signed)
Able to get BP and HR, but pt would not allow the pulse ox or the thermometer. Today she has been calm, pleasant, but continues to refuse po medications.  She allows the injection to be given but does not fight it or require hands on.

## 2020-01-22 NOTE — ED Notes (Signed)
Refused VS and began to get agitated

## 2020-01-22 NOTE — ED Notes (Signed)
Refuses EKG 

## 2020-01-22 NOTE — ED Provider Notes (Signed)
Emergency Medicine Observation Re-evaluation Note  Hilari P Ramaswamy is a 59 y.o. female, seen on rounds today.  Pt initially presented to the ED for complaints of Medical Clearance Currently, the patient is resting, no compaints. BP improving.  Physical Exam  BP (!) 189/106   Pulse (!) 112   Temp 98.7 F (37.1 C) (Oral)   Resp 14   SpO2 98%  Physical Exam  ED Course / MDM  EKG:EKG Interpretation  Date/Time:  Tuesday Jan 16 2020 16:35:08 EDT Ventricular Rate:  84 PR Interval:  140 QRS Duration: 86 QT Interval:  386 QTC Calculation: 456 R Axis:   -14 Text Interpretation: Normal sinus rhythm Septal infarct , age undetermined Abnormal ECG When compared with ECG of 12/12/2002, Septal infarct , age undetermined is now present Confirmed by Dione Booze ([id number]) on 01/16/2020 11:30:13 PM  Clinical Course as of Jan 22 707  Tue Jan 16, 2020  0935 Completed assessment, discussed with staff, patient not complying, acutely psychotic, will IVC, will take back to   [RD]    Clinical Course User Index [RD] Milagros Loll, MD   I have reviewed the labs performed to date as well as medications administered while in observation.  Recent changes in the last 24 hours include nothing . Plan  Current plan is for inpatient treatment, covid positive making placement difficult.    Virgina Norfolk, DO 01/22/20 [phone]

## 2020-01-23 LAB — SARS CORONAVIRUS 2 BY RT PCR (HOSPITAL ORDER, PERFORMED IN ~~LOC~~ HOSPITAL LAB): SARS Coronavirus 2: POSITIVE — AB

## 2020-01-23 MED ORDER — QUETIAPINE FUMARATE 50 MG PO TABS
50.0000 mg | ORAL_TABLET | Freq: Two times a day (BID) | ORAL | Status: DC
  Administered 2020-01-23: 50 mg via ORAL
  Filled 2020-01-23 (×3): qty 1

## 2020-01-23 MED ORDER — STERILE WATER FOR INJECTION IJ SOLN
INTRAMUSCULAR | Status: AC
  Administered 2020-01-23: 11:00:00 10 mL
  Filled 2020-01-23: qty 10

## 2020-01-23 MED ORDER — STERILE WATER FOR INJECTION IJ SOLN
INTRAMUSCULAR | Status: AC
  Administered 2020-01-23: 23:00:00 10 mL
  Filled 2020-01-23: qty 10

## 2020-01-23 NOTE — ED Provider Notes (Signed)
Patient underwent repeat COVID testing today 01-23-2020 to help facilitate placement.  Unfortunately patient continues to test positive.  We will continue holding here [place] long.  Elmer Sow. Pilar Plate, MD [facility] Emergency Medicine [facility] [email or web address]    Sabas Sous, MD 01/23/20 2114

## 2020-01-23 NOTE — ED Notes (Signed)
Pt alert, calm, cooperative in room, no s/s of distress. Pt talking to self. Pt cooperative VS.

## 2020-01-23 NOTE — BH Assessment (Addendum)
BHH Assessment Progress Note  Pt's IVC expires today.  Nelly Rout, MD finds that pt continues meet criteria for IVC, which she has initiated.  IVC documents have been faxed to [facility], and at 14:09 Etta Quill confirms receipt.  He has since faxed Findings and Custody Order to this Clinical research associate.  At 14:49 I called [employer] and spoke to [employer], who took demographic information, agreeing to dispatch law enforcement to fill out Return of Service.  As of this writing, arrival of law enforcement is pending.  Doylene Canning, [place] Behavioral Health Coordinator [phone]

## 2020-01-23 NOTE — ED Provider Notes (Signed)
Emergency Medicine Observation Re-evaluation Note  Sally Wang is a 59 y.o. female, seen on rounds today.  Pt initially presented to the ED for complaints of Medical Clearance Currently, the patient is awaiting inpatient BH placement. No new c/o this AM.   Physical Exam  BP (!) 150/98 (BP Location: Left Arm)   Pulse (!) 107   Temp 98.7 F (37.1 C) (Oral)   Resp 18   SpO2 100%  Physical Exam Pt alert, content, no distress.  ED Course / MDM  EKG:EKG Interpretation  Date/Time:  Tuesday Jan 16 2020 16:35:08 EDT Ventricular Rate:  84 PR Interval:  140 QRS Duration: 86 QT Interval:  386 QTC Calculation: 456 R Axis:   -14 Text Interpretation: Normal sinus rhythm Septal infarct , age undetermined Abnormal ECG When compared with ECG of 12/12/2002, Septal infarct , age undetermined is now present Confirmed by Dione Booze ([id number]) on 01/16/2020 11:30:13 PM  Clinical Course as of Jan 23 935  Tue Jan 16, 2020  0935 Completed assessment, discussed with staff, patient not complying, acutely psychotic, will IVC, will take back to   [RD]    Clinical Course User Index [RD] Milagros Loll, MD   I have reviewed the labs performed to date as well as medications administered while in observation.  Recent changes in the last 24 hours include BH reassessment, and continued attempts at inpatient [facility] placement, potentially [facility].  Plan  Current plan is for [facility] placement.  Patient is under full IVC at this time.   Cathren Laine, MD 01/23/20 [phone]

## 2020-01-23 NOTE — BH Assessment (Addendum)
BHH Assessment Progress Note  At 08:28 this writer called [facility], where pt was referred on Friday 01/19/2020, to check on pt's referral status.  [facility] reports that pt is awaiting medical review.  At the request of Nelly Rout, MD, this writer called back at 10:23 to request contact information for the [facility] medical director to further inquire about pt's referral status.  I spoke to [facility], who took my contact information, agreeing to have someone call me back.  As of this writing, return call is pending.  Doylene Canning, [place] Behavioral Health Coordinator [phone]   Addendum:  At 10:39 Amor, RN calls back from [facility].  Pt is now on their wait list.  Doylene Canning, [place] Behavioral Health Coordinator [phone]

## 2020-01-23 NOTE — Consult Note (Signed)
[facility] Psych ED Progress Note  01/23/2020 3:38 PM Sally Wang  MRN:  [id number]    Subjective:  01/23/2020:  "I'll talk to you tomorrow.  I appreciate all of the care you have given me; you've treated me beyond compassionate; and I know you care for psychiatry and helping restore a person for the family."   Assessment: Sally Wang, 59 y.o., female patient seen via tele psych for psychiatric reassessment by this provider, Dr. Lucianne Muss; and chart reviewed on 01/23/20.  On evaluation Sally Wang continues to be hyper religious, pressured speech, tangential, with disorganization, and rumination.  Will continue to seek gero psychiatric hospitalization.  Will continue current medications.  Will add Seroquel 50 mg Bid for mood stabilization.     01/22/20:  "From one therapist to another; how are you doing, I have the right to see how you are doing also.  I hope you are great because my relationship with God and they pertaining to me is every truth, the honest and the beauty.  I really care and have a loving family."    Sally Wang, 59 y.o., female patient seen via tele psych for reassessment and follow up by this provider, Dr. Lucianne Muss; and chart reviewed on 01/23/20.  On evaluation Sally Wang continues to be hyper religious, with pressured speech, that's tangential.  Patient also continues to be disorganized with refusal of treatment at times, easily irritated.  Patient would not answer any questions talking over provider.  Once she finished speaking stated that she has no more time to talk "I've got to go to the bathroom, I don't want to wet my cloths.  You take care."  Patient then got up and went into bathroom and shut door.  Will continue to seek gero psychiatric inpatient treatment.  Patient is receiving Zyprexa Zydis 5 ng Q 8 hr prn agitation; Daily dose increased to Zyprexa 5 mg PO/IM daily; and Zyprexa 15 mg PO/IM at bed time.  Patient has not had an EKG since 01/17/20.  Will order a repeat to rule out QTc  prolongation.              01/21/2020 "I am not going to turn the TV down, I am drowning all the evil." Patient assessed by nurse practitioner.  Patient continues to appear paranoid.  Patient speech is tangential in nature, also rapid and pressured.  Patient with repetitive comments, states "there are truths and there are truths, there are truce and there are truths..."  Patient states "what ever you are writing down it is not true, I will not receive any negative spoken over me." Patient appears irritable, states "please do not ever talk to me again." Patient continues to be hyper religious.  Patient states "I have a large church family and that is love, do you understand love?" Patient refuses to continue to participate in assessment at this time. Per attending RN patient has disorganized behavior and refuses care intermittently.   01/20/2020 "I am in everything, I am a psychiatrist too, I am not real and I do not have hypertension, I do not believe in people speaking negative over me." Patient assessed by nurse practitioner.  Patient alert and oriented, does not participate meaningfully in assessment related to hyper religious conversation and tangential conversation. Patient hyperverbal during assessment.  Patient continues to shout "God gave me wonderful and good, obedient and blessed children.  Face is not seen but God is working for the righteous who God creates."  Patient continues to refuse to allow staff to reach out to family members.  Patient states "tell my daughter I will call her later this evening." Per attending RN patient refusing medications.   01/19/2020 "this is the day the Shaune Pollack has made, rejoice and be glad in it, it is the most fabulous day of my life." Patient answer psychiatrist "I am a psychiatrist too, I am blessed." Patient assessed by nurse practitioner along with Dr. Lucianne Muss.  Patient alert and oriented, patient does not meaningfully participate in assessment.  Patient  hyper religious with elated mood at this time.  Patient conversation disorganized.  Patient states "black and white, being a black woman got always made provisions, things work so beautifully." Patient more cooperative with staff today, per patient's attending RN patient compliant with medications at this time.   "I do not have morning sweetie.  I do not feel like talking please, I want to see the head of the facility."  Attempted to assess patient along with Dr. Lucianne Muss.  Patient alert and seated, patient refuses to participate in assessment. Per attending RN, patient's behavior bizarre, patient affect labile. Patient continues to refuse medications. Attempted to offer support and encouragement, patient continues to refuse to participate in assessment.   Principal Problem: Schizoaffective disorder (HCC) Diagnosis:  Principal Problem:   Schizoaffective disorder (HCC)  Total Time spent with patient: 30 minutes  Past Psychiatric History: Schizoaffective disorder  Past Medical History:  Past Medical History:  Diagnosis Date  . Hypertension   . Schizoaffective disorder [facility])     Past Surgical History:  Procedure Laterality Date  . CHOLECYSTECTOMY  2006   Family History:  Family History  Problem Relation Age of Onset  . Coronary artery disease Father        died 9 yo  . Diabetes Mother        died 1  . Hypertension Brother    Family Psychiatric  History: Unknown Social History:  Social History   Substance and Sexual Activity  Alcohol Use No     Social History   Substance and Sexual Activity  Drug Use No    Social History   Socioeconomic History  . Marital status: Married    Spouse name: [name]  . Number of children: Not on file  . Years of education: Not on file  . Highest education level: Not on file  Occupational History  . Occupation: homemaker  Tobacco Use  . Smoking status: Never Smoker  . Smokeless tobacco: Never Used  Substance and Sexual Activity  .  Alcohol use: No  . Drug use: No  . Sexual activity: Not on file  Other Topics Concern  . Not on file  Social History Narrative   Husband - Logann Whitebread [address] -1987   [name] -2010 granddaughte      Pets -  Pit bulls #4            Social Determinants of Health   Financial Resource Strain:   . Difficulty of Paying Living Expenses:   Food Insecurity:   . Worried About Programme researcher, broadcasting/film/video in the Last Year:   . Barista in the Last Year:   Transportation Needs:   . Freight forwarder (Medical):   Marland Kitchen Lack of Transportation (Non-Medical):   Physical Activity:   . Days of Exercise per Week:   . Minutes of Exercise per Session:   Stress:   . Feeling of Stress :  Social Connections:   . Frequency of Communication with Friends and Family:   . Frequency of Social Gatherings with Friends and Family:   . Attends Religious Services:   . Active Member of Clubs or Organizations:   . Attends Banker Meetings:   Marland Kitchen Marital Status:     Sleep: Fair  Appetite:  Fair  Current Medications: Current Facility-Administered Medications  Medication Dose Route Frequency Provider Last Rate Last Admin  . acetaminophen (TYLENOL) tablet 650 mg  650 mg Oral Q4H PRN Sally Loll, MD      . alum & mag hydroxide-simeth (MAALOX/MYLANTA) 200-200-20 MG/5ML suspension 30 mL  30 mL Oral Q6H PRN Sally Loll, MD      . amLODipine (NORVASC) tablet 10 mg  10 mg Oral Daily Curatolo, Adam, DO   10 mg at 01/21/20 0950  . hydrochlorothiazide (MICROZIDE) capsule 12.5 mg  12.5 mg Oral Daily Curatolo, Adam, DO   12.5 mg at 01/21/20 0950  . labetalol (NORMODYNE) injection 20 mg  20 mg Intravenous Q2H PRN Mancel Bale, MD      . OLANZapine [facility]) tablet 15 mg  15 mg Oral QHS Sally Dolly, FNP       Or  . OLANZapine (ZYPREXA) injection 15 mg  15 mg Intramuscular QHS Sally Dolly, FNP   15 mg at 01/22/20 2140  . OLANZapine (ZYPREXA) tablet 5 mg  5 mg Oral Daily Sally Dolly, FNP       Or  . OLANZapine (ZYPREXA) injection 5 mg  5 mg Intramuscular Daily Sally Dolly, FNP   5 mg at 01/23/20 1043  . OLANZapine zydis (ZYPREXA) disintegrating tablet 5 mg  5 mg Oral Q8H PRN Sally Loll, MD      . zolpidem (AMBIEN) tablet 5 mg  5 mg Oral QHS PRN Sally Loll, MD       Current Outpatient Medications  Medication Sig Dispense Refill  . amLODipine (NORVASC) 5 MG tablet Take 1 tablet (5 mg total) by mouth daily. 30 tablet 0    Lab Results: No results found for this or any previous visit (from the past 48 hour(s)).  Blood Alcohol level:  Lab Results  Component Value Date   ETH <5 02/25/2015    Physical Findings: AIMS:  , ,  ,  ,    CIWA:    COWS:     Musculoskeletal: Strength & Muscle Tone: within normal limits Gait & Station: normal Patient leans: N/A  Psychiatric Specialty Exam: Physical Exam Vitals and nursing note reviewed. Exam conducted with a chaperone present (sitter at bedside).  Constitutional:      Appearance: She is well-developed.  HENT:     Head: Normocephalic.  Cardiovascular:     Rate and Rhythm: Normal rate.  Pulmonary:     Effort: Pulmonary effort is normal.  Neurological:     Mental Status: She is alert and oriented to person, place, and time.  Psychiatric:        Attention and Perception: Attention normal.        Mood and Affect: Mood is anxious. Affect is labile.        Speech: Speech is rapid and pressured and tangential.        Behavior: Behavior is uncooperative.        Thought Content: Thought content is paranoid and delusional.        Cognition and Memory: Cognition normal.        Judgment: Judgment  is impulsive.     Review of Systems  Constitutional: Negative.   HENT: Negative.   Eyes: Negative.   Respiratory: Negative.   Cardiovascular: Negative.   Gastrointestinal: Negative.   Genitourinary: Negative.   Musculoskeletal: Negative.   Skin: Negative.   Neurological: Negative.    Psychiatric/Behavioral: Positive for behavioral problems. The patient is hyperactive.     Blood pressure (!) 138/93, pulse (!) 105, temperature 97.9 F (36.6 C), temperature source Oral, resp. rate 20, SpO2 99 %.There is no height or weight on file to calculate BMI.  General Appearance: Casual and Fairly Groomed  Eye Contact:  Fair  Speech:  Clear and Coherent and Pressured  Volume:  Normal  Mood:  Euphoric  Affect:  Labile  Thought Process:  Disorganized and Descriptions of Associations: Tangential  Orientation:  Full (Time, Place, and Person)  Thought Content:  Tangential  Suicidal Thoughts:  No  Homicidal Thoughts:  No  Memory:  Immediate;   Fair  Judgement:  Impaired  Insight:  Lacking  Psychomotor Activity:  Normal  Concentration:  Attention Span: Poor  Recall:  [place] of Knowledge:  Good  Language:  Good  Akathisia:  No  Handed:  Right  AIMS (if indicated):     Assets:  Communication Skills Desire for Improvement Financial Resources/Insurance Housing Intimacy Leisure Time Physical Health Resilience Social Support Talents/Skills  ADL's:  Intact  Cognition:  WNL  Sleep:       Treatment Plan Summary: Medication management  Continue Zyprexa Zydis 5 ng Q 8 hr prn agitation; Daily dose increased to Zyprexa 5 mg PO/IM daily; and Zyprexa 15 mg PO/IM at bed time. Avoid use of Geodon.   Recommend EKG to rule out QTc prolongation, one hasn't been done since 01/17/20.    Added Seroquel 50 mg Bid for mood stabilization  Continue to seek gero psychiatric inpatient treatment.  There has been difficulty placing patient for inpatient psychiatric treatment related to Covid +  Imraan Wendell, NP 01/23/2020, 3:38 PM

## 2020-01-24 MED ORDER — STERILE WATER FOR INJECTION IJ SOLN
INTRAMUSCULAR | Status: AC
  Filled 2020-01-24: qty 10

## 2020-01-24 MED ORDER — STERILE WATER FOR INJECTION IJ SOLN
INTRAMUSCULAR | Status: AC
  Administered 2020-01-24: 11:00:00 10 mL
  Filled 2020-01-24: qty 10

## 2020-01-24 NOTE — ED Provider Notes (Signed)
Emergency Medicine Observation Re-evaluation Note  Sally Wang is a 59 y.o. female, seen on rounds today.  Pt initially presented to the ED for complaints of Medical Clearance Currently, the patient is placed in if needed.  Physical Exam  BP (!) 138/93 (BP Location: Left Arm)   Pulse (!) 105   Temp 97.9 F (36.6 C) (Oral)   Resp 20   SpO2 99%  Physical Exam  ED Course / MDM  EKG:EKG Interpretation  Date/Time:  Tuesday Jan 16 2020 16:35:08 EDT Ventricular Rate:  84 PR Interval:  140 QRS Duration: 86 QT Interval:  386 QTC Calculation: 456 R Axis:   -14 Text Interpretation: Normal sinus rhythm Septal infarct , age undetermined Abnormal ECG When compared with ECG of 12/12/2002, Septal infarct , age undetermined is now present Confirmed by Dione Booze ([id number]) on 01/16/2020 11:30:13 PM  Clinical Course as of Jan 23 813  Tue Jan 16, 2020  0935 Completed assessment, discussed with staff, patient not complying, acutely psychotic, will IVC, will take back to   [RD]    Clinical Course User Index [RD] Milagros Loll, MD   I have reviewed the labs performed to date as well as medications administered while in observation.  Recent changes in the last 24 hours include awaiting stabilization with possible placement to inpatient psychiatric facility. Plan  Current plan is for to either be discharged after 10 days or be placed at behavioral health Hospital once her quarantine is c ompleted.    Lorre Nick, MD 01/24/20 [phone]

## 2020-01-24 NOTE — Consult Note (Signed)
[facility] Psych ED Progress Note  01/24/2020 11:45 AM Sally Wang  MRN:  [id number]    Subjective:  01/24/20:  "I'm a psychiatrist myself.  How are you?  As long as you grant quality treatment and you share knowledge of psychiatric treatment you will be blessed." Assessment: [place], 59 y.o., female patient seen via tele psych by this provider, Dr. Dwyane Dee; and chart reviewed on 01/24/20.  On evaluation Sally Wang sitting up in chair looking at TV.  Patient continues to be disorganized with pressured tangential speech; and hyper religious.  Patient did not try to get out of conversation today by making up an excuse to go to bathroom or shower.  Patient was pleasant during conversation.  Will continue current medications and continue to seek gero psychiatric inpatient treatment      01/23/2020:  "I'll talk to you tomorrow.  I appreciate all of the care you have given me; you've treated me beyond compassionate; and I know you care for psychiatry and helping restore a person for the family."   Assessment: Sally Wang, 59 y.o., female patient seen via tele psych for psychiatric reassessment by this provider, Dr. Dwyane Dee; and chart reviewed on 01/24/20.  On evaluation Sally Wang continues to be hyper religious, pressured speech, tangential, with disorganization, and rumination.  Will continue to seek gero psychiatric hospitalization.  Will continue current medications.  Will add Seroquel 50 mg Bid for mood stabilization.     01/22/20:  "From one therapist to another; how are you doing, I have the right to see how you are doing also.  I hope you are great because my relationship with God and they pertaining to me is every truth, the honest and the beauty.  I really care and have a loving family."    Sally Wang, 59 y.o., female patient seen via tele psych for reassessment and follow up by this provider, Dr. Dwyane Dee; and chart reviewed on 01/24/20.  On evaluation Sally Wang continues to be hyper  religious, with pressured speech, that's tangential.  Patient also continues to be disorganized with refusal of treatment at times, easily irritated.  Patient would not answer any questions talking over provider.  Once she finished speaking stated that she has no more time to talk "I've got to go to the bathroom, I don't want to wet my cloths.  You take care."  Patient then got up and went into bathroom and shut door.  Will continue to seek gero psychiatric inpatient treatment.  Patient is receiving Zyprexa Zydis 5 ng Q 8 hr prn agitation; Daily dose increased to Zyprexa 5 mg PO/IM daily; and Zyprexa 15 mg PO/IM at bed time.  Patient has not had an EKG since 01/17/20.  Will order a repeat to rule out QTc prolongation.             Principal Problem: Schizoaffective disorder ([place]) Diagnosis:  Principal Problem:   Schizoaffective disorder ([place])  Total Time spent with patient: 30 minutes  Past Psychiatric History: Schizoaffective disorder  Past Medical History:  Past Medical History:  Diagnosis Date  . Hypertension   . Schizoaffective disorder [facility])     Past Surgical History:  Procedure Laterality Date  . CHOLECYSTECTOMY  2006   Family History:  Family History  Problem Relation Age of Onset  . Coronary artery disease Father        died 28 yo  . Diabetes Mother  died 30  . Hypertension Brother    Family Psychiatric  History: Unknown Social History:  Social History   Substance and Sexual Activity  Alcohol Use No     Social History   Substance and Sexual Activity  Drug Use No    Social History   Socioeconomic History  . Marital status: Married    Spouse name: [name]  . Number of children: Not on file  . Years of education: Not on file  . Highest education level: Not on file  Occupational History  . Occupation: homemaker  Tobacco Use  . Smoking status: Never Smoker  . Smokeless tobacco: Never Used  Substance and Sexual Activity  . Alcohol use: No  . Drug use: No  .  Sexual activity: Not on file  Other Topics Concern  . Not on file  Social History Narrative   Husband - Azilee Pirro [address] -1987   [name] -2010 granddaughte      Pets -  Pit bulls #4            Social Determinants of Health   Financial Resource Strain:   . Difficulty of Paying Living Expenses:   Food Insecurity:   . Worried About Programme researcher, broadcasting/film/video in the Last Year:   . Barista in the Last Year:   Transportation Needs:   . Freight forwarder (Medical):   Marland Kitchen Lack of Transportation (Non-Medical):   Physical Activity:   . Days of Exercise per Week:   . Minutes of Exercise per Session:   Stress:   . Feeling of Stress :   Social Connections:   . Frequency of Communication with Friends and Family:   . Frequency of Social Gatherings with Friends and Family:   . Attends Religious Services:   . Active Member of Clubs or Organizations:   . Attends Banker Meetings:   Marland Kitchen Marital Status:     Sleep: Fair  Appetite:  Fair  Current Medications: Current Facility-Administered Medications  Medication Dose Route Frequency Provider Last Rate Last Admin  . acetaminophen (TYLENOL) tablet 650 mg  650 mg Oral Q4H PRN Milagros Loll, MD      . alum & mag hydroxide-simeth (MAALOX/MYLANTA) 200-200-20 MG/5ML suspension 30 mL  30 mL Oral Q6H PRN Milagros Loll, MD      . amLODipine (NORVASC) tablet 10 mg  10 mg Oral Daily Curatolo, Adam, DO   10 mg at 01/21/20 0950  . hydrochlorothiazide (MICROZIDE) capsule 12.5 mg  12.5 mg Oral Daily Curatolo, Adam, DO   12.5 mg at 01/21/20 0950  . labetalol (NORMODYNE) injection 20 mg  20 mg Intravenous Q2H PRN Mancel Bale, MD      . OLANZapine [facility]) tablet 15 mg  15 mg Oral QHS Patrcia Dolly, FNP       Or  . OLANZapine (ZYPREXA) injection 15 mg  15 mg Intramuscular QHS Patrcia Dolly, FNP   15 mg at 01/23/20 2302  . OLANZapine (ZYPREXA) tablet 5 mg  5 mg Oral Daily Patrcia Dolly, FNP       Or  . OLANZapine  (ZYPREXA) injection 5 mg  5 mg Intramuscular Daily Patrcia Dolly, FNP   5 mg at 01/24/20 1055  . OLANZapine zydis (ZYPREXA) disintegrating tablet 5 mg  5 mg Oral Q8H PRN Milagros Loll, MD      . QUEtiapine (SEROQUEL) tablet 50 mg  50 mg Oral BID Rankin, Shuvon B,  NP   50 mg at 01/23/20 2224  . zolpidem (AMBIEN) tablet 5 mg  5 mg Oral QHS PRN Milagros Loll, MD       Current Outpatient Medications  Medication Sig Dispense Refill  . amLODipine (NORVASC) 5 MG tablet Take 1 tablet (5 mg total) by mouth daily. 30 tablet 0    Lab Results:  Results for orders placed or performed during the hospital encounter of 01/16/20 (from the past 48 hour(s))  SARS Coronavirus 2 by RT PCR (hospital order, performed in [facility] hospital lab) Nasopharyngeal Nasopharyngeal Swab     Status: Abnormal   Collection Time: 01/23/20  4:11 PM   Specimen: Nasopharyngeal Swab  Result Value Ref Range   SARS Coronavirus 2 POSITIVE (A) NEGATIVE    Comment: RESULT CALLED TO, READ BACK BY AND VERIFIED WITH: SMITH,J. RN @2056  01/23/20 BILLINGSLEY,L (NOTE) SARS-CoV-2 target nucleic acids are DETECTED SARS-CoV-2 RNA is generally detectable in upper respiratory specimens  during the acute phase of infection.  Positive results are indicative  of the presence of the identified virus, but do not rule out bacterial infection or co-infection with other pathogens not detected by the test.  Clinical correlation with patient history and  other diagnostic information is necessary to determine patient infection status.  The expected result is negative. Fact Sheet for Patients:   03/24/20  Fact Sheet for Healthcare Providers:   BoilerBrush.com.cy   This test is not yet approved or cleared by the https://pope.com/ FDA and  has been authorized for detection and/or diagnosis of SARS-CoV-2 by FDA under an Emergency Use Authorization (EUA).  This EUA will remain in effect  (meaning this test  can be used) for the duration of  the COVID-19 declaration under Section 564(b)(1) of the Act, 21 U.S.C. section 360-bbb-3(b)(1), unless the authorization is terminated or revoked sooner. Performed at [facility], [address], [place], [place]     Blood Alcohol level:  Lab Results  Component Value Date   ETH <5 02/25/2015    Physical Findings: AIMS:  , ,  ,  ,    CIWA:    COWS:     Musculoskeletal: Strength & Muscle Tone: within normal limits Gait & Station: normal Patient leans: N/A  Psychiatric Specialty Exam: Physical Exam Vitals and nursing note reviewed. Exam conducted with a chaperone present (sitter at bedside).  Constitutional:      Appearance: She is well-developed.  HENT:     Head: Normocephalic.  Cardiovascular:     Rate and Rhythm: Normal rate.  Pulmonary:     Effort: Pulmonary effort is normal.  Neurological:     Mental Status: She is alert and oriented to person, place, and time.  Psychiatric:        Attention and Perception: Attention normal.        Mood and Affect: Mood is anxious. Affect is labile.        Speech: Speech is rapid and pressured and tangential.        Behavior: Behavior is uncooperative.        Thought Content: Thought content is paranoid and delusional.        Cognition and Memory: Cognition normal.        Judgment: Judgment is impulsive.     Review of Systems  Constitutional: Negative.   HENT: Negative.   Eyes: Negative.   Respiratory: Negative.   Cardiovascular: Negative.   Gastrointestinal: Negative.   Genitourinary: Negative.   Musculoskeletal: Negative.  Skin: Negative.   Neurological: Negative.   Psychiatric/Behavioral: Positive for behavioral problems. The patient is hyperactive.     Blood pressure (!) 138/93, pulse (!) 105, temperature 97.9 F (36.6 C), temperature source Oral, resp. rate 20, SpO2 99 %.There is no height or weight on file to calculate BMI.   General Appearance: Casual and Fairly Groomed  Eye Contact:  Fair  Speech:  Clear and Coherent and Pressured  Volume:  Normal  Mood:  Euphoric  Affect:  Labile  Thought Process:  Disorganized and Descriptions of Associations: Tangential  Orientation:  Full (Time, Place, and Person)  Thought Content:  Tangential  Suicidal Thoughts:  No  Homicidal Thoughts:  No  Memory:  Immediate;   Fair  Judgement:  Impaired  Insight:  Lacking  Psychomotor Activity:  Normal  Concentration:  Attention Span: Poor  Recall:  Fair  Fund of Knowledge:  Good  Language:  Good  Akathisia:  No  Handed:  Right  AIMS (if indicated):     Assets:  Communication Skills Desire for Improvement Financial Resources/Insurance Housing Intimacy Leisure Time Physical Health Resilience Social Support Talents/Skills  ADL's:  Intact  Cognition:  WNL  Sleep:       Treatment Plan Summary: Medication management  Continue Zyprexa Zydis 5 ng Q 8 hr prn agitation; Daily dose increased to Zyprexa 5 mg PO/IM daily; and Zyprexa 15 mg PO/IM at bed time. Avoid use of Geodon.   Recommend EKG to rule out QTc prolongation, one hasn't been done since 01/17/20.   Continue Seroquel 50 mg Bid for mood stabilization  Continue to seek gero psychiatric inpatient treatment.  There has been difficulty placing patient for inpatient psychiatric treatment related to Covid +  Shuvon Rankin, NP 01/24/2020, 11:45 AM

## 2020-01-24 NOTE — BH Assessment (Signed)
[facility] Assessment Progress Note  At 13:00 this writer called [facility] and spoke to [place].  She confirms that pt remains on their wait list.  Doylene Canning, MA Behavioral Health Coordinator [phone]

## 2020-01-24 NOTE — ED Notes (Signed)
Pt hostile, yells at nurse, does not want anyone in her room. Rambling, hyper verbal, hyper religious.

## 2020-01-24 NOTE — ED Notes (Signed)
Pt refused EKG. Pt yelled at nurse and pointed at nurse to get out

## 2020-01-24 NOTE — ED Notes (Signed)
Pt calm in room. Pt disorganized, rambling. Pt refusing VS.  Pt refused PO medication.  Pt was compliant with Zprexa 5 mg IM with no force.

## 2020-01-24 NOTE — ED Notes (Signed)
Has been in the bathroom for approx 40 min. Have been waiting to give her HS meds. Went in with tech and three Tax adviser. She asked that I leave her alone and dont talk to her as she did when I worked sat night. She was escorted out of the bathroom per security, sat in the chair on her own and said " do what you need to do" took the two injections without difficulty. Asked if she would like to switch sites for the second shot and she declined. When asked to sit on her bed for the meds she said "aint nothing mine up in here" Her scrub shirt is wet. Tech took in clothes and socks along with a snack. She remains irritable and unpleasant.

## 2020-01-24 NOTE — ED Notes (Signed)
Pt alert, calm, no s/s of distress.  Talking to self.  Responding and talking to NT

## 2020-01-24 NOTE — Progress Notes (Signed)
Received Sally Wang last PM awake in her room with the sitter at the bedside. She remained awake and calm in her room, but refused her PO medication. She was given Zyprexa IM without resistance. Security was present during the IM medication administration. She remained restless in her room at 0226 hrs and eventually drifted off to sleep in the chair for approximately 20- 30  minutes.

## 2020-01-24 NOTE — ED Notes (Signed)
At shift change patient is sleeping in her chair.

## 2020-01-24 NOTE — ED Notes (Signed)
Pt alert. Sitter at bedside.

## 2020-01-25 DIAGNOSIS — F259 Schizoaffective disorder, unspecified: Secondary | ICD-10-CM | POA: Diagnosis not present

## 2020-01-25 MED ORDER — STERILE WATER FOR INJECTION IJ SOLN
INTRAMUSCULAR | Status: AC
  Administered 2020-01-25: 10 mL
  Filled 2020-01-25: qty 10

## 2020-01-25 MED ORDER — BENZTROPINE MESYLATE 0.5 MG PO TABS: 0.5000 mg | ORAL_TABLET | Freq: Two times a day (BID) | ORAL | Status: DC

## 2020-01-25 MED ORDER — HALOPERIDOL 5 MG PO TABS: 5.0000 mg | ORAL_TABLET | Freq: Two times a day (BID) | ORAL | Status: DC

## 2020-01-25 MED ORDER — STERILE WATER FOR INJECTION IJ SOLN
INTRAMUSCULAR | Status: AC
  Filled 2020-01-25: qty 10

## 2020-01-25 NOTE — ED Notes (Signed)
Knocked on the door and stuck head out to ask for something to eat. Tech provided snack for her. She was clear and appropriate with her request.

## 2020-01-25 NOTE — ED Notes (Signed)
This patient refuses to be touched,  I am unable to get a EKG at this time due to refusal.  Will attempt again later today.

## 2020-01-25 NOTE — ED Notes (Signed)
Patient has been pleasant today but she will not take any medication.  She is religiously preoccupied and disorganized.

## 2020-01-25 NOTE — BH Assessment (Signed)
[facility] Assessment Progress Note  At 08:16 this writer called [facility] and spoke to [place].  She confirms that pt remains on their wait list.  Doylene Canning, MA Behavioral Health Coordinator [phone]

## 2020-01-25 NOTE — Progress Notes (Signed)
Received Sally Wang at the change of shift awake in her room sitting in a chair with the sitter at the bedside. She refused her PO medication and was cooperative with the IM Zyprexa. She was awake throughout the night in her room. She remains disorganized and called the nurses station for one hour asking several questions and making demands.

## 2020-01-25 NOTE — Consult Note (Signed)
[facility] Psych ED Progress Note  01/25/2020 11:33 AM LOREE SHEHATA  MRN:  [id number] Subjective:  "I am doing a lot of things today.  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  Principal Problem: Schizoaffective disorder (HCC) Diagnosis:  Principal Problem:   Schizoaffective disorder (HCC)  Total Time spent with patient: 30 minutes   Jan 25, 2020:  Shyrl Numbers, 59 y.o., female seen via telepsych by this provider and Dr. Lucianne Muss and chart reviewed on 01/25/20.  On evaluation Keyanni P Moger is sitting at the bedside table eating breakfast.  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   Her speech is pressure, she demonstrates a flight of ideas and most of her conversation is not reality based.  Several attempts to assess orientation were overshadowed by patient's talkativeness. She is currently taking olanzapine and seroquel, no side effects noted, but the medications do not seem to help her manic symptoms or stabilize her mood.     Collateral information: Spoke with patient's daughter, Florentina Jenny who [phone]), who reports her mother used to take haldol injections several years ago which helped her maniac symptoms.  She states her mother stopped taking haldol, citing religious contraindications.  She also states she would like to be involved in her mother's care and available to provide any additional information needed by the team.   Subjective:  01/24/20:  "I'm a psychiatrist myself.  How are you?  As long as you grant quality treatment and you share knowledge of psychiatric treatment you will be blessed." Assessment: Lekita P Norfleet, 59 y.o., female patient seen via tele psych by this provider, Dr. Lucianne Muss; and chart reviewed on 01/24/20.  On evaluation Nyra P Fruin sitting up in chair looking at TV.  Patient continues  to be disorganized with pressured tangential speech; and hyper religious.  Patient did not try to get out of conversation today by making up an excuse to go to bathroom or shower.  Patient was pleasant during conversation.  Will continue current medications and continue to seek gero psychiatric inpatient treatment      01/23/2020:  "I'll talk to you tomorrow.  I appreciate all of the care you have given me; you've treated me beyond compassionate; and I know you care for psychiatry and helping restore a person for the family."   Assessment: Tanecia P Hopple, 59 y.o., female patient seen via tele psych for psychiatric reassessment by this provider, Dr. Lucianne Muss; and chart reviewed on 01/24/20.  On evaluation Thomasine P Hiley continues to be hyper religious, pressured speech, tangential, with disorganization, and rumination.  Will continue to seek gero psychiatric hospitalization.  Will continue current medications.  Will add Seroquel 50 mg Bid for mood stabilization.     Past Psychiatric History:   Past Medical History:  Past Medical History:  Diagnosis Date  . Hypertension   . Schizoaffective disorder [facility])     Past Surgical History:  Procedure Laterality Date  . CHOLECYSTECTOMY  2006   Family History:  Family History  Problem Relation Age of Onset  . Coronary artery disease Father        died 47 yo  . Diabetes Mother        died 13  . Hypertension Brother    Family Psychiatric  History: unknown Social History:  Social History   Substance and Sexual Activity  Alcohol Use No     Social History   Substance and Sexual Activity  Drug Use No    Social History   Socioeconomic History  . Marital status: Married    Spouse name: [name]  . Number of children: Not on file  . Years of education: Not on file  . Highest education level: Not on file  Occupational History  . Occupation: homemaker  Tobacco Use  . Smoking status: Never Smoker  . Smokeless tobacco: Never Used  Substance and  Sexual Activity  . Alcohol use: No  . Drug use: No  . Sexual activity: Not on file  Other Topics Concern  . Not on file  Social History Narrative   Husband - Tiffiany Beadles [address] -1987   [name] -2010 granddaughte      Pets -  Pit bulls #4            Social Determinants of Health   Financial Resource Strain:   . Difficulty of Paying Living Expenses:   Food Insecurity:   . Worried About Programme researcher, broadcasting/film/video in the Last Year:   . Barista in the Last Year:   Transportation Needs:   . Freight forwarder (Medical):   Marland Kitchen Lack of Transportation (Non-Medical):   Physical Activity:   . Days of Exercise per Week:   . Minutes of Exercise per Session:   Stress:   . Feeling of Stress :   Social Connections:   . Frequency of Communication with Friends and Family:   . Frequency of Social Gatherings with Friends and Family:   . Attends Religious Services:   . Active Member of Clubs or Organizations:   . Attends Banker Meetings:   Marland Kitchen Marital Status:     Sleep: Fair  Appetite:  Fair  Current Medications: Current Facility-Administered Medications  Medication Dose Route Frequency Provider Last Rate Last Admin  . acetaminophen (TYLENOL) tablet 650 mg  650 mg Oral Q4H PRN Milagros Loll, MD      . alum & mag hydroxide-simeth (MAALOX/MYLANTA) 200-200-20 MG/5ML suspension 30 mL  30 mL Oral Q6H PRN Milagros Loll, MD      . amLODipine (NORVASC) tablet 10 mg  10 mg Oral Daily Curatolo, Adam, DO   10 mg at 01/21/20 0950  . benztropine (COGENTIN) tablet 0.5 mg  0.5 mg Oral BID Ophelia Shoulder E, NP      . haloperidol (HALDOL) tablet 5 mg  5 mg Oral BID Ophelia Shoulder E, NP      . hydrochlorothiazide (MICROZIDE) capsule 12.5 mg  12.5 mg Oral Daily Curatolo, Adam, DO   12.5 mg at 01/21/20 0950  . labetalol (NORMODYNE) injection 20 mg  20 mg Intravenous Q2H PRN Mancel Bale, MD      . OLANZapine [facility]) tablet 15 mg  15 mg Oral QHS Patrcia Dolly, FNP        Or  . OLANZapine (ZYPREXA) injection 15 mg  15 mg Intramuscular QHS Patrcia Dolly, FNP   15 mg at 01/24/20 2213  . OLANZapine (ZYPREXA) tablet 5 mg  5 mg Oral Daily Patrcia Dolly, FNP       Or  . OLANZapine (ZYPREXA) injection 5 mg  5 mg Intramuscular Daily Patrcia Dolly, FNP   5 mg at 01/24/20 1055  . OLANZapine zydis (ZYPREXA) disintegrating tablet 5 mg  5 mg Oral Q8H PRN  Milagros Loll, MD      . zolpidem [facility]) tablet 5 mg  5 mg Oral QHS PRN Milagros Loll, MD       Current Outpatient Medications  Medication Sig Dispense Refill  . amLODipine (NORVASC) 5 MG tablet Take 1 tablet (5 mg total) by mouth daily. 30 tablet 0    Lab Results:  Results for orders placed or performed during the hospital encounter of 01/16/20 (from the past 48 hour(s))  SARS Coronavirus 2 by RT PCR (hospital order, performed in [facility] hospital lab) Nasopharyngeal Nasopharyngeal Swab     Status: Abnormal   Collection Time: 01/23/20  4:11 PM   Specimen: Nasopharyngeal Swab  Result Value Ref Range   SARS Coronavirus 2 POSITIVE (A) NEGATIVE    Comment: RESULT CALLED TO, READ BACK BY AND VERIFIED WITH: SMITH,J. RN @2056  01/23/20 BILLINGSLEY,L (NOTE) SARS-CoV-2 target nucleic acids are DETECTED SARS-CoV-2 RNA is generally detectable in upper respiratory specimens  during the acute phase of infection.  Positive results are indicative  of the presence of the identified virus, but do not rule out bacterial infection or co-infection with other pathogens not detected by the test.  Clinical correlation with patient history and  other diagnostic information is necessary to determine patient infection status.  The expected result is negative. Fact Sheet for Patients:   03/24/20  Fact Sheet for Healthcare Providers:   BoilerBrush.com.cy   This test is not yet approved or cleared by the https://pope.com/ FDA and  has been authorized for detection and/or  diagnosis of SARS-CoV-2 by FDA under an Emergency Use Authorization (EUA).  This EUA will remain in effect (meaning this test  can be used) for the duration of  the COVID-19 declaration under Section 564(b)(1) of the Act, 21 U.S.C. section 360-bbb-3(b)(1), unless the authorization is terminated or revoked sooner. Performed at [facility], [address] [place], [place]     Blood Alcohol level:  Lab Results  Component Value Date   ETH <5 02/25/2015    Physical Findings: AIMS:  , ,  ,  ,    CIWA:    COWS:     Musculoskeletal: Strength & Muscle Tone: did not assess via telepsych Gait & Station: did not assess, patient sitting in the chair Patient leans: did not assess, patient sitting in the chair  Psychiatric Specialty Exam: Physical Exam  Review of Systems  Psychiatric/Behavioral: Positive for sleep disturbance. The patient is hyperactive.     Blood pressure (!) 138/93, pulse (!) 105, temperature 97.9 F (36.6 C), temperature source Oral, resp. rate 20, SpO2 99 %.There is no height or weight on file to calculate BMI.  General Appearance: Bizarre, Fairly Groomed and sitting in bedside chair, not directly facing the camera and very talkative  Eye Contact:  Minimal  Speech:  Clear and Coherent and Pressured  Volume:  Normal  Mood:  Euphoric  Affect:  Congruent  Thought Process:  Descriptions of Associations: Tangential  Orientation:  Other:  unable to assess  Thought Content:  Illogical and Tangential  Suicidal Thoughts:  unable to assess  Homicidal Thoughts:  unable to assess  Memory:  unable to assess  Judgement:  Impaired  Insight:  Lacking  Psychomotor Activity:  Increased  Concentration:  Concentration: Poor and Attention Span: Poor  Recall:  unable to assess  Fund of Knowledge:  unable to assess  Language:  Fair  Akathisia:  NA  Handed:  Right  AIMS (if indicated):  Assets:  Housing Social Support  ADL's:  Intact   Cognition:  Impaired,  Moderate  Sleep:   impaired      Treatment Plan Summary:  Continue to seek gero psychiatric inpatient treatment. SW has already faxed patient out seeking accepting facilities.  She is COVID 19+ since May 4th but asymptomatic.  Per CDC guidelines, patient should remain in isolation for 10 days.  If she remains asymptomatic, can consider transfer to [facility] on 5/14.     EKG ordered to evaluate for QTC prolongation  Medication management  Continue Olanzapine 15mg  PO or IM BID for psychosis                 Olanzapine 5mg  PO or IM daily for psychosis  Add: Haldol 5mg  PO BID for mood stabilization           Cogentin 0.5mg  PO BID for EPS Discontinue Seroquel  Avoid use of Geodon.    Mallie Darting, NP 01/25/2020, 11:33 AM

## 2020-01-26 ENCOUNTER — Inpatient Hospital Stay (HOSPITAL_COMMUNITY)
Admission: AD | Admit: 2020-01-26 | Discharge: 2020-02-05 | DRG: 885 | Disposition: A | Discharge status: Home / Self Care | Payer: Medicare HMO | Attending: Psychiatry | Admitting: Psychiatry

## 2020-01-26 ENCOUNTER — Other Ambulatory Visit: Payer: Self-pay

## 2020-01-26 ENCOUNTER — Encounter (HOSPITAL_COMMUNITY): Payer: Self-pay | Admitting: Registered Nurse

## 2020-01-26 DIAGNOSIS — F259 Schizoaffective disorder, unspecified: Principal | ICD-10-CM | POA: Diagnosis present

## 2020-01-26 DIAGNOSIS — F209 Schizophrenia, unspecified: Secondary | ICD-10-CM | POA: Diagnosis not present

## 2020-01-26 DIAGNOSIS — Z8249 Family history of ischemic heart disease and other diseases of the circulatory system: Secondary | ICD-10-CM

## 2020-01-26 DIAGNOSIS — F29 Unspecified psychosis not due to a substance or known physiological condition: Secondary | ICD-10-CM | POA: Diagnosis not present

## 2020-01-26 DIAGNOSIS — Z9119 Patient's noncompliance with other medical treatment and regimen: Secondary | ICD-10-CM

## 2020-01-26 DIAGNOSIS — E119 Type 2 diabetes mellitus without complications: Secondary | ICD-10-CM | POA: Diagnosis not present

## 2020-01-26 DIAGNOSIS — I10 Essential (primary) hypertension: Secondary | ICD-10-CM | POA: Diagnosis not present

## 2020-01-26 DIAGNOSIS — Z8616 Personal history of COVID-19: Secondary | ICD-10-CM | POA: Diagnosis not present

## 2020-01-26 DIAGNOSIS — Z833 Family history of diabetes mellitus: Secondary | ICD-10-CM

## 2020-01-26 DIAGNOSIS — F250 Schizoaffective disorder, bipolar type: Secondary | ICD-10-CM | POA: Diagnosis not present

## 2020-01-26 DIAGNOSIS — U071 COVID-19: Secondary | ICD-10-CM | POA: Diagnosis not present

## 2020-01-26 DIAGNOSIS — G4700 Insomnia, unspecified: Secondary | ICD-10-CM | POA: Diagnosis not present

## 2020-01-26 DIAGNOSIS — Z79899 Other long term (current) drug therapy: Secondary | ICD-10-CM | POA: Diagnosis not present

## 2020-01-26 MED ORDER — OLANZAPINE 5 MG PO TBDP: 5.0000 mg | ORAL_TABLET | Freq: Three times a day (TID) | ORAL | Status: DC | PRN

## 2020-01-26 MED ORDER — AMLODIPINE BESYLATE 5 MG PO TABS
5.0000 mg | ORAL_TABLET | Freq: Every day | ORAL | Status: DC
  Administered 2020-01-26 – 2020-01-27 (×2): 5 mg via ORAL
  Filled 2020-01-26 (×5): qty 1

## 2020-01-26 MED ORDER — TRAZODONE HCL 50 MG PO TABS
50.0000 mg | ORAL_TABLET | Freq: Every evening | ORAL | Status: DC | PRN
  Filled 2020-01-26: qty 1

## 2020-01-26 MED ORDER — ACETAMINOPHEN 325 MG PO TABS: 650.0000 mg | ORAL_TABLET | ORAL | Status: DC | PRN

## 2020-01-26 MED ORDER — ALUM & MAG HYDROXIDE-SIMETH 200-200-20 MG/5ML PO SUSP: 30.0000 mL | Freq: Four times a day (QID) | ORAL | Status: DC | PRN

## 2020-01-26 MED ORDER — BENZTROPINE MESYLATE 0.5 MG PO TABS
0.5000 mg | ORAL_TABLET | Freq: Two times a day (BID) | ORAL | Status: DC
  Administered 2020-01-26 – 2020-01-28 (×4): 0.5 mg via ORAL
  Filled 2020-01-26 (×9): qty 1

## 2020-01-26 MED ORDER — LORAZEPAM 1 MG PO TABS: 2.0000 mg | ORAL_TABLET | Freq: Four times a day (QID) | ORAL | Status: DC | PRN

## 2020-01-26 MED ORDER — LORAZEPAM 1 MG PO TABS: 1.0000 mg | ORAL_TABLET | ORAL | Status: DC | PRN

## 2020-01-26 MED ORDER — HALOPERIDOL 5 MG PO TABS: 5.0000 mg | ORAL_TABLET | Freq: Four times a day (QID) | ORAL | Status: DC | PRN

## 2020-01-26 MED ORDER — DIPHENHYDRAMINE HCL 50 MG/ML IJ SOLN: 50.0000 mg | Freq: Four times a day (QID) | INTRAMUSCULAR | Status: DC | PRN

## 2020-01-26 MED ORDER — HALOPERIDOL LACTATE 5 MG/ML IJ SOLN: 10.0000 mg | Freq: Four times a day (QID) | INTRAMUSCULAR | Status: DC | PRN

## 2020-01-26 MED ORDER — BENZTROPINE MESYLATE 1 MG/ML IJ SOLN
0.5000 mg | Freq: Two times a day (BID) | INTRAMUSCULAR | Status: DC
  Filled 2020-01-26 (×8): qty 0.5

## 2020-01-26 MED ORDER — HYDROCHLOROTHIAZIDE 12.5 MG PO CAPS
12.5000 mg | ORAL_CAPSULE | Freq: Every day | ORAL | Status: DC
  Administered 2020-01-26 – 2020-02-02 (×8): 12.5 mg via ORAL
  Filled 2020-01-26 (×11): qty 1

## 2020-01-26 MED ORDER — HALOPERIDOL LACTATE 5 MG/ML IJ SOLN
5.0000 mg | Freq: Two times a day (BID) | INTRAMUSCULAR | Status: DC
  Filled 2020-01-26 (×6): qty 1

## 2020-01-26 MED ORDER — LORAZEPAM 2 MG/ML IJ SOLN: 2.0000 mg | Freq: Four times a day (QID) | INTRAMUSCULAR | Status: DC | PRN

## 2020-01-26 MED ORDER — ZIPRASIDONE MESYLATE 20 MG IM SOLR: 20.0000 mg | INTRAMUSCULAR | Status: DC | PRN

## 2020-01-26 MED ORDER — STERILE WATER FOR INJECTION IJ SOLN
INTRAMUSCULAR | Status: AC
  Administered 2020-01-26: 2.1 mL
  Filled 2020-01-26: qty 10

## 2020-01-26 MED ORDER — DIPHENHYDRAMINE HCL 25 MG PO CAPS
50.0000 mg | ORAL_CAPSULE | Freq: Four times a day (QID) | ORAL | Status: DC | PRN
  Administered 2020-01-26: 50 mg via ORAL
  Filled 2020-01-26: qty 2

## 2020-01-26 MED ORDER — HALOPERIDOL 5 MG PO TABS
5.0000 mg | ORAL_TABLET | Freq: Two times a day (BID) | ORAL | Status: DC
  Administered 2020-01-26 – 2020-01-27 (×2): 5 mg via ORAL
  Filled 2020-01-26 (×7): qty 1

## 2020-01-26 NOTE — ED Notes (Signed)
Transported to [facility] by [employer].  One bag of belongings given to [employer]. Pt was cooperative at the time of discharge.

## 2020-01-26 NOTE — BH Assessment (Signed)
[facility] Assessment Progress Note  At 08:25 this writer called [facility] and spoke to [place]. She confirms that pt remains on their wait list.  Doylene Canning, MA Behavioral Health Coordinator [phone]

## 2020-01-26 NOTE — BH Assessment (Addendum)
[facility] Assessment Progress Note  Per Nelly Rout, MD, this pt requires psychiatric hospitalization.  Jasmine has assigned pt to [facility] Rm 504-1; [facility] will be ready to receive pt at 16:00.  Pt presents under IVC initiated by Nelly Rout, MD, and IVC documents have been faxed to [facility].  Pt's nurse, Diane, has been notified, and agrees to call report to [phone].  Pt is to be transported via Patent examiner.   Doylene Canning, [place] Behavioral Health Coordinator [phone]

## 2020-01-26 NOTE — Progress Notes (Signed)
Patient ID: Sally Wang, female   DOB: August 05, 1961, 59 y.o.   MRN: [id number] Admission Note  Pt is a 60 yo female that presents IVC'd on 01/26/2020 with worsening religiosity, med non-compliance, and tangential speech. Pt is disorganized in the assessment and can't describe what brought them to the hospital. Pt's conversation continues to be tangential and religiously preoccupied. Pt was willing to sign paper work, but signed a different name to each paper. Pt declined to take their vital signs. Pt was willing to take dinner time medications orally with verbal encouragement. Pt states they are taking them because this is what God wants. Pt's lower extremities seem like they may have pitting edema, but the pt declined for this writer to assess them. Pt stated they weren't in pain, and the pt didn't seem in distress regarding the edema. Pt is pleasant but reclusive to their room. Pt denies any si/hi/ah/vh and verbally agrees to approach staff if these become apparent or before harming themself/others while at bhh. Consents signed, skin/belongings search completed and patient oriented to unit. Patient stable at this time. Patient given the opportunity to express concerns and ask questions. Patient given toiletries. Will continue to monitor.

## 2020-01-26 NOTE — ED Provider Notes (Signed)
Emergency Medicine Observation Re-evaluation Note  Sally Wang is a 59 y.o. female, seen on rounds today.  Pt initially presented to the ED for complaints of Medical Clearance Currently, the patient is more calm.  Patient is now 10 days since positive Covid test without symptoms.  Patient is cleared per CDC recommendations for not being contagious..  Physical Exam  BP (!) 159/100 (BP Location: Left Arm)   Pulse 98   Temp 98 F (36.7 C) (Oral)   Resp 20   SpO2 98%  Physical Exam  ED Course / MDM  EKG:EKG Interpretation  Date/Time:  Tuesday Jan 16 2020 16:35:08 EDT Ventricular Rate:  84 PR Interval:  140 QRS Duration: 86 QT Interval:  386 QTC Calculation: 456 R Axis:   -14 Text Interpretation: Normal sinus rhythm Septal infarct , age undetermined Abnormal ECG When compared with ECG of 12/12/2002, Septal infarct , age undetermined is now present Confirmed by Dione Booze ([id number]) on 01/16/2020 11:30:13 PM  Clinical Course as of Jan 25 753  Tue Jan 16, 2020  0935 Completed assessment, discussed with staff, patient not complying, acutely psychotic, will IVC, will take back to   [RD]    Clinical Course User Index [RD] Milagros Loll, MD   I have reviewed the labs performed to date as well as medications administered while in observation.  Recent changes in the last 24 hours include none. Plan  Current plan is for inpatient treatment. Patient is not under full IVC at this time.   Lorre Nick, MD 01/26/20 [phone]

## 2020-01-26 NOTE — ED Notes (Signed)
Report given to Casimiro Needle at [facility] and [employer] called for transport.  Pt told that she is being transferred to [facility] and she said, "No I'm not."  This Clinical research associate asked her if she would like to call any family members to inform them of the change and said, "Oh, my family knows exactly what is going on here."

## 2020-01-26 NOTE — Tx Team (Signed)
Initial Treatment Plan 01/26/2020 5:38 PM Sally Wang [id number]    PATIENT STRESSORS: Health problems Medication change or noncompliance   PATIENT STRENGTHS: Supportive family/friends   PATIENT IDENTIFIED PROBLEMS: tangential  Medication noncompliance  Religiously preoccupied                  DISCHARGE CRITERIA:  Ability to meet basic life and health needs Improved stabilization in mood, thinking, and/or behavior Motivation to continue treatment in a less acute level of care Need for constant or close observation no longer present  PRELIMINARY DISCHARGE PLAN: Attend aftercare/continuing care group Outpatient therapy Return to previous living arrangement  PATIENT/FAMILY INVOLVEMENT: This treatment plan has been presented to and reviewed with the patient, Sally Wang.  The patient and family have been given the opportunity to ask questions and make suggestions.  Raylene Miyamoto, RN 01/26/2020, 5:38 PM

## 2020-01-26 NOTE — ED Notes (Signed)
Continues to refuse medications and nursing interventions. IM medication give with show of support. Pt will not otherwise allow it. She will not take responsibility for taking it saying, "It is on you."  She was severed legal papers by [employer] informing her that she has been deemed by the courts to be incompetent at this time and in need of a legal guardian.  This Clinical research associate does not know if she understood the papers or not. She did not request interpretation.

## 2020-01-27 DIAGNOSIS — F259 Schizoaffective disorder, unspecified: Principal | ICD-10-CM

## 2020-01-27 LAB — HEMOGLOBIN A1C
Hgb A1c MFr Bld: 6.4 % — ABNORMAL HIGH (ref 4.8–5.6)
Mean Plasma Glucose: 136.98 mg/dL

## 2020-01-27 LAB — LIPID PANEL
Cholesterol: 163 mg/dL (ref 0–200)
HDL: 47 mg/dL (ref >40)
LDL Cholesterol: 107 mg/dL — ABNORMAL HIGH (ref 0–99)
Total CHOL/HDL Ratio: 3.5 RATIO
Triglycerides: 46 mg/dL (ref <150)
VLDL: 9 mg/dL (ref 0–40)

## 2020-01-27 LAB — TSH: TSH: 1.282 u[IU]/mL (ref 0.350–4.500)

## 2020-01-27 MED ORDER — HALOPERIDOL LACTATE 5 MG/ML IJ SOLN
10.0000 mg | Freq: Two times a day (BID) | INTRAMUSCULAR | Status: DC
  Filled 2020-01-27 (×6): qty 2

## 2020-01-27 MED ORDER — HALOPERIDOL 5 MG PO TABS
10.0000 mg | ORAL_TABLET | Freq: Two times a day (BID) | ORAL | Status: DC
  Administered 2020-01-27 – 2020-01-28 (×2): 10 mg via ORAL
  Filled 2020-01-27 (×6): qty 2

## 2020-01-27 MED ORDER — AMLODIPINE BESYLATE 10 MG PO TABS
10.0000 mg | ORAL_TABLET | Freq: Every day | ORAL | Status: DC
  Administered 2020-01-28 – 2020-02-05 (×9): 10 mg via ORAL
  Filled 2020-01-27 (×11): qty 1

## 2020-01-27 NOTE — Progress Notes (Signed)
   01/26/20 2140  COVID-19 Daily Checkoff  Have you had a fever (temp > 37.80C/100F)  in the past 24 hours?  No  If you have had runny nose, nasal congestion, sneezing in the past 24 hours, has it worsened? No  COVID-19 EXPOSURE  Have you traveled outside the state in the past 14 days? No  Have you been in contact with someone with a confirmed diagnosis of COVID-19 or PUI in the past 14 days without wearing appropriate PPE? No  Have you been diagnosed with COVID-19? No

## 2020-01-27 NOTE — Progress Notes (Signed)
Pt denied HI/SI/AVH. Pt presented with pressured and tangential speech. Pt is hyper-focused on religion.  Pt took medications without incident. RN encouraged pt and validated her concerns.  Pt is resting at this time.  RN will continue to monitor and provide support as needed.

## 2020-01-27 NOTE — [facility] Group Notes (Signed)
[facility] Group Notes: (Clinical Social Work)   01/27/2020      Type of Therapy:  Group Therapy   Participation Level:  Did Not Attend - was invited individually by Nurse or MHT and chose not to attend.   Sharnika Binney D Avaline Stillson, LCSWA 01/27/2020  5:24 PM       

## 2020-01-27 NOTE — Progress Notes (Signed)
Patient sitting on her bed praying when writer entered her room with her mediations. She refused vitals being taken after 2 attempts. She reports that she does not need any medicines and she does not have high blood pressure. After several attempts she took her scheduled hs medications. Patient remains religiously preoccupied. Safety maintained on unit with 15 min checks.

## 2020-01-27 NOTE — Progress Notes (Signed)
Patient up in room pacing and used call bell to request that we go in her locker and get her shawl. When informed her we are unable to she became irritated and ask that we leave her room.

## 2020-01-27 NOTE — H&P (Signed)
Psychiatric Admission Assessment Adult  Patient Identification: Sally Wang MRN:  [id number] Date of Evaluation:  01/27/2020 Chief Complaint:  Schizoaffective disorder (HCC) [F25.9] Principal Diagnosis: Schizoaffective disorder (HCC) Diagnosis:  Principal Problem:   Schizoaffective disorder (HCC)  History of Present Illness: Per MD SRA: Patient is seen and examined.  Patient is a 59 year old female with a reported past psychiatric history significant for schizoaffective disorder who originally presented to the [facility] emergency department on 01/20/2020.  She was noted at that time to be pressured, hyper religious and tangential.  She was started on Zyprexa.  Unfortunately patient was COVID-19 positive.  She was followed in the emergency department until 5/14.  She was transferred to our facility.  She apparently was noncompliant with her medicines while in the emergency room and only received them intermittently.  Her current psychiatric medications at this time include as needed's of Cogentin, Haldol.  Her Haldol dosage is standing at 5 mg p.o. or IM twice daily.  Today on examination she remains delusional.  She stated that she has gone to medical school, done a residency in psychiatry, and doesn't understand why we ask her these questions with her knowledge base.  The last psychiatric consultation note that I can find in the chart is from 01/25/2020.  It looks like she had received Seroquel.   Patient is seen by this provider today and she denies having ever been diagnosed with schizophrenia.  She stated "the people to give me those diagnoses are the crazy ones."  Her speech is tangential and ruminates.  She seems to speak in circles with never given direct answers.  She denies needing any medications.  She currently denies any suicidal homicidal ideations and denies any hallucinations.  Patient's daughter was contacted, Florentina Jenny, to request if she knew of any medications that  her mother was on.  She states that her mother's medications are filled at [facility] and she is unaware of what their current medications are the reports that she had notified other staff in the past that Haldol seemed to work for her for stabilization.  Patient's history of mental health poor due to the patient being a poor historian as well as the daughter having lack of information.  It does appear that the patient has a long history of mental health issues as well as multiple hospitalizations in the past.  Patient was asked about why she has not been sleeping well and the patient states "I am always at rest so I do not need to sleep."  Associated Signs/Symptoms: Depression Symptoms:  insomnia, (Hypo) Manic Symptoms:  Delusions, Elevated Mood, Flight of Ideas, Grandiosity, Hallucinations, Impulsivity, Labiality of Mood, Anxiety Symptoms:  Denies Psychotic Symptoms:  Delusions, Hallucinations: Auditory Paranoia, PTSD Symptoms: Negative Total Time spent with patient: 1 hour  Past Psychiatric History: Mental health history is limited due to patient's mental health status as well as a unknown mental health history information from family.  This appears the patient has had multiple hospitalizations over the years with a long history of mental health issues  Is the patient at risk to self? Yes.    Has the patient been a risk to self in the past 6 months? No.  Has the patient been a risk to self within the distant past? Yes.    Is the patient a risk to others? No.  Has the patient been a risk to others in the past 6 months? No.  Has the patient been a risk to others  within the distant past? No.   Prior Inpatient Therapy:   Prior Outpatient Therapy:    Alcohol Screening: 1. How often do you have a drink containing alcohol?: Never 2. How many drinks containing alcohol do you have on a typical day when you are drinking?: 1 or 2 3. How often do you have six or more drinks on one occasion?:  Never AUDIT-C Score: 0 4. How often during the last year have you found that you were not able to stop drinking once you had started?: Never 5. How often during the last year have you failed to do what was normally expected from you because of drinking?: Never 6. How often during the last year have you needed a first drink in the morning to get yourself going after a heavy drinking session?: Never 7. How often during the last year have you had a feeling of guilt of remorse after drinking?: Never 8. How often during the last year have you been unable to remember what happened the night before because you had been drinking?: Never 9. Have you or someone else been injured as a result of your drinking?: No 10. Has a relative or friend or a doctor or another health worker been concerned about your drinking or suggested you cut down?: No Alcohol Use Disorder Identification Test Final Score (AUDIT): 0 Substance Abuse History in the last 12 months:  No. Consequences of Substance Abuse: NA Previous Psychotropic Medications: Yes  Psychological Evaluations: Yes  Past Medical History:  Past Medical History:  Diagnosis Date  . Hypertension   . Schizoaffective disorder [facility])     Past Surgical History:  Procedure Laterality Date  . CHOLECYSTECTOMY  2006   Family History:  Family History  Problem Relation Age of Onset  . Coronary artery disease Father        died 44 yo  . Diabetes Mother        died 51  . Hypertension Brother    Family Psychiatric  History: Unknown Tobacco Screening:   Social History:  Social History   Substance and Sexual Activity  Alcohol Use No     Social History   Substance and Sexual Activity  Drug Use No    Additional Social History:                           Allergies:  No Known Allergies Lab Results:  Results for orders placed or performed during the hospital encounter of 01/26/20 (from the past 48 hour(s))  Hemoglobin A1c     Status: Abnormal    Collection Time: 01/27/20  6:30 AM  Result Value Ref Range   Hgb A1c MFr Bld 6.4 (H) 4.8 - 5.6 %    Comment: (NOTE) Pre diabetes:          5.7%-6.4% Diabetes:              >6.4% Glycemic control for   <7.0% adults with diabetes    Mean Plasma Glucose 136.98 mg/dL    Comment: Performed at [facility] [address] [place], [place] [address]  Lipid panel     Status: Abnormal   Collection Time: 01/27/20  6:30 AM  Result Value Ref Range   Cholesterol 163 0 - 200 mg/dL   Triglycerides 46 <150 mg/dL   HDL 47 >40 mg/dL   Total CHOL/HDL Ratio 3.5 RATIO   VLDL 9 0 - 40 mg/dL   LDL Cholesterol 107 (H)  0 - 99 mg/dL    Comment:        Total Cholesterol/HDL:CHD Risk Coronary Heart Disease Risk Table                     Men   Women  1/2 Average Risk   3.4   3.3  Average Risk       5.0   4.4  2 X Average Risk   9.6   7.1  3 X Average Risk  23.4   11.0        Use the calculated Patient Ratio above and the CHD Risk Table to determine the patient's CHD Risk.        ATP III CLASSIFICATION (LDL):  <100     mg/dL   Optimal  643-329  mg/dL   Near or Above                    Optimal  130-159  mg/dL   Borderline  518-841  mg/dL   High  >660     mg/dL   Very High Performed at [facility], [address] [place], [place] [address]   TSH     Status: None   Collection Time: 01/27/20  6:30 AM  Result Value Ref Range   TSH 1.282 0.350 - 4.500 uIU/mL    Comment: Performed by a 3rd Generation assay with a functional sensitivity of <=0.01 uIU/mL. Performed at [facility], [address], [place], [place] [address]     Blood Alcohol level:  Lab Results  Component Value Date   ETH <5 02/25/2015    Metabolic Disorder Labs:  Lab Results  Component Value Date   HGBA1C 6.4 (H) 01/27/2020   MPG 136.98 01/27/2020   No results found for: PROLACTIN Lab Results  Component Value Date   CHOL 163 01/27/2020   TRIG 46 01/27/2020   HDL 47  01/27/2020   CHOLHDL 3.5 01/27/2020   VLDL 9 01/27/2020   LDLCALC 107 (H) 01/27/2020   LDLCALC 138 (H) 11/17/2007    Current Medications: Current Facility-Administered Medications  Medication Dose Route Frequency Provider Last Rate Last Admin  . acetaminophen (TYLENOL) tablet 650 mg  650 mg Oral Q4H PRN Rankin, Shuvon B, NP      . alum & mag hydroxide-simeth (MAALOX/MYLANTA) 200-200-20 MG/5ML suspension 30 mL  30 mL Oral Q6H PRN Rankin, Shuvon B, NP      . [START ON 01/28/2020] amLODipine (NORVASC) tablet 10 mg  10 mg Oral Daily Antonieta Pert, MD      . benztropine (COGENTIN) tablet 0.5 mg  0.5 mg Oral BID Merilynn Haydu, Feliz Beam B, FNP   0.5 mg at 01/27/20 2355   Or  . benztropine mesylate (COGENTIN) injection 0.5 mg  0.5 mg Intramuscular BID Panzy Bubeck, Feliz Beam B, FNP      . diphenhydrAMINE (BENADRYL) capsule 50 mg  50 mg Oral Q6H PRN Aadya Kindler, Gerlene Burdock, FNP   50 mg at 01/26/20 1715   Or  . diphenhydrAMINE (BENADRYL) injection 50 mg  50 mg Intramuscular Q6H PRN Blake Vetrano, Feliz Beam B, FNP      . haloperidol (HALDOL) tablet 10 mg  10 mg Oral BID Antonieta Pert, MD       Or  . haloperidol lactate (HALDOL) injection 10 mg  10 mg Intramuscular BID Antonieta Pert, MD      . haloperidol (HALDOL) tablet 5 mg  5 mg Oral Q6H PRN Lavance Beazer, Gerlene Burdock,  FNP       Or  . haloperidol lactate (HALDOL) injection 10 mg  10 mg Intramuscular Q6H PRN Calianne Larue, Feliz Beamravis B, FNP      . hydrochlorothiazide (MICROZIDE) capsule 12.5 mg  12.5 mg Oral Daily Alba Kriesel B, FNP   12.5 mg at 01/27/20 0817  . LORazepam (ATIVAN) tablet 2 mg  2 mg Oral Q6H PRN Brevon Dewald, Gerlene Burdockravis B, FNP       Or  . LORazepam (ATIVAN) injection 2 mg  2 mg Intramuscular Q6H PRN Lizzie Cokley, Gerlene Burdockravis B, FNP      . traZODone (DESYREL) tablet 50 mg  50 mg Oral QHS PRN Rankin, Shuvon B, NP       PTA Medications: Medications Prior to Admission  Medication Sig Dispense Refill Last Dose  . amLODipine (NORVASC) 5 MG tablet Take 1 tablet (5 mg total) by mouth daily. 30 tablet 0      Musculoskeletal: Strength & Muscle Tone: within normal limits Gait & Station: normal Patient leans: N/A  Psychiatric Specialty Exam: Physical Exam  Nursing note and vitals reviewed. Constitutional: She appears well-developed and well-nourished.  Respiratory: Effort normal.  Musculoskeletal:        General: Normal range of motion.  Neurological: She is alert.  Skin: Skin is warm.  Psychiatric: Her speech is tangential. She is withdrawn and actively hallucinating. Thought content is paranoid and delusional. Cognition and memory are impaired. She expresses impulsivity and inappropriate judgment.    Review of Systems  Constitutional: Negative.   HENT: Negative.   Eyes: Negative.   Respiratory: Negative.   Cardiovascular: Negative.   Gastrointestinal: Negative.   Genitourinary: Negative.   Musculoskeletal: Negative.   Skin: Negative.   Neurological: Negative.   Psychiatric/Behavioral: Negative.     Blood pressure (!) 158/108, pulse (!) 104, temperature 97.7 F (36.5 C), temperature source Oral.There is no height or weight on file to calculate BMI.  General Appearance: Disheveled  Eye Contact:  Fair  Speech:  Clear and Coherent and Normal Rate  Volume:  Normal  Mood:  Euthymic  Affect:  Congruent  Thought Process:  Disorganized and Descriptions of Associations: Tangential  Orientation:  Negative  Thought Content:  Illogical, Delusions, Hallucinations: Auditory, Paranoid Ideation, Rumination and Tangential  Suicidal Thoughts:  No  Homicidal Thoughts:  No  Memory:  Immediate;   Poor Recent;   Poor Remote;   Poor  Judgement:  Impaired  Insight:  Lacking  Psychomotor Activity:  Normal  Concentration:  Concentration: Poor  Recall:  Poor  Fund of Knowledge:  Poor  Language:  Fair  Akathisia:  No  Handed:  Right  AIMS (if indicated):     Assets:  LobbyistCommunication Skills Financial Resources/Insurance Housing Resilience Social Support Transportation  ADL's:  Impaired   Cognition:  Impaired,  Mild  Sleep:       Treatment Plan Summary: Daily contact with patient to assess and evaluate symptoms and progress in treatment and Medication management  [facility] Waiting on daughter for information about potential guardian  Observation Level/Precautions:  15 minute checks  Laboratory:  Reviewed  Psychotherapy:  Group therapy  [facility]  [facility]  Consultations:  As needed  Discharge Concerns:  Compliance  Estimated LOS: 5-7 Days  Other:  Admit to [address]   Physician Treatment Plan for Primary Diagnosis: Schizoaffective disorder (HCC) Long Term Goal(s): Improvement in symptoms so as ready for discharge  Short Term Goals: Ability to identify changes in lifestyle to reduce recurrence of condition will  improve, Ability to verbalize feelings will improve, Ability to disclose and discuss suicidal ideas, Ability to demonstrate self-control will improve, Ability to identify and develop effective coping behaviors will improve, Ability to maintain clinical measurements within normal limits will improve and Compliance with prescribed medications will improve  Physician Treatment Plan for Secondary Diagnosis: Principal Problem:   Schizoaffective disorder (HCC)  Long Term Goal(s): Improvement in symptoms so as ready for discharge  Short Term Goals: Ability to identify changes in lifestyle to reduce recurrence of condition will improve, Ability to verbalize feelings will improve, Ability to disclose and discuss suicidal ideas, Ability to demonstrate self-control will improve, Ability to identify and develop effective coping behaviors will improve, Ability to maintain clinical measurements within normal limits will improve and Compliance with prescribed medications will improve  I certify that inpatient services furnished can reasonably be expected to improve the patient's condition.    Maryfrances Bunnell, FNP 5/15/20212:30 PM

## 2020-01-27 NOTE — BHH Suicide Risk Assessment (Signed)
[facility] Admission Suicide Risk Assessment   Nursing information obtained from:  Patient, Review of record Demographic factors:  Unemployed, Low socioeconomic status Current Mental Status:  NA Loss Factors:  Decline in physical health Historical Factors:  Impulsivity Risk Reduction Factors:  Positive social support, Religious beliefs about death, Living with another person, especially a relative, Positive therapeutic relationship  Total Time spent with patient: 20 minutes Principal Problem: <principal problem not specified> Diagnosis:  Active Problems:   Schizoaffective disorder (HCC)  Subjective Data: Patient is seen and examined.  Patient is a 59 year old female with a reported past psychiatric history significant for schizoaffective disorder who originally presented to the [facility] emergency department on 01/20/2020.  She was noted at that time to be pressured, hyper religious and tangential.  She was started on Zyprexa.  Unfortunately patient was COVID-19 positive.  She was followed in the emergency department until 5/14.  She was transferred to our facility.  She apparently was noncompliant with her medicines while in the emergency room and only received them intermittently.  Her current psychiatric medications at this time include as needed's of Cogentin, Haldol.  Her Haldol dosage is standing at 5 mg p.o. or IM twice daily.  Today on examination she remains delusional.  She stated that she has gone to medical school, done a residency in psychiatry, and doesn't understand why we ask her these questions with her knowledge base.  The last psychiatric consultation note that I can find in the chart is from 01/25/2020.  It looks like she had received Seroquel.  Continued Clinical Symptoms:  Alcohol Use Disorder Identification Test Final Score (AUDIT): 0 The "Alcohol Use Disorders Identification Test", Guidelines for Use in Primary Care, Second Edition.  World Science writer  [facility]). Score between 0-7:  no or low risk or alcohol related problems. Score between 8-15:  moderate risk of alcohol related problems. Score between 16-19:  high risk of alcohol related problems. Score 20 or above:  warrants further diagnostic evaluation for alcohol dependence and treatment.   CLINICAL FACTORS:   Bipolar Disorder:   Mixed State Schizophrenia:   Paranoid or undifferentiated type   Musculoskeletal: Strength & Muscle Tone: within normal limits Gait & Station: normal Patient leans: N/A  Psychiatric Specialty Exam: Physical Exam  Nursing note and vitals reviewed. Constitutional: She is oriented to person, place, and time. She appears well-developed and well-nourished.  HENT:  Head: Normocephalic and atraumatic.  Respiratory: Effort normal.  Neurological: She is alert and oriented to person, place, and time.    Review of Systems  Blood pressure (!) 158/108, pulse (!) 104, temperature 97.7 F (36.5 C), temperature source Oral.There is no height or weight on file to calculate BMI.  General Appearance: Casual  Eye Contact:  Fair  Speech:  Normal Rate  Volume:  Normal  Mood:  Anxious, Dysphoric and Irritable  Affect:  Congruent  Thought Process:  Coherent and Descriptions of Associations: Circumstantial  Orientation:  Negative  Thought Content:  Delusions  Suicidal Thoughts:  No  Homicidal Thoughts:  No  Memory:  Immediate;   Poor Recent;   Poor Remote;   Poor  Judgement:  Impaired  Insight:  Lacking  Psychomotor Activity:  Normal  Concentration:  Concentration: Fair and Attention Span: Fair  Recall:  [employer] of Knowledge:  Fair  Language:  Good  Akathisia:  Negative  Handed:  Right  AIMS (if indicated):     Assets:  Desire for Improvement Resilience  ADL's:  Intact  Cognition:  Impaired,  Mild  Sleep:         COGNITIVE FEATURES THAT CONTRIBUTE TO RISK:  None    SUICIDE RISK:   Mild:  Suicidal ideation of limited frequency, intensity,  duration, and specificity.  There are no identifiable plans, no associated intent, mild dysphoria and related symptoms, good self-control (both objective and subjective assessment), few other risk factors, and identifiable protective factors, including available and accessible social support.  PLAN OF CARE: Patient is seen and examined.  Patient is a 59 year old female with the above-stated past psychiatric history who is admitted for exacerbation of schizoaffective disorder.  She will be admitted to the hospital.  She will be encouraged to attend groups.  She will be encouraged to work on her coping skills.  Review of the original notes in the [facility] emergency department showed that her husband is a attempting to gain guardianship over the patient.  The patient would not answer questions with regard to orientation today, so it is unclear what her cognitive status is.  Also it is unclear as to what medication she was taking prior to admission.  She remains psychotic, so we will increase her haloperidol to 10 mg p.o. or IM twice daily.  We will contact the family today to get additional information to find out what medication she may have been on the past that controlled things.  She was diagnosed with COVID-19, but is been 10 days and so her infective status should be stable.  Review of her laboratories showed an elevated blood sugar at 131.  Her white blood cell count was slightly low at 3.7.  Her MCH and MCHC are both low as well.  Her hemoglobin A1c was elevated at 6.4, and clearly she is diabetic.  We will attempt to get blood sugars on her.  Her EKG showed the evidence of a previous infarct, but was sinus rhythm and a QTC that was normal.  Her blood pressure has been elevated since she has been on the unit.  The 2 readings have been 146/93 and 158/108.  She was mildly tachycardic with a rate of 104 this morning.  Sleep was not recorded.  I will increase her amlodipine to 10 mg p.o.  daily.  No change in the hydrochlorothiazide at this point. I certify that inpatient services furnished can reasonably be expected to improve the patient's condition.   Sharma Covert, MD 01/27/2020, 12:54 PM

## 2020-01-28 MED ORDER — LORAZEPAM 1 MG PO TABS: 1.0000 mg | ORAL_TABLET | ORAL | Status: DC | PRN

## 2020-01-28 MED ORDER — ZIPRASIDONE MESYLATE 20 MG IM SOLR: 20.0000 mg | INTRAMUSCULAR | Status: DC | PRN

## 2020-01-28 MED ORDER — CARBAMAZEPINE 100 MG PO CHEW
100.0000 mg | CHEWABLE_TABLET | Freq: Two times a day (BID) | ORAL | Status: DC
  Administered 2020-01-29 – 2020-01-30 (×2): 100 mg via ORAL
  Filled 2020-01-28 (×9): qty 1

## 2020-01-28 MED ORDER — RISPERIDONE 2 MG PO TBDP
2.0000 mg | ORAL_TABLET | Freq: Every day | ORAL | Status: DC
  Filled 2020-01-28 (×4): qty 1

## 2020-01-28 MED ORDER — RISPERIDONE 1 MG PO TBDP
1.0000 mg | ORAL_TABLET | Freq: Every day | ORAL | Status: DC
  Administered 2020-01-29 – 2020-01-30 (×2): 1 mg via ORAL
  Filled 2020-01-28 (×5): qty 1

## 2020-01-28 MED ORDER — RISPERIDONE 2 MG PO TBDP: 2.0000 mg | ORAL_TABLET | Freq: Three times a day (TID) | ORAL | Status: DC | PRN

## 2020-01-28 NOTE — Progress Notes (Signed)
Patient has been asleep since shift change. No distress noted with safety maintained with 15 min checks/

## 2020-01-28 NOTE — BHH Counselor (Signed)
Clinical Social Work Note  CSW met with patient to attempt to do Psychosocial Assessment with her.  She responded to the first question for about 5 months with tangential, rambling answer that was not responsive to the actual question.  CSW aborted the attempt at that point, informed her that the nurse was asking her to come to the medication window for medicines.  She became irritable and said she does not need medicine, knows she has the right to refuse them, and already took medicine earlier in the morning.  This was shared with nurse.  Selmer Dominion, LCSW 01/28/2020, 4:19 PM

## 2020-01-28 NOTE — Progress Notes (Signed)
   01/28/20 0900  Psych Admission Type (Psych Patients Only)  Admission Status Involuntary  Psychosocial Assessment  Patient Complaints None  Eye Contact Other (Comment)  Facial Expression Other (Comment)  Affect Preoccupied  Speech Logical/coherent (pt asleep)  Interaction Other (Comment)  Motor Activity Other (Comment)  Appearance/Hygiene Unremarkable  Behavior Characteristics Cooperative  Mood Preoccupied  Thought Process  Coherency Disorganized (pt asleep no distress noted)  Content Religiosity  Delusions Religious  Perception Derealization  Hallucination None reported or observed  Judgment Poor  Confusion None (pt asleep)  Danger to Self  Current suicidal ideation? Denies  Danger to Others  Danger to Others None reported or observed  Note: Patient presents with irritable affect and mood.  Denies suicidal thoughts, auditory and visual hallucinations.  Patient refused all afternoon medication and EKG.  Patient is withdrawn and isolates to her room.  Observed responding to internal stimuli and is hyper religious.  Routine safety checks maintained every 15 minutes.  Patient is safe on the unit.

## 2020-01-28 NOTE — Progress Notes (Signed)
[facility] MD Progress Note  01/28/2020 11:36 AM Sally Wang  MRN:  [id number] Subjective: Patient is a 59 year old female with a past psychiatric history significant for schizoaffective disorder who originally presented to the [facility] emergency department on 01/20/2020.  At that time she was noted to be pressured, hyper religious and tangential.  Unfortunately the patient was diagnosed with COVID-19, and remained in the emergency department until 5/14.  She was transferred to our facility on that date.  Objective: Patient is seen and examined.  Patient is a 59 year old female with the above-stated past psychiatric history is seen in follow-up.  She remains lying in bed.  During the interview today she kept her eyes closed.  She remains pleasant but tangential.  She remains hyper religious.  She received haloperidol 10 mg twice daily either p.o. or IM yesterday.  We have limited information on her.  During the emergency room stay she received Haldol, lorazepam, Geodon, Zyprexa without relief of her symptomatology.  Review of her laboratories which primarily go back to her first evaluation while in the emergency room from 5/4 showed essentially normal electrolytes except for a mildly elevated glucose, normal lipid panel, a decreased white blood cell count at 3.7.  Her MCH and MCHC were both slightly decreased.  Platelets were normal.  Differential was normal.  Hemoglobin A1c is 6.4 (patient has refused to allow [place] to Accu-Chek her).  TSH was normal at 1.282.  Her blood pressure slightly elevated today.  Earlier blood pressure is 138/97, repeat was 149/87.  Pulse was 92/94.  She is afebrile.  No pulse ox was noted.  She slept 1 hour last night.  Principal Problem: Schizoaffective disorder ([place]) Diagnosis: Principal Problem:   Schizoaffective disorder ([place])  Total Time spent with patient: 20 minutes  Past Psychiatric History: See admission H&P  Past Medical History:  Past Medical History:   Diagnosis Date  . Hypertension   . Schizoaffective disorder [facility])     Past Surgical History:  Procedure Laterality Date  . CHOLECYSTECTOMY  2006   Family History:  Family History  Problem Relation Age of Onset  . Coronary artery disease Father        died 52 yo  . Diabetes Mother        died 75  . Hypertension Brother    Family Psychiatric  History: See admission H&P Social History:  Social History   Substance and Sexual Activity  Alcohol Use No     Social History   Substance and Sexual Activity  Drug Use No    Social History   Socioeconomic History  . Marital status: Married    Spouse name: Hoy Morn  . Number of children: Not on file  . Years of education: Not on file  . Highest education level: Not on file  Occupational History  . Occupation: homemaker  Tobacco Use  . Smoking status: Never Smoker  . Smokeless tobacco: Never Used  Substance and Sexual Activity  . Alcohol use: No  . Drug use: No  . Sexual activity: Not Currently  Other Topics Concern  . Not on file  Social History Narrative   Husband - [place] -2010 granddaughte      Pets -  Pit bulls #4            Social Determinants of Health   Financial Resource Strain:   . Difficulty of Paying Living Expenses:   Food Insecurity:   .  Worried About Programme researcher, broadcasting/film/video in the Last Year:   . Barista in the Last Year:   Transportation Needs:   . Freight forwarder (Medical):   Marland Kitchen Lack of Transportation (Non-Medical):   Physical Activity:   . Days of Exercise per Week:   . Minutes of Exercise per Session:   Stress:   . Feeling of Stress :   Social Connections:   . Frequency of Communication with Friends and Family:   . Frequency of Social Gatherings with Friends and Family:   . Attends Religious Services:   . Active Member of Clubs or Organizations:   . Attends Banker Meetings:   Marland Kitchen Marital Status:    Additional Social  History:                         Sleep: Poor  Appetite:  Fair  Current Medications: Current Facility-Administered Medications  Medication Dose Route Frequency Provider Last Rate Last Admin  . acetaminophen (TYLENOL) tablet 650 mg  650 mg Oral Q4H PRN Rankin, Shuvon B, NP      . alum & mag hydroxide-simeth (MAALOX/MYLANTA) 200-200-20 MG/5ML suspension 30 mL  30 mL Oral Q6H PRN Rankin, Shuvon B, NP      . amLODipine (NORVASC) tablet 10 mg  10 mg Oral Daily Antonieta Pert, MD   10 mg at 01/28/20 0755  . benztropine (COGENTIN) tablet 0.5 mg  0.5 mg Oral BID Money, Gerlene Burdock, FNP   0.5 mg at 01/28/20 8938   Or  . benztropine mesylate (COGENTIN) injection 0.5 mg  0.5 mg Intramuscular BID Money, Feliz Beam B, FNP      . diphenhydrAMINE (BENADRYL) capsule 50 mg  50 mg Oral Q6H PRN Money, Gerlene Burdock, FNP   50 mg at 01/26/20 1715   Or  . diphenhydrAMINE (BENADRYL) injection 50 mg  50 mg Intramuscular Q6H PRN Money, Feliz Beam B, FNP      . haloperidol (HALDOL) tablet 10 mg  10 mg Oral BID Antonieta Pert, MD   10 mg at 01/28/20 1017   Or  . haloperidol lactate (HALDOL) injection 10 mg  10 mg Intramuscular BID Antonieta Pert, MD      . haloperidol (HALDOL) tablet 5 mg  5 mg Oral Q6H PRN Money, Gerlene Burdock, FNP       Or  . haloperidol lactate (HALDOL) injection 10 mg  10 mg Intramuscular Q6H PRN Money, Gerlene Burdock, FNP      . hydrochlorothiazide (MICROZIDE) capsule 12.5 mg  12.5 mg Oral Daily Money, Travis B, FNP   12.5 mg at 01/28/20 0755  . LORazepam (ATIVAN) tablet 2 mg  2 mg Oral Q6H PRN Money, Gerlene Burdock, FNP       Or  . LORazepam (ATIVAN) injection 2 mg  2 mg Intramuscular Q6H PRN Money, Gerlene Burdock, FNP      . traZODone (DESYREL) tablet 50 mg  50 mg Oral QHS PRN Rankin, Shuvon B, NP        Lab Results:  Results for orders placed or performed during the hospital encounter of 01/26/20 (from the past 48 hour(s))  Hemoglobin A1c     Status: Abnormal   Collection Time: 01/27/20  6:30 AM   Result Value Ref Range   Hgb A1c MFr Bld 6.4 (H) 4.8 - 5.6 %    Comment: (NOTE) Pre diabetes:          5.7%-6.4% Diabetes:              >  6.4% Glycemic control for   <7.0% adults with diabetes    Mean Plasma Glucose 136.98 mg/dL    Comment: Performed at [facility], [address] [place], [place] [address]  Lipid panel     Status: Abnormal   Collection Time: 01/27/20  6:30 AM  Result Value Ref Range   Cholesterol 163 0 - 200 mg/dL   Triglycerides 46 <604<150 mg/dL   HDL 47 >54>40 mg/dL   Total CHOL/HDL Ratio 3.5 RATIO   VLDL 9 0 - 40 mg/dL   LDL Cholesterol 098107 (H) 0 - 99 mg/dL    Comment:        Total Cholesterol/HDL:CHD Risk Coronary Heart Disease Risk Table                     Men   Women  1/2 Average Risk   3.4   3.3  Average Risk       5.0   4.4  2 X Average Risk   9.6   7.1  3 X Average Risk  23.4   11.0        Use the calculated Patient Ratio above and the CHD Risk Table to determine the patient's CHD Risk.        ATP III CLASSIFICATION (LDL):  <100     mg/dL   Optimal  [id number]  mg/dL   Near or Above                    Optimal  130-159  mg/dL   Borderline  [id number]  mg/dL   High  >130>190     mg/dL   Very High Performed at [facility], [address] [place], [place] [address]   TSH     Status: None   Collection Time: 01/27/20  6:30 AM  Result Value Ref Range   TSH 1.282 0.350 - 4.500 uIU/mL    Comment: Performed by a 3rd Generation assay with a functional sensitivity of <=0.01 uIU/mL. Performed at [facility], [address] [place], [place] [address]     Blood Alcohol level:  Lab Results  Component Value Date   ETH <5 02/25/2015    Metabolic Disorder Labs: Lab Results  Component Value Date   HGBA1C 6.4 (H) 01/27/2020   MPG 136.98 01/27/2020   No results found for: PROLACTIN Lab Results  Component Value Date   CHOL 163 01/27/2020   TRIG 46 01/27/2020   HDL 47 01/27/2020   CHOLHDL 3.5 01/27/2020   VLDL  9 01/27/2020   LDLCALC 107 (H) 01/27/2020   LDLCALC 138 (H) 11/17/2007    Physical Findings: AIMS: Facial and Oral Movements Muscles of Facial Expression: None, normal Lips and Perioral Area: None, normal Jaw: None, normal Tongue: None, normal,Extremity Movements Upper (arms, wrists, hands, fingers): None, normal Lower (legs, knees, ankles, toes): None, normal, Trunk Movements Neck, shoulders, hips: None, normal, Overall Severity Severity of abnormal movements (highest score from questions above): None, normal Incapacitation due to abnormal movements: None, normal Patient's awareness of abnormal movements (rate only patient's report): No Awareness, Dental Status Current problems with teeth and/or dentures?: No Does patient usually wear dentures?: No  CIWA:    COWS:     Musculoskeletal: Strength & Muscle Tone: decreased Gait & Station: I have not been able to see the patient ambulate since she was brought to our facility, but previous examinations in the emergency room showed that she was able to get  up and go to the bathroom without difficulty. Patient leans: N/A  Psychiatric Specialty Exam: Physical Exam  Nursing note and vitals reviewed. Constitutional: She appears well-developed and well-nourished.  HENT:  Head: Normocephalic and atraumatic.  Respiratory: Effort normal.  Neurological: She is alert.    Review of Systems  Blood pressure (!) 149/87, pulse 94, temperature 97.7 F (36.5 C), temperature source Oral, resp. rate 20.There is no height or weight on file to calculate BMI.  General Appearance: Casual  Eye Contact:  None  Speech:  Pressured  Volume:  Normal  Mood:  Euthymic  Affect:  Non-Congruent  Thought Process:  Goal Directed and Descriptions of Associations: Tangential  Orientation:  Negative  Thought Content:  Delusions  Suicidal Thoughts:  No  Homicidal Thoughts:  No  Memory:  Immediate;   Poor Recent;   Poor Remote;   Poor  Judgement:  Impaired   Insight:  Lacking  Psychomotor Activity:  Decreased  Concentration:  Concentration: Fair and Attention Span: Fair  Recall:  [employer] of Knowledge:  Poor  Language:  Fair  Akathisia:  Negative  Handed:  Right  AIMS (if indicated):     Assets:  Desire for Improvement Resilience  ADL's:  Impaired  Cognition:  WNL  Sleep:  Number of Hours: 1     Treatment Plan Summary: Daily contact with patient to assess and evaluate symptoms and progress in treatment, Medication management and Plan : Patient is seen and examined.  Patient is a 59 year old female with the above-stated past psychiatric history who is seen in follow-up.  Diagnosis: #1 schizoaffective disorder bipolar type versus bipolar disorder, currently manic with psychotic features, #2 hypertension, #3 recent COVID-19 positive test, #4 diabetes mellitus type 2  She has been on several different antipsychotics so far, and she remains psychotic.  She received Haldol 10 mg twice daily yesterday.  Unfortunately she is not sleeping.  Review of the chart showed that her daughters have seen her while she is at [employer], and several people have spoken with her but no one seems to have gotten any information with regard to medication she may have been on previously.  I have asked the pharmacy here to try and look in their database to see if they can find anything by which she may have been treated successfully with in the past.  We will try and get a release of information to be able to talk to her family.  Initially leave she was paranoid her husband was using cocaine and doing other things, and it is not clear whether or not she is thinking delusionally or whether it is true.  She is tangential today.  Her renal function is normal, but she is on diuretics.  We have a repeat metabolic panel ordered.  Given her age I do not think lithium would be the greatest choice.  Her liver function enzymes are normal, her MCH and MCHC are both low, but she is  not anemic.  We will try low-dose Tegretol on her.  We will start with 100 mg p.o. twice daily.  Given the lack of efficacy of her previous medications we will try going to Risperdal and see if that is more effective in terms of her mood stability and sleep.  We will start 2 mg p.o. nightly and 1 mg p.o. daily.  If she becomes sedated with this but improved from a mental status standpoint we may stop the Tegretol.  We will just have to see how  this goes.  We will also need an EKG followed up to make sure about her QTc interval given her age.  1.  Continue amlodipine 10 mg p.o. daily for hypertension. 2.  Hold Haldol. 3.  Stop Cogentin. 4.  Continue hydrochlorothiazide 12.5 mg p.o. daily for hypertension. 5.  Continue lorazepam as needed for agitation and anxiety. 6. start Risperdal 1 mg p.o. daily and 2 mg p.o. nightly for psychosis. 7.  Start Tegretol 100 mg p.o. twice daily for mood stability and titrate. 8.  Continue trazodone 50 mg p.o. nightly as needed insomnia. 9.  I have ordered repeat metabolic panel given the hydrochlorothiazide and concern for hypokalemia and dehydration. 10.  I have asked the pharmacy to attempt to find out any information possible on any old psychiatric medicine she may have taken in the past. 11.  We will attempt to get a release of information signed so I can speak to daughters and/or her husband. 11.  Disposition planning-in progress. Antonieta Pert, MD 01/28/2020, 11:36 AM

## 2020-01-28 NOTE — Progress Notes (Signed)
   01/27/20 2230  COVID-19 Daily Checkoff  Have you had a fever (temp > 37.80C/100F)  in the past 24 hours?  No  Have you had any one of these symptoms in the past 24 hours not related to allergies? Runny nose  COVID-19 EXPOSURE  Have you traveled outside the state in the past 14 days? No  Have you been in contact with someone with a confirmed diagnosis of COVID-19 or PUI in the past 14 days without wearing appropriate PPE? No  Have you been living in the same home as a person with confirmed diagnosis of COVID-19 or a PUI (household contact)? No  Have you been diagnosed with COVID-19? No

## 2020-01-28 NOTE — Progress Notes (Signed)
Patient up in her room from bathroom to just sitting on her bench talking to herself.She is pleasant and calm talking about religion.

## 2020-01-28 NOTE — [facility] Group Notes (Signed)
[facility] Group Notes: (Clinical Social Work)   01/28/2020      Type of Therapy:  Group Therapy   Participation Level:  Did Not Attend - was invited individually by Nurse or MHT and chose not to attend.   Leisa Lenz, LCSWA 01/28/2020  3:31 PM

## 2020-01-28 NOTE — Progress Notes (Signed)
Patient refused her CBG and lab work this am. She reported that she is not diabetic and they should have enough blood on file for her.

## 2020-01-29 NOTE — Progress Notes (Signed)
Recreation Therapy Notes  INPATIENT RECREATION THERAPY ASSESSMENT  Patient Details Name: Sally Wang MRN: [id number] DOB: [birth year] Today's Date: 01/29/2020       Information Obtained From: Patient  Able to Participate in Assessment/Interview: Yes  Patient Presentation: Alert(Hyper religious)  Reason for Admission (Per Patient): Other (Comments)(Pt stated she was walking and talking to herself, someone called the police and the police brought her to [facility].)  Patient Stressors: (None identified)  Coping Skills:   TV, Music, Exercise, Meditate, Deep Breathing, Talk, Prayer, Read  Leisure Interests (2+):  Individual - Other (Comment)(Quietness-Meditate)  Frequency of Recreation/Participation: Other (Comment)(Daily)  Awareness of Community Resources:  Yes  Community Resources:  Other (Comment)(Pt stated "everything")  Current Use: Yes  If no, Barriers?:    Expressed Interest in [employer] Information: No  County of Residence:  [place]  Patient Main Form of Transportation: Car  Patient Strengths:  Encourager  Patient Identified Areas of Improvement:  "Everyday we make improvements"  Patient Goal for Hospitalization:  "rest"  Current SI (including self-harm):  No  Current HI:  No  Current AVH: No  Staff Intervention Plan: Group Attendance, Collaborate with Interdisciplinary Treatment Team  Consent to Intern Participation: N/A    Caroll Rancher, LRT/CTRS  Caroll Rancher A 01/29/2020, 3:25 PM

## 2020-01-29 NOTE — Progress Notes (Signed)
   01/29/20 2020  Psych Admission Type (Psych Patients Only)  Admission Status Involuntary  Psychosocial Assessment  Patient Complaints None  Eye Contact Fair  Facial Expression Flat  Affect Anxious;Preoccupied  Speech Logical/coherent;Pressured;Tangential  Interaction Assertive;Forwards little  Motor Activity Other (Comment) (WNL)  Appearance/Hygiene Unremarkable;In hospital gown  Behavior Characteristics Guarded;Cooperative  Mood Preoccupied  Thought Process  Coherency Disorganized;Flight of ideas;Tangential  Content Preoccupation;Religiosity  Delusions Religious  Perception WDL  Hallucination None reported or observed  Judgment Poor  Confusion Mild  Danger to Self  Current suicidal ideation? Denies  Danger to Others  Danger to Others None reported or observed   Pt seen in her room. Pt refuses all nighttime medications. Pt is tangential and religious preoccupation. Pt answers every question with a religiously focused answer. Pt pleasant.

## 2020-01-29 NOTE — [facility] Counselor (Signed)
Per pt's daughter, Florentina Jenny, pt has a guardian ad litem, Kathlene November [phone]). CSW left HIPAA compliant voicemail with law firm.    Ruthann Cancer MSW, [employer] Clincal Social Worker  [facility]

## 2020-01-29 NOTE — Progress Notes (Signed)
Patient has been up the majority of the early morning hours in her room from the bathroom to her bench in her room talking to herself about religion. She had her vitals taken with her blood pressures sitting and standing both elevated. Writer asked if she would take her blood pressure medicine early and she refused to take it. She also refused her CBG. O2 was 100%, vitals charted in epic.

## 2020-01-29 NOTE — B[facility] Counselor (Signed)
CSW attempted to complete PSA with pt. Pt is unable to participate in meaningful conversation at this time due to being hyper-religious and delusional.    Ruthann Cancer MSW, [employer] Clincal Social Worker  [facility]

## 2020-01-29 NOTE — Progress Notes (Signed)
Patient has been in her room in and out of her bathroom. She had washed her scrub pants by hand and had them on her air conditionig unit to dry. Writer encouraged that she let us wash her scrubs and get them back to there clean by in the morning. She did allow [place] to wash her scrubs. She refused her medications.

## 2020-01-29 NOTE — Tx Team (Signed)
Interdisciplinary Treatment and Diagnostic Plan Update  01/29/2020 Time of Session: 9:10am Sally Wang MRN: [id number]  Principal Diagnosis: Schizoaffective disorder ([place])  Secondary Diagnoses: Principal Problem:   Schizoaffective disorder ([place])   Current Medications:  Current Facility-Administered Medications  Medication Dose Route Frequency Provider Last Rate Last Admin  . acetaminophen (TYLENOL) tablet 650 mg  650 mg Oral Q4H PRN Rankin, Shuvon B, NP      . alum & mag hydroxide-simeth (MAALOX/MYLANTA) 200-200-20 MG/5ML suspension 30 mL  30 mL Oral Q6H PRN Rankin, Shuvon B, NP      . amLODipine (NORVASC) tablet 10 mg  10 mg Oral Daily Sharma Covert, MD   10 mg at 01/29/20 0756  . carbamazepine (TEGRETOL) chewable tablet 100 mg  100 mg Oral BID Sharma Covert, MD   100 mg at 01/29/20 0756  . diphenhydrAMINE (BENADRYL) capsule 50 mg  50 mg Oral Q6H PRN Money, Lowry Ram, FNP   50 mg at 01/26/20 1715   Or  . diphenhydrAMINE (BENADRYL) injection 50 mg  50 mg Intramuscular Q6H PRN Money, Darnelle Maffucci B, FNP      . haloperidol (HALDOL) tablet 5 mg  5 mg Oral Q6H PRN Money, Lowry Ram, FNP       Or  . haloperidol lactate (HALDOL) injection 10 mg  10 mg Intramuscular Q6H PRN Money, Darnelle Maffucci B, FNP      . hydrochlorothiazide (MICROZIDE) capsule 12.5 mg  12.5 mg Oral Daily Money, Travis B, FNP   12.5 mg at 01/29/20 0755  . LORazepam (ATIVAN) tablet 2 mg  2 mg Oral Q6H PRN Money, Lowry Ram, FNP       Or  . LORazepam (ATIVAN) injection 2 mg  2 mg Intramuscular Q6H PRN Money, Lowry Ram, FNP      . risperiDONE (RISPERDAL M-TABS) disintegrating tablet 2 mg  2 mg Oral Q8H PRN Sharma Covert, MD       And  . LORazepam (ATIVAN) tablet 1 mg  1 mg Oral PRN Sharma Covert, MD       And  . ziprasidone (GEODON) injection 20 mg  20 mg Intramuscular PRN Sharma Covert, MD      . risperiDONE (RISPERDAL M-TABS) disintegrating tablet 1 mg  1 mg Oral Daily Sharma Covert, MD   1 mg at 01/29/20 0756   . risperiDONE (RISPERDAL M-TABS) disintegrating tablet 2 mg  2 mg Oral QHS Sharma Covert, MD      . traZODone (DESYREL) tablet 50 mg  50 mg Oral QHS PRN Rankin, Shuvon B, NP       PTA Medications: Medications Prior to Admission  Medication Sig Dispense Refill Last Dose  . amLODipine (NORVASC) 5 MG tablet Take 1 tablet (5 mg total) by mouth daily. 30 tablet 0     Patient Stressors: Health problems Medication change or noncompliance  Patient Strengths: Supportive family/friends  Treatment Modalities: Medication Management, Group therapy, Case management,  1 to 1 session with clinician, Psychoeducation, Recreational therapy.   Physician Treatment Plan for Primary Diagnosis: Schizoaffective disorder ([place]) Long Term Goal(s): Improvement in symptoms so as ready for discharge Improvement in symptoms so as ready for discharge   Short Term Goals: Ability to identify changes in lifestyle to reduce recurrence of condition will improve Ability to verbalize feelings will improve Ability to disclose and discuss suicidal ideas Ability to demonstrate self-control will improve Ability to identify and develop effective coping behaviors will improve Ability to maintain clinical measurements within normal limits  will improve Compliance with prescribed medications will improve Ability to identify changes in lifestyle to reduce recurrence of condition will improve Ability to verbalize feelings will improve Ability to disclose and discuss suicidal ideas Ability to demonstrate self-control will improve Ability to identify and develop effective coping behaviors will improve Ability to maintain clinical measurements within normal limits will improve Compliance with prescribed medications will improve  Medication Management: Evaluate patient's response, side effects, and tolerance of medication regimen.  Therapeutic Interventions: 1 to 1 sessions, Unit Group sessions and Medication  administration.  Evaluation of Outcomes: Not Met  Physician Treatment Plan for Secondary Diagnosis: Principal Problem:   Schizoaffective disorder ([place])  Long Term Goal(s): Improvement in symptoms so as ready for discharge Improvement in symptoms so as ready for discharge   Short Term Goals: Ability to identify changes in lifestyle to reduce recurrence of condition will improve Ability to verbalize feelings will improve Ability to disclose and discuss suicidal ideas Ability to demonstrate self-control will improve Ability to identify and develop effective coping behaviors will improve Ability to maintain clinical measurements within normal limits will improve Compliance with prescribed medications will improve Ability to identify changes in lifestyle to reduce recurrence of condition will improve Ability to verbalize feelings will improve Ability to disclose and discuss suicidal ideas Ability to demonstrate self-control will improve Ability to identify and develop effective coping behaviors will improve Ability to maintain clinical measurements within normal limits will improve Compliance with prescribed medications will improve     Medication Management: Evaluate patient's response, side effects, and tolerance of medication regimen.  Therapeutic Interventions: 1 to 1 sessions, Unit Group sessions and Medication administration.  Evaluation of Outcomes: Not Met   RN Treatment Plan for Primary Diagnosis: Schizoaffective disorder ([place]) Long Term Goal(s): Knowledge of disease and therapeutic regimen to maintain health will improve  Short Term Goals: Ability to participate in decision making will improve, Ability to verbalize feelings will improve, Ability to identify and develop effective coping behaviors will improve and Compliance with prescribed medications will improve  Medication Management: RN will administer medications as ordered by provider, will assess and evaluate patient's  response and provide education to patient for prescribed medication. RN will report any adverse and/or side effects to prescribing provider.  Therapeutic Interventions: 1 on 1 counseling sessions, Psychoeducation, Medication administration, Evaluate responses to treatment, Monitor vital signs and CBGs as ordered, Perform/monitor CIWA, COWS, AIMS and Fall Risk screenings as ordered, Perform wound care treatments as ordered.  Evaluation of Outcomes: Not Met   LCSW Treatment Plan for Primary Diagnosis: Schizoaffective disorder ([place]) Long Term Goal(s): Safe transition to appropriate next level of care at discharge, Engage patient in therapeutic group addressing interpersonal concerns.  Short Term Goals: Engage patient in aftercare planning with referrals and resources, Increase social support, Increase ability to appropriately verbalize feelings, Facilitate acceptance of mental health diagnosis and concerns, Identify triggers associated with mental health/substance abuse issues and Increase skills for wellness and recovery  Therapeutic Interventions: Assess for all discharge needs, 1 to 1 time with Social worker, Explore available resources and support systems, Assess for adequacy in community support network, Educate family and significant other(s) on suicide prevention, Complete Psychosocial Assessment, Interpersonal group therapy.  Evaluation of Outcomes: Not Met   Progress in Treatment: Attending groups: No. Participating in groups: No. Taking medication as prescribed: Yes. Toleration medication: Yes. Family/Significant other contact made: No, will contact:  when provided consents.  Patient understands diagnosis: No. Discussing patient identified problems/goals with staff: No. Medical problems  stabilized or resolved: Yes. Denies suicidal/homicidal ideation: Yes. Issues/concerns per patient self-inventory: No.   New problem(s) identified: No, Describe:  none  New Short Term/Long Term  Goal(s):  Patient Goals:  Patient did not attend.   Discharge Plan or Barriers:   Reason for Continuation of Hospitalization: Delusions  Medication stabilization  Estimated Length of Stay:  3-5 days  Attendees: Patient: Did not attend 01/29/2020   Physician:  01/29/2020   Nursing:  01/29/2020   RN Care Manager: 01/29/2020   Social Worker: Darletta Moll, [place] 01/29/2020   Recreational Therapist:  01/29/2020   Other: Marvia Pickles, [place] 01/29/2020   Other:  01/29/2020   Other: 01/29/2020     Scribe for Treatment Team: Vassie Moselle, LCSW 01/29/2020 9:42 AM

## 2020-01-29 NOTE — Progress Notes (Signed)
   01/29/20 1500  Psych Admission Type (Psych Patients Only)  Admission Status Involuntary  Psychosocial Assessment  Patient Complaints None  Eye Contact Fair  Facial Expression Flat  Affect Anxious;Preoccupied  Speech Logical/coherent;Pressured;Tangential  Interaction Assertive;Defensive;Guarded;Forwards little  Motor Activity Fidgety;Restless  Appearance/Hygiene In scrubs  Behavior Characteristics Guarded  Mood Preoccupied  Thought Process  Coherency Disorganized;Flight of ideas;Tangential  Content Preoccupation;Religiosity  Delusions Religious  Perception WDL  Hallucination None reported or observed  Judgment Poor  Confusion Moderate  Danger to Self  Current suicidal ideation? Denies  Danger to Others  Danger to Others None reported or observed

## 2020-01-29 NOTE — Progress Notes (Signed)
   01/28/20 2130  Psych Admission Type (Psych Patients Only)  Admission Status Involuntary  Psychosocial Assessment  Patient Complaints None  Eye Contact Fair  Facial Expression Flat  Affect Anxious;Preoccupied  Speech Logical/coherent;Pressured;Tangential  Interaction Assertive;Defensive;Guarded;Forwards little  Motor Activity Fidgety;Restless  Appearance/Hygiene In scrubs  Behavior Characteristics Appropriate to situation;Anxious;Fidgety;Guarded;Restless  Mood Anxious;Preoccupied;Pleasant  Thought Process  Coherency Disorganized;Flight of ideas;Tangential  Content Preoccupation;Religiosity  Delusions Religious  Perception WDL  Hallucination None reported or observed  Judgment Poor  Confusion Moderate  Danger to Self  Current suicidal ideation? Denies  Danger to Others  Danger to Others None reported or observed

## 2020-01-29 NOTE — Progress Notes (Signed)
   01/28/20 2130  COVID-19 Daily Checkoff  Have you had a fever (temp > 37.80C/100F)  in the past 24 hours?  No  If you have had runny nose, nasal congestion, sneezing in the past 24 hours, has it worsened? No  COVID-19 EXPOSURE  Have you traveled outside the state in the past 14 days? No  Have you been in contact with someone with a confirmed diagnosis of COVID-19 or PUI in the past 14 days without wearing appropriate PPE? No  Have you been living in the same home as a person with confirmed diagnosis of COVID-19 or a PUI (household contact)? No  Have you been diagnosed with COVID-19? No

## 2020-01-29 NOTE — Progress Notes (Signed)
Recreation Therapy Notes  Date: 5.17.21 Time: 1000 Location: 500 Hall Dayroom  Group Topic: Coping Skills  Goal Area(s) Addresses:  Patients will identify positive coping strategies. Patients will identify benefit of using positive coping strategies over negative coping strategies.  Intervention: Worksheet, pencils  Activity: Healthy vs. Unhealthy Coping Strategies.  Patients were to identify a current problem they are facing, identify the unhealthy coping strategies they have used to address this problem and consequences of those coping strategies.  Patients then identified healthy coping strategies, benefits and barriers to using these strategies.  Education: Pharmacologist, Building control surveyor.   Education Outcome: Acknowledges understanding/In group clarification offered/Needs additional education.   Clinical Observations/Feedback: Pt did not attend group session.     Caroll Rancher, LRT/CTRS         Caroll Rancher A 01/29/2020 11:45 AM

## 2020-01-29 NOTE — Progress Notes (Signed)
[facility] MD Progress Note  01/29/2020 1:18 PM Sally Wang  MRN:  [id number]  Subjective: Follow-up for this 59 year old female diagnosed with schizoaffective disorder.  Patient continues only reporting that she is fine and that she is okay.  She denies having any suicidal homicidal ideations and denies any hallucinations.  Patient states that she is sleeping well and has been eating.  However patient's documented sleep was approximately 1 hour last night.  Patient continues to deny any recent or symptoms for being in the hospital.  She continues speaking the same words over and over such as everything is fine, I am all right, nothing was ever wrong with me.  Principal Problem: Schizoaffective disorder ([place]) Diagnosis: Principal Problem:   Schizoaffective disorder ([place])  Total Time spent with patient: 20 minutes  Past Psychiatric History: See admission H&P  Past Medical History:  Past Medical History:  Diagnosis Date  . Hypertension   . Schizoaffective disorder [facility])     Past Surgical History:  Procedure Laterality Date  . CHOLECYSTECTOMY  2006   Family History:  Family History  Problem Relation Age of Onset  . Coronary artery disease Father        died 21 yo  . Diabetes Mother        died 54  . Hypertension Brother    Family Psychiatric  History: See admission H&P Social History:  Social History   Substance and Sexual Activity  Alcohol Use No     Social History   Substance and Sexual Activity  Drug Use No    Social History   Socioeconomic History  . Marital status: Married    Spouse name: Hoy Morn  . Number of children: Not on file  . Years of education: Not on file  . Highest education level: Not on file  Occupational History  . Occupation: homemaker  Tobacco Use  . Smoking status: Never Smoker  . Smokeless tobacco: Never Used  Substance and Sexual Activity  . Alcohol use: No  . Drug use: No  . Sexual activity: Not Currently  Other Topics Concern  . Not on  file  Social History Narrative   Husband - [place] -2010 [place]      Pets -  Pit bulls #4            Social Determinants of Health   Financial Resource Strain:   . Difficulty of Paying Living Expenses:   Food Insecurity:   . Worried About Charity fundraiser in the Last Year:   . Arboriculturist in the Last Year:   Transportation Needs:   . Film/video editor (Medical):   Marland Kitchen Lack of Transportation (Non-Medical):   Physical Activity:   . Days of Exercise per Week:   . Minutes of Exercise per Session:   Stress:   . Feeling of Stress :   Social Connections:   . Frequency of Communication with Friends and Family:   . Frequency of Social Gatherings with Friends and Family:   . Attends Religious Services:   . Active Member of Clubs or Organizations:   . Attends Archivist Meetings:   Marland Kitchen Marital Status:    Additional Social History:                         Sleep: Poor  Appetite:  Fair  Current Medications: Current Facility-Administered Medications  Medication Dose Route Frequency Provider  Last Rate Last Admin  . acetaminophen (TYLENOL) tablet 650 mg  650 mg Oral Q4H PRN Rankin, Shuvon B, NP      . alum & mag hydroxide-simeth (MAALOX/MYLANTA) 200-200-20 MG/5ML suspension 30 mL  30 mL Oral Q6H PRN Rankin, Shuvon B, NP      . amLODipine (NORVASC) tablet 10 mg  10 mg Oral Daily Antonieta Pert, MD   10 mg at 01/29/20 0756  . carbamazepine (TEGRETOL) chewable tablet 100 mg  100 mg Oral BID Antonieta Pert, MD   100 mg at 01/29/20 0756  . diphenhydrAMINE (BENADRYL) capsule 50 mg  50 mg Oral Q6H PRN Santiana Glidden, Gerlene Burdock, FNP   50 mg at 01/26/20 1715   Or  . diphenhydrAMINE (BENADRYL) injection 50 mg  50 mg Intramuscular Q6H PRN Ashe Gago, Feliz Beam B, FNP      . haloperidol (HALDOL) tablet 5 mg  5 mg Oral Q6H PRN Sammi Stolarz, Gerlene Burdock, FNP       Or  . haloperidol lactate (HALDOL) injection 10 mg  10 mg Intramuscular Q6H  PRN Gibson Lad, Feliz Beam B, FNP      . hydrochlorothiazide (MICROZIDE) capsule 12.5 mg  12.5 mg Oral Daily Chayna Surratt B, FNP   12.5 mg at 01/29/20 0755  . LORazepam (ATIVAN) tablet 2 mg  2 mg Oral Q6H PRN Porcha Deblanc, Gerlene Burdock, FNP       Or  . LORazepam (ATIVAN) injection 2 mg  2 mg Intramuscular Q6H PRN Roshaunda Starkey, Gerlene Burdock, FNP      . risperiDONE (RISPERDAL M-TABS) disintegrating tablet 2 mg  2 mg Oral Q8H PRN Antonieta Pert, MD       And  . LORazepam (ATIVAN) tablet 1 mg  1 mg Oral PRN Antonieta Pert, MD       And  . ziprasidone (GEODON) injection 20 mg  20 mg Intramuscular PRN Antonieta Pert, MD      . risperiDONE (RISPERDAL M-TABS) disintegrating tablet 1 mg  1 mg Oral Daily Antonieta Pert, MD   1 mg at 01/29/20 0756  . risperiDONE (RISPERDAL M-TABS) disintegrating tablet 2 mg  2 mg Oral QHS Jola Babinski Marlane Mingle, MD      . traZODone (DESYREL) tablet 50 mg  50 mg Oral QHS PRN Rankin, Shuvon B, NP        Lab Results:  No results found for this or any previous visit (from the past 48 hour(s)).  Blood Alcohol level:  Lab Results  Component Value Date   ETH <5 02/25/2015    Metabolic Disorder Labs: Lab Results  Component Value Date   HGBA1C 6.4 (H) 01/27/2020   MPG 136.98 01/27/2020   No results found for: PROLACTIN Lab Results  Component Value Date   CHOL 163 01/27/2020   TRIG 46 01/27/2020   HDL 47 01/27/2020   CHOLHDL 3.5 01/27/2020   VLDL 9 01/27/2020   LDLCALC 107 (H) 01/27/2020   LDLCALC 138 (H) 11/17/2007    Physical Findings: AIMS: Facial and Oral Movements Muscles of Facial Expression: None, normal Lips and Perioral Area: None, normal Jaw: None, normal Tongue: None, normal,Extremity Movements Upper (arms, wrists, hands, fingers): None, normal Lower (legs, knees, ankles, toes): None, normal, Trunk Movements Neck, shoulders, hips: None, normal, Overall Severity Severity of abnormal movements (highest score from questions above): None, normal Incapacitation  due to abnormal movements: None, normal Patient's awareness of abnormal movements (rate only patient's report): No Awareness, Dental Status Current problems with teeth and/or dentures?: No Does patient  usually wear dentures?: No  CIWA:    COWS:     Musculoskeletal: Strength & Muscle Tone: decreased Gait & Station: I still have not seen the patient ambulate but the nursing staff report that she has been ambulating in her room without assistance Patient leans: N/A  Psychiatric Specialty Exam: Physical Exam  Nursing note and vitals reviewed. Constitutional: She appears well-developed and well-nourished.  HENT:  Head: Normocephalic and atraumatic.  Respiratory: Effort normal.  Neurological: She is alert.    Review of Systems  Constitutional: Negative.   HENT: Negative.   Eyes: Negative.   Respiratory: Negative.   Cardiovascular: Negative.   Gastrointestinal: Negative.   Genitourinary: Negative.   Musculoskeletal: Negative.   Skin: Negative.   Neurological: Negative.   Psychiatric/Behavioral: Negative.     Blood pressure (!) 154/91, pulse (!) 102, temperature 98 F (36.7 C), temperature source Oral, resp. rate 20, SpO2 100 %.There is no height or weight on file to calculate BMI.  General Appearance: Casual  Eye Contact:  None  Speech:  Pressured  Volume:  Normal  Mood:  Euthymic  Affect:  Non-Congruent  Thought Process:  Goal Directed and Descriptions of Associations: Tangential  Orientation:  Negative  Thought Content:  Delusions and Tangential  Suicidal Thoughts:  No  Homicidal Thoughts:  No  Memory:  Immediate;   Poor Recent;   Poor Remote;   Poor  Judgement:  Impaired  Insight:  Lacking  Psychomotor Activity:  Decreased  Concentration:  Concentration: Fair and Attention Span: Fair  Recall:  [employer] of Knowledge:  Poor  Language:  Fair  Akathisia:  Negative  Handed:  Right  AIMS (if indicated):     Assets:  Desire for Improvement Resilience  ADL's:   Impaired  Cognition:  WNL  Sleep:  Number of Hours: 1   Assessment: Patient presents sitting in her bed but is awake.  She continues to speak tangential and is rambling about how she is fine and there are no issues and there is nothing she needs.  She continues to deny any reason to be in the hospital.  Patient's conversation is illogical and she continues to speak in circles.  She would not answer questions directly other than answering that she is okay.  She has been taking her medications and she has not been aggressive or agitated.  Reviewed labs and lipid profile was within normal limits except for LDL at 107, A1c is 6.4 and TSH is normal  Treatment Plan Summary: Daily contact with patient to assess and evaluate symptoms and progress in treatment and Medication management Continue amlodipine 10 mg p.o. daily for hypertension. Continue hydrochlorothiazide 12.5 mg p.o. daily for hypertension. Continue lorazepam as needed for agitation and anxiety. Continue Risperdal 1 mg p.o. daily and 2 mg p.o. nightly for psychosis. Continue Tegretol 100 mg p.o. twice daily for mood stability and titrate. Continue trazodone 50 mg p.o. nightly as needed insomnia. Encourage medication compliance Encourage group therapy participation Continue Q15 minute safety checks Maryfrances Bunnell, FNP 01/29/2020, 1:18 PM

## 2020-01-30 MED ORDER — HALOPERIDOL LACTATE 5 MG/ML IJ SOLN
10.0000 mg | Freq: Two times a day (BID) | INTRAMUSCULAR | Status: DC
  Administered 2020-01-30 – 2020-02-01 (×2): 10 mg via INTRAMUSCULAR
  Filled 2020-01-30 (×10): qty 2

## 2020-01-30 MED ORDER — RISPERIDONE 2 MG PO TBDP
2.0000 mg | ORAL_TABLET | Freq: Every day | ORAL | Status: DC
  Administered 2020-01-31 – 2020-02-01 (×2): 2 mg via ORAL
  Filled 2020-01-30 (×4): qty 1

## 2020-01-30 MED ORDER — RISPERIDONE 2 MG PO TBDP
4.0000 mg | ORAL_TABLET | Freq: Every day | ORAL | Status: DC
  Administered 2020-01-31: 4 mg via ORAL
  Filled 2020-01-30 (×5): qty 2

## 2020-01-30 MED ORDER — CARBAMAZEPINE 100 MG PO CHEW
300.0000 mg | CHEWABLE_TABLET | Freq: Two times a day (BID) | ORAL | Status: DC
  Administered 2020-01-31 – 2020-02-05 (×9): 300 mg via ORAL
  Filled 2020-01-30 (×16): qty 3

## 2020-01-30 NOTE — Progress Notes (Signed)
Pt was seen in room after she awoke this evening. Pt pleasant but still refusing oral Risperdal. Pt states that "I don't have to take anything that the doctor prescribes if I don't want to. Let him know that I can discharge myself If I want to. Tell him, let he that is without sin cast the first stone." Pt was very tangential and religious. It was explained to pt what provider had ordered and that an injection was ordered if pt still refusing oral meds. Pt allowed this writer to give IM injection of Haldol 10 mg to left deltoid. "I don't blame you, honey. I appreciate all you all have done here. Tell that doctor that I don't even want to see him. What he does will be on him." Will continue to monitor pt.

## 2020-01-30 NOTE — [facility] Suicide Risk Assessment (Signed)
[facility] INPATIENT:  Family/Significant Other Suicide Prevention Education  Suicide Prevention Education: Education Completed; Daughter, Florentina Jenny [phone]), has been identified by the patient as the family member/significant other with whom the patient will be residing, and identified as the person(s) who will aid the patient in the event of a mental health crisis (suicidal ideations/suicide attempt).  With written consent from the patient, the family member/significant other has been provided the following suicide prevention education, prior to the and/or following the discharge of the patient.  The suicide prevention education provided includes the following:  Suicide risk factors  Suicide prevention and interventions  National Suicide Hotline telephone number  [facility] assessment telephone number  [facility] Emergency Assistance 911  [facility] and/or Residential Mobile Crisis Unit telephone number   Request made of family/significant other to:  Remove weapons (e.g., guns, rifles, knives), all items previously/currently identified as safety concern.    Remove drugs/medications (over-the-counter, prescriptions, illicit drugs), all items previously/currently identified as a safety concern.   The family member/significant other verbalizes understanding of the suicide prevention education information provided.  The family member/significant other agrees to remove the items of safety concern listed above.  Florentina Jenny, pt's daughter stated that pt has been receiving medication management from [facility]. She stated that Haldol Dec. Is the medication that has helped her the most, however she stated the medication was changed due to side effects and she is unsure of the new IM she is receiving. Maxine Glenn stated that she believes her mother needs therapy, however she is aware that in her current state she is unable to participate in meaningful conversations. According to  [facility] pt has trauma history and has a lot of self blame and guilt that is unresolved, which she believes has lead to her current mental state. She stated that her family very religious/spiritual and her brother and sister believe that this is all "spiritual warfare" and a "demonic experience."   Maxine Glenn is interested in coming to meet with her mother in person and believes this could help her to open up. Maxine Glenn stated that pt is currently living with her husband, Lavita Pontius and stated that she has no safety concerns for this pt returning home.   Ruthann Cancer MSW, [employer] Clincal Social Worker  [facility]

## 2020-01-30 NOTE — Progress Notes (Signed)
[facility] MD Progress Note  01/30/2020 1:47 PM Sally Wang  MRN:  [id number] Subjective:  Patient is a 59 year old female with a past psychiatric history significant for schizoaffective disorder who originally presented to the [facility] emergency department on 01/20/2020.  At that time she was noted to be pressured, hyper religious and tangential.  Unfortunately the patient was diagnosed with COVID-19, and remained in the emergency department until 5/14.  She was transferred to our facility on that date.  Objective: Patient seen and examined. Patient is a 59 year old female with the above-stated past psychiatric history who is seen in follow-up. She is essentially unchanged from last time I saw her on 5/16. She remains psychotic, tangential, disorganized. On 5/16 he started Tegretol 100 mg p.o. twice daily as well as Risperdal 1 mg p.o. daily and 2 mg p.o. nightly. This does not who appear to have done anything at this point. If anything rather than being sedated and lethargic in the bed she is much more tangential now. Her vital signs are stable, she is afebrile. She did not sleep last night. Her TSH from 5/15 was 1.282. She has not allowed [place] to do an Accu-Chek, and she has refused follow-up labs for the abnormalities previously noted. She apparently does have a guardian ad litem. Unfortunately with all the data we collected we still have no information on previous medication she may have been treated with successfully at home. Trying to get additional history from her is not productive. She continues to be tangential, avoiding the questions directly, hyper religious, and with odd behaviors. She has shampoo rubbed on the floor and some particular design, she is taken food and inserted it into her plastic water bottles. She is storing other food containers and herself in her room. She is unable to explain why she is doing that outside of "there are drapes that you could put over things like  this".  Principal Problem: Schizoaffective disorder ([place]) Diagnosis: Principal Problem:   Schizoaffective disorder ([place])  Total Time spent with patient: 30 minutes  Past Psychiatric History: See admission H&P  Past Medical History:  Past Medical History:  Diagnosis Date  . Hypertension   . Schizoaffective disorder [facility])     Past Surgical History:  Procedure Laterality Date  . CHOLECYSTECTOMY  2006   Family History:  Family History  Problem Relation Age of Onset  . Coronary artery disease Father        died 100 yo  . Diabetes Mother        died 17  . Hypertension Brother    Family Psychiatric  History: See admission H&P Social History:  Social History   Substance and Sexual Activity  Alcohol Use No     Social History   Substance and Sexual Activity  Drug Use No    Social History   Socioeconomic History  . Marital status: Married    Spouse name: Hoy Morn  . Number of children: Not on file  . Years of education: Not on file  . Highest education level: Not on file  Occupational History  . Occupation: homemaker  Tobacco Use  . Smoking status: Never Smoker  . Smokeless tobacco: Never Used  Substance and Sexual Activity  . Alcohol use: No  . Drug use: No  . Sexual activity: Not Currently  Other Topics Concern  . Not on file  Social History Narrative   Husband - [place] -2010 granddaughte  Pets -  Pit bulls #4            Social Determinants of Health   Financial Resource Strain:   . Difficulty of Paying Living Expenses:   Food Insecurity:   . Worried About Programme researcher, broadcasting/film/video in the Last Year:   . Barista in the Last Year:   Transportation Needs:   . Freight forwarder (Medical):   Marland Kitchen Lack of Transportation (Non-Medical):   Physical Activity:   . Days of Exercise per Week:   . Minutes of Exercise per Session:   Stress:   . Feeling of Stress :   Social Connections:   . Frequency of  Communication with Friends and Family:   . Frequency of Social Gatherings with Friends and Family:   . Attends Religious Services:   . Active Member of Clubs or Organizations:   . Attends Banker Meetings:   Marland Kitchen Marital Status:    Additional Social History:                         Sleep: Poor  Appetite:  Good  Current Medications: Current Facility-Administered Medications  Medication Dose Route Frequency Provider Last Rate Last Admin  . acetaminophen (TYLENOL) tablet 650 mg  650 mg Oral Q4H PRN Rankin, Shuvon B, NP      . alum & mag hydroxide-simeth (MAALOX/MYLANTA) 200-200-20 MG/5ML suspension 30 mL  30 mL Oral Q6H PRN Rankin, Shuvon B, NP      . amLODipine (NORVASC) tablet 10 mg  10 mg Oral Daily Antonieta Pert, MD   10 mg at 01/30/20 6195  . carbamazepine (TEGRETOL) chewable tablet 100 mg  100 mg Oral BID Antonieta Pert, MD   100 mg at 01/30/20 0932  . diphenhydrAMINE (BENADRYL) capsule 50 mg  50 mg Oral Q6H PRN Money, Gerlene Burdock, FNP   50 mg at 01/26/20 1715   Or  . diphenhydrAMINE (BENADRYL) injection 50 mg  50 mg Intramuscular Q6H PRN Money, Feliz Beam B, FNP      . haloperidol (HALDOL) tablet 5 mg  5 mg Oral Q6H PRN Money, Gerlene Burdock, FNP       Or  . haloperidol lactate (HALDOL) injection 10 mg  10 mg Intramuscular Q6H PRN Money, Feliz Beam B, FNP      . hydrochlorothiazide (MICROZIDE) capsule 12.5 mg  12.5 mg Oral Daily Money, Travis B, FNP   12.5 mg at 01/30/20 0926  . LORazepam (ATIVAN) tablet 2 mg  2 mg Oral Q6H PRN Money, Gerlene Burdock, FNP       Or  . LORazepam (ATIVAN) injection 2 mg  2 mg Intramuscular Q6H PRN Money, Gerlene Burdock, FNP      . risperiDONE (RISPERDAL M-TABS) disintegrating tablet 2 mg  2 mg Oral Q8H PRN Antonieta Pert, MD       And  . LORazepam (ATIVAN) tablet 1 mg  1 mg Oral PRN Antonieta Pert, MD       And  . ziprasidone (GEODON) injection 20 mg  20 mg Intramuscular PRN Antonieta Pert, MD      . risperiDONE (RISPERDAL M-TABS)  disintegrating tablet 1 mg  1 mg Oral Daily Antonieta Pert, MD   1 mg at 01/30/20 6712  . risperiDONE (RISPERDAL M-TABS) disintegrating tablet 2 mg  2 mg Oral QHS Antonieta Pert, MD      . traZODone (DESYREL) tablet 50 mg  50 mg Oral  QHS PRN Rankin, Shuvon B, NP        Lab Results: No results found for this or any previous visit (from the past 48 hour(s)).  Blood Alcohol level:  Lab Results  Component Value Date   ETH <5 02/25/2015    Metabolic Disorder Labs: Lab Results  Component Value Date   HGBA1C 6.4 (H) 01/27/2020   MPG 136.98 01/27/2020   No results found for: PROLACTIN Lab Results  Component Value Date   CHOL 163 01/27/2020   TRIG 46 01/27/2020   HDL 47 01/27/2020   CHOLHDL 3.5 01/27/2020   VLDL 9 01/27/2020   LDLCALC 107 (H) 01/27/2020   LDLCALC 138 (H) 11/17/2007    Physical Findings: AIMS: Facial and Oral Movements Muscles of Facial Expression: None, normal Lips and Perioral Area: None, normal Jaw: None, normal Tongue: None, normal,Extremity Movements Upper (arms, wrists, hands, fingers): None, normal Lower (legs, knees, ankles, toes): None, normal, Trunk Movements Neck, shoulders, hips: None, normal, Overall Severity Severity of abnormal movements (highest score from questions above): None, normal Incapacitation due to abnormal movements: None, normal Patient's awareness of abnormal movements (rate only patient's report): No Awareness, Dental Status Current problems with teeth and/or dentures?: No Does patient usually wear dentures?: No  CIWA:    COWS:     Musculoskeletal: Strength & Muscle Tone: within normal limits Gait & Station: normal Patient leans: N/A  Psychiatric Specialty Exam: Physical Exam  Nursing note and vitals reviewed. Constitutional: She appears well-developed and well-nourished.  HENT:  Head: Normocephalic and atraumatic.  Respiratory: Effort normal.  Neurological: She is alert.    Review of Systems  Blood pressure  (!) 124/98, pulse 93, temperature 98.3 F (36.8 C), temperature source Oral, resp. rate 20, SpO2 100 %.There is no height or weight on file to calculate BMI.  General Appearance: Casual  Eye Contact:  Fair  Speech:  Pressured  Volume:  Normal  Mood:  Dysphoric and Euphoric  Affect:  Labile  Thought Process:  Goal Directed and Descriptions of Associations: Tangential  Orientation:  Negative  Thought Content:  Delusions, Rumination and Tangential  Suicidal Thoughts:  No  Homicidal Thoughts:  No  Memory:  Immediate;   Poor Recent;   Poor Remote;   Poor  Judgement:  Impaired  Insight:  Lacking  Psychomotor Activity:  Increased  Concentration:  Concentration: Poor and Attention Span: Poor  Recall:  Poor  Fund of Knowledge:  Poor  Language:  Good  Akathisia:  Negative  Handed:  Right  AIMS (if indicated):     Assets:  Desire for Improvement Resilience  ADL's:  Impaired  Cognition:  WNL  Sleep:  Number of Hours: 0     Treatment Plan Summary: Daily contact with patient to assess and evaluate symptoms and progress in treatment, Medication management and Plan : Patient is seen and examined. Patient is a 59 year old female with the above-stated past psychiatric history who is seen in follow-up.   Diagnosis: #1 schizoaffective disorder bipolar type versus bipolar disorder, currently manic with psychotic features, #2 hypertension, #3 recent COVID-19 positive test, #4 diabetes mellitus type 2  Patient is seen in follow-up. She still remains significantly ill. She is not sleeping, she is tangential, she is delusional, hyper religious. I'm not sure we are getting medications in her system. I am going to write for an increase in the Risperdal of 3 mg p.o. daily and 4 mg p.o. nightly. I am going to increase her Tegretol to 300 mg p.o. twice daily.  If she refuses the Risperdal we will give her haloperidol IM. We will attempt to contact the family again for any additional psychiatric history  especially with regard to any medications that may have been previously successful. She remains on her antihypertensive medications, as well as the trazodone. No other changes in her medications today. If we find any of the antipsychotics to be beneficial it may be good to give her a long-acting formulation given her noncompliance issues.  1. Continue amlodipine 10 mg p.o. daily for hypertension. 2. Increase Tegretol to 300 mg p.o. twice daily for mood stability. 3. Increase Risperdal to 2 mg p.o. daily and 4 mg p.o. nightly for psychosis. 4. If patient refuses oral Risperdal then Haldol 10 mg IM twice daily. 5. Continue hydrochlorothiazide 12.5 mg p.o. daily for hypertension. 6. Continue trazodone 50 mg p.o. nightly as needed insomnia. 7. Social work to contact family with regard to any previous psychiatric medications or psychiatric admissions. 8. Disposition planning-in progress.  Antonieta Pert, MD 01/30/2020, 1:47 PM

## 2020-01-30 NOTE — Progress Notes (Signed)
   01/30/20 0930  Psych Admission Type (Psych Patients Only)  Admission Status Involuntary  Psychosocial Assessment  Patient Complaints Disorientation  Eye Contact Fair  Facial Expression Flat  Affect Anxious;Preoccupied  Speech Logical/coherent;Pressured;Tangential  Interaction Assertive;Forwards little  Motor Activity Other (Comment) (WNL)  Appearance/Hygiene Unremarkable;In hospital gown  Behavior Characteristics Guarded;Calm  Mood Suspicious  Thought Process  Coherency Disorganized;Flight of ideas;Tangential  Content Preoccupation;Religiosity  Delusions Religious  Perception WDL  Hallucination None reported or observed  Judgment Poor  Confusion Mild  Danger to Self  Current suicidal ideation? Denies  Danger to Others  Danger to Others None reported or observed

## 2020-01-30 NOTE — Progress Notes (Signed)
Psychoeducational Group Note  Date:  01/30/2020 Time:  2024  Group Topic/Focus:  Wrap-Up Group:   The focus of this group is to help patients review their daily goal of treatment and discuss progress on daily workbooks.  Participation Level: Did Not Attend  Participation Quality:  Not Applicable  Affect:  Not Applicable  Cognitive:  Not Applicable  Insight:  Not Applicable  Engagement in Group: Not Applicable  Additional Comments: The patient did not attend group this evening.   Hazle Coca S 01/30/2020, 8:24 PM

## 2020-01-30 NOTE — Progress Notes (Signed)
Received report from [facility] that pt has been up most if not all of the shift. Pt did not leave her room Has been sitting on the bench in the room and at times, praying aloud.

## 2020-01-31 NOTE — Plan of Care (Signed)
Progress note  D: pt found in bed; compliant with oral medications. Pt continues to be religiously preoccupied and tangential in speech. Pt is pleasant but dominating in conversation. Pt continues to be reclusive to their room. Pt's edema is still present but the pt refuses to allow staff to assess. Pt denies si/hi/ah/vh and verbally agrees to approach staff if these become apparent or before harming themself/others while at [facility].  A: Pt provided support and encouragement. Pt given medication per protocol and standing orders. Q41m safety checks implemented and continued.  R: Pt safe on the unit. Will continue to monitor.  Pt progressing in the following metrics  Problem: Education: Goal: Knowledge of [place] [facility] Education information/materials will improve Outcome: Progressing Goal: Emotional status will improve Outcome: Progressing Goal: Verbalization of understanding the information provided will improve Outcome: Progressing   Problem: Activity: Goal: Sleeping patterns will improve Outcome: Progressing

## 2020-01-31 NOTE — [facility] Counselor (Signed)
CSW attempted to complete PSA, however pt was participating in recreational group.  Ruthann Cancer MSW, [employer] Clincal Social Worker  [facility]

## 2020-01-31 NOTE — Progress Notes (Signed)
   01/30/20 2300  Psych Admission Type (Psych Patients Only)  Admission Status Involuntary  Psychosocial Assessment  Patient Complaints None  Eye Contact Fair  Facial Expression Flat  Affect Anxious;Preoccupied  Speech Logical/coherent;Pressured;Tangential  Interaction Assertive  Motor Activity Other (Comment) (WNL)  Appearance/Hygiene Unremarkable;In hospital gown  Behavior Characteristics Anxious;Guarded  Mood Suspicious;Pleasant  Thought Process  Coherency Disorganized;Flight of ideas;Tangential  Content Preoccupation;Religiosity;Delusions  Delusions Religious  Perception WDL  Hallucination None reported or observed  Judgment Poor  Confusion Mild  Danger to Self  Current suicidal ideation? Denies  Danger to Others  Danger to Others None reported or observed   Pt exhibiting religious delusions. Will start a conversation coherent and then the religiosity begins.

## 2020-01-31 NOTE — Progress Notes (Signed)
[facility] MD Progress Note  01/31/2020 2:24 PM Sally Wang  MRN:  [id number] Subjective:  Patient is a 59 year old female with a past psychiatric history significant for schizoaffective disorder who originally presented to the [facility] emergency department on 01/20/2020. At that time she was noted to be pressured, hyper religious and tangential. Unfortunately the patient was diagnosed with COVID-19, and remained in the emergency department until 5/14. She was transferred to our facility on that date.  Objective: Patient is seen and examined.  Patient is a 59 year old female with the above-stated past psychiatric history who is seen in follow-up.  She remains hyper religious and tangential but less so today.  She is able to be interrupted and stop talking.  Her blood pressure is still mildly elevated.  She is at 133/93.  Originally her pulse was 97, but repeat was 126.  She slept 7.5 hours last night.  Her lipid panel was obtained on 5/15 and it showed an elevated LDL, but was otherwise normal.  TSH on 5/15 was 1.282.  Yesterday her Tegretol was increased and her Risperdal was increased.  Principal Problem: Schizoaffective disorder (HCC) Diagnosis: Principal Problem:   Schizoaffective disorder (HCC)  Total Time spent with patient: 20 minutes  Past Psychiatric History:   Past Medical History:  Past Medical History:  Diagnosis Date  . Hypertension   . Schizoaffective disorder [facility])     Past Surgical History:  Procedure Laterality Date  . CHOLECYSTECTOMY  2006   Family History:  Family History  Problem Relation Age of Onset  . Coronary artery disease Father        died 49 yo  . Diabetes Mother        died 61  . Hypertension Brother    Family Psychiatric  History: See admission H&P Social History:  Social History   Substance and Sexual Activity  Alcohol Use No     Social History   Substance and Sexual Activity  Drug Use No    Social History   Socioeconomic  History  . Marital status: Married    Spouse name: [name]  . Number of children: Not on file  . Years of education: Not on file  . Highest education level: Not on file  Occupational History  . Occupation: homemaker  Tobacco Use  . Smoking status: Never Smoker  . Smokeless tobacco: Never Used  Substance and Sexual Activity  . Alcohol use: No  . Drug use: No  . Sexual activity: Not Currently  Other Topics Concern  . Not on file  Social History Narrative   Husband - Winda Summerall [address] -1987   [name] -2010 granddaughte      Pets -  Pit bulls #4            Social Determinants of Health   Financial Resource Strain:   . Difficulty of Paying Living Expenses:   Food Insecurity:   . Worried About Programme researcher, broadcasting/film/video in the Last Year:   . Barista in the Last Year:   Transportation Needs:   . Freight forwarder (Medical):   Marland Kitchen Lack of Transportation (Non-Medical):   Physical Activity:   . Days of Exercise per Week:   . Minutes of Exercise per Session:   Stress:   . Feeling of Stress :   Social Connections:   . Frequency of Communication with Friends and Family:   . Frequency of Social Gatherings with Friends and Family:   .  Attends Religious Services:   . Active Member of Clubs or Organizations:   . Attends Archivist Meetings:   Marland Kitchen Marital Status:    Additional Social History:                         Sleep: Good  Appetite:  Good  Current Medications: Current Facility-Administered Medications  Medication Dose Route Frequency Provider Last Rate Last Admin  . acetaminophen (TYLENOL) tablet 650 mg  650 mg Oral Q4H PRN Rankin, Shuvon B, NP      . alum & mag hydroxide-simeth (MAALOX/MYLANTA) 200-200-20 MG/5ML suspension 30 mL  30 mL Oral Q6H PRN Rankin, Shuvon B, NP      . amLODipine (NORVASC) tablet 10 mg  10 mg Oral Daily Sharma Covert, MD   10 mg at 01/31/20 0958  . carbamazepine (TEGRETOL) chewable tablet 300 mg  300 mg  Oral BID Sharma Covert, MD   300 mg at 01/31/20 0958  . diphenhydrAMINE (BENADRYL) capsule 50 mg  50 mg Oral Q6H PRN Money, Lowry Ram, FNP   50 mg at 01/26/20 1715   Or  . diphenhydrAMINE (BENADRYL) injection 50 mg  50 mg Intramuscular Q6H PRN Money, Darnelle Maffucci B, FNP      . haloperidol (HALDOL) tablet 5 mg  5 mg Oral Q6H PRN Money, Lowry Ram, FNP       Or  . haloperidol lactate (HALDOL) injection 10 mg  10 mg Intramuscular Q6H PRN Money, Darnelle Maffucci B, FNP      . haloperidol lactate (HALDOL) injection 10 mg  10 mg Intramuscular BID Sharma Covert, MD   Stopped at 01/31/20 1006  . hydrochlorothiazide (MICROZIDE) capsule 12.5 mg  12.5 mg Oral Daily Money, Lowry Ram, FNP   12.5 mg at 01/31/20 0958  . LORazepam (ATIVAN) tablet 2 mg  2 mg Oral Q6H PRN Money, Lowry Ram, FNP       Or  . LORazepam (ATIVAN) injection 2 mg  2 mg Intramuscular Q6H PRN Money, Lowry Ram, FNP      . risperiDONE (RISPERDAL M-TABS) disintegrating tablet 2 mg  2 mg Oral Q8H PRN Sharma Covert, MD       And  . LORazepam (ATIVAN) tablet 1 mg  1 mg Oral PRN Sharma Covert, MD       And  . ziprasidone (GEODON) injection 20 mg  20 mg Intramuscular PRN Sharma Covert, MD      . risperiDONE (RISPERDAL M-TABS) disintegrating tablet 2 mg  2 mg Oral Daily Sharma Covert, MD   2 mg at 01/31/20 0958  . risperiDONE (RISPERDAL M-TABS) disintegrating tablet 4 mg  4 mg Oral QHS Sharma Covert, MD      . traZODone (DESYREL) tablet 50 mg  50 mg Oral QHS PRN Rankin, Shuvon B, NP        Lab Results: No results found for this or any previous visit (from the past 75 hour(s)).  Blood Alcohol level:  Lab Results  Component Value Date   ETH <5 23/76/2831    Metabolic Disorder Labs: Lab Results  Component Value Date   HGBA1C 6.4 (H) 01/27/2020   MPG 136.98 01/27/2020   No results found for: PROLACTIN Lab Results  Component Value Date   CHOL 163 01/27/2020   TRIG 46 01/27/2020   HDL 47 01/27/2020   CHOLHDL 3.5  01/27/2020   VLDL 9 01/27/2020   LDLCALC 107 (H) 01/27/2020   [place]  138 (H) 11/17/2007    Physical Findings: AIMS: Facial and Oral Movements Muscles of Facial Expression: None, normal Lips and Perioral Area: None, normal Jaw: None, normal Tongue: None, normal,Extremity Movements Upper (arms, wrists, hands, fingers): None, normal Lower (legs, knees, ankles, toes): None, normal, Trunk Movements Neck, shoulders, hips: None, normal, Overall Severity Severity of abnormal movements (highest score from questions above): None, normal Incapacitation due to abnormal movements: None, normal Patient's awareness of abnormal movements (rate only patient's report): No Awareness, Dental Status Current problems with teeth and/or dentures?: No Does patient usually wear dentures?: No  CIWA:    COWS:     Musculoskeletal: Strength & Muscle Tone: within normal limits Gait & Station: normal Patient leans: N/A  Psychiatric Specialty Exam: Physical Exam  Nursing note and vitals reviewed. Constitutional: She appears well-developed and well-nourished.  HENT:  Head: Normocephalic and atraumatic.  Respiratory: Effort normal.  Neurological: She is alert.    Review of Systems  Blood pressure (!) 133/93, pulse (!) 126, temperature 97.9 F (36.6 C), temperature source Oral, resp. rate 20, SpO2 100 %.There is no height or weight on file to calculate BMI.  General Appearance: Casual  Eye Contact:  Fair  Speech:  Pressured  Volume:  Normal  Mood:  Euphoric  Affect:  Labile  Thought Process:  Goal Directed and Descriptions of Associations: Tangential  Orientation:  Negative  Thought Content:  Tangential  Suicidal Thoughts:  No  Homicidal Thoughts:  No  Memory:  Immediate;   Fair Recent;   Fair Remote;   Fair  Judgement:  Impaired  Insight:  Fair  Psychomotor Activity:  Increased  Concentration:  Concentration: Poor and Attention Span: Poor  Recall:  [employer] of Knowledge:  Fair  Language:   Fair  Akathisia:  Negative  Handed:  Right  AIMS (if indicated):     Assets:  Desire for Improvement Resilience  ADL's:  Intact  Cognition:  WNL  Sleep:  Number of Hours: 7.5     Treatment Plan Summary: Daily contact with patient to assess and evaluate symptoms and progress in treatment, Medication management and Plan : Patient is seen and examined.  Patient is a 59 year old female with the above-stated past psychiatric history who is seen in follow-up.   Diagnosis: #1 schizoaffective disorder bipolar type versus bipolar disorder, currently manic with psychotic features, #2 hypertension, #3 recent COVID-19 positive test, #4 diabetes mellitus type 2  She continues to slowly improve, but is still tangential and hyper religious.  She is sleeping better.  I have not been a change in your medicines today.  We will keep the Tegretol at 300 mg p.o. twice daily and the Risperdal 2 mg p.o. daily and 4 mg p.o. nightly.  Hopefully she will continue to slow down.  She has refused to let us check her blood sugar and she denies that she has diabetes.  Her blood pressure seems to be relatively stable.  No other changes in her medicines today.  She will need a Tegretol level, CBC with differential and metabolic panel prior to discharge.  1.  Continue amlodipine 10 mg p.o. daily for hypertension. 2.  Continue Tegretol 300 mg p.o. twice daily for mood stability. 3.  Continue Haldol 5 mg p.o. or IM every 6 hours as needed agitation. 4.  Continue Haldol 10 mg IM twice daily if she refuses Risperdal. 5.  Continue hydrochlorothiazide 12.5 mg p.o. daily for hypertension. 6.  Continue agitation protocol as needed as listed. 7.  Continue  Risperdal 2 mg p.o. daily and 4 mg p.o. nightly for mood stability and psychosis. 8.  Continue trazodone 50 mg p.o. nightly as needed insomnia. 9.  Tegretol level, liver function enzymes and CBC with differential prior to discharge. 10.  Disposition planning-in progress.  Antonieta Pert, MD 01/31/2020, 2:24 PM

## 2020-01-31 NOTE — Progress Notes (Signed)
Pt refused CBG testing this morning.

## 2020-02-01 MED ORDER — RISPERIDONE 1 MG PO TBDP
1.0000 mg | ORAL_TABLET | Freq: Every day | ORAL | Status: DC
  Administered 2020-02-02: 1 mg via ORAL
  Filled 2020-02-01 (×3): qty 1

## 2020-02-01 MED ORDER — TRAZODONE HCL 100 MG PO TABS
100.0000 mg | ORAL_TABLET | Freq: Every day | ORAL | Status: DC
  Filled 2020-02-01 (×2): qty 1

## 2020-02-01 NOTE — Progress Notes (Signed)
   02/01/20 2100  Psych Admission Type (Psych Patients Only)  Admission Status Involuntary  Psychosocial Assessment  Patient Complaints Irritability  Eye Contact Fair  Facial Expression Pensive  Affect Preoccupied  Speech Logical/coherent;Tangential  Interaction Assertive  Motor Activity Slow  Appearance/Hygiene Poor hygiene;In hospital gown  Behavior Characteristics Anxious  Mood Preoccupied;Pleasant  Thought Process  Coherency Disorganized;Flight of ideas  Content Delusions;Preoccupation;Paranoia  Delusions Religious  Perception WDL  Hallucination None reported or observed  Judgment Limited  Confusion None  Danger to Self  Current suicidal ideation? Denies  Danger to Others  Danger to Others None reported or observed   Pt seen in room. As soon as this Clinical research associate walked in with medication, pt refused to take it before anything was said. Pt opted to take the Haldol injection. Pt states she is feeling fine and is "not crazy. If I start hearing or seeing things, I will let you know."

## 2020-02-01 NOTE — Progress Notes (Signed)
Recreation Therapy Notes  Date: 5.20.21 Time: 0955 Location: [address] Dayroom  Group Topic: Communication, Team Building, Problem Solving  Goal Area(s) Addresses:  Patient will effectively work with peer towards shared goal.  Patient will identify skill used to make activity successful.  Patient will identify how skills used during activity can be used to reach post d/c goals.   Intervention: STEM Activity   Activity: In team's, patients were given 15 straws and 2ft of masking tape.  Patients were to build Wang free standing bridge that could hold 300 piece puzzle box.  Education: Social Skills, Discharge Planning.   Education Outcome: Acknowledges education/In group clarification offered/Needs additional education.   Clinical Observations/Feedback: Pt did not attend group session.     Linford Quintela, LRT/CTRS         Sally Wang 02/01/2020 12:00 PM 

## 2020-02-01 NOTE — Progress Notes (Signed)
[facility] MD Progress Note  02/01/2020 1:00 PM KALLEY NICHOLL  MRN:  [id number] Subjective:  Patient is a 59 year old female with a past psychiatric history significant for schizoaffective disorder who originally presented to the [facility] emergency department on 01/20/2020. At that time she was noted to be pressured, hyper religious and tangential. Unfortunately the patient was diagnosed with COVID-19, and remained in the emergency department until 5/14. She was transferred to our facility on that date.  Objective: Patient is seen and examined.  Patient is a 59 year old female with the above-stated past psychiatric history who is seen in follow-up.  Her hyper religiosity and tangentiality continues to slowly improve.  She remains isolated for most of the day.  Her room is much neater than it was yesterday.  Her blood pressures mildly elevated today earlier it was 124/91, repeat was 143/90.  She is afebrile.  She still only slept 2 hours last night.  She is spending a great deal of time sleeping during the day.  No new laboratories.  She denied auditory or visual hallucinations.  She denied suicidal or homicidal ideation.  Principal Problem: Schizoaffective disorder (HCC) Diagnosis: Principal Problem:   Schizoaffective disorder (HCC)  Total Time spent with patient: 20 minutes  Past Psychiatric History: See admission H&P  Past Medical History:  Past Medical History:  Diagnosis Date  . Hypertension   . Schizoaffective disorder [facility])     Past Surgical History:  Procedure Laterality Date  . CHOLECYSTECTOMY  2006   Family History:  Family History  Problem Relation Age of Onset  . Coronary artery disease Father        died 71 yo  . Diabetes Mother        died 1  . Hypertension Brother    Family Psychiatric  History: See admission H&P Social History:  Social History   Substance and Sexual Activity  Alcohol Use No     Social History   Substance and Sexual Activity   Drug Use No    Social History   Socioeconomic History  . Marital status: Married    Spouse name: [name]  . Number of children: Not on file  . Years of education: Not on file  . Highest education level: Not on file  Occupational History  . Occupation: homemaker  Tobacco Use  . Smoking status: Never Smoker  . Smokeless tobacco: Never Used  Substance and Sexual Activity  . Alcohol use: No  . Drug use: No  . Sexual activity: Not Currently  Other Topics Concern  . Not on file  Social History Narrative   Husband - Jacquelinne Speak [address] -1987   [name] -2010 [address]      Pets -  Pit bulls #4            Social Determinants of Health   Financial Resource Strain:   . Difficulty of Paying Living Expenses:   Food Insecurity:   . Worried About Programme researcher, broadcasting/film/video in the Last Year:   . Barista in the Last Year:   Transportation Needs:   . Freight forwarder (Medical):   Marland Kitchen Lack of Transportation (Non-Medical):   Physical Activity:   . Days of Exercise per Week:   . Minutes of Exercise per Session:   Stress:   . Feeling of Stress :   Social Connections:   . Frequency of Communication with Friends and Family:   . Frequency of Social Gatherings with Friends  and Family:   . Attends Religious Services:   . Active Member of Clubs or Organizations:   . Attends Banker Meetings:   Marland Kitchen Marital Status:    Additional Social History:                         Sleep: Poor  Appetite:  Good  Current Medications: Current Facility-Administered Medications  Medication Dose Route Frequency Provider Last Rate Last Admin  . acetaminophen (TYLENOL) tablet 650 mg  650 mg Oral Q4H PRN Rankin, Shuvon B, NP      . alum & mag hydroxide-simeth (MAALOX/MYLANTA) 200-200-20 MG/5ML suspension 30 mL  30 mL Oral Q6H PRN Rankin, Shuvon B, NP      . amLODipine (NORVASC) tablet 10 mg  10 mg Oral Daily Antonieta Pert, MD   10 mg at 02/01/20 0813  .  carbamazepine (TEGRETOL) chewable tablet 300 mg  300 mg Oral BID Antonieta Pert, MD   300 mg at 02/01/20 0813  . diphenhydrAMINE (BENADRYL) capsule 50 mg  50 mg Oral Q6H PRN Money, Gerlene Burdock, FNP   50 mg at 01/26/20 1715   Or  . diphenhydrAMINE (BENADRYL) injection 50 mg  50 mg Intramuscular Q6H PRN Money, Feliz Beam B, FNP      . haloperidol (HALDOL) tablet 5 mg  5 mg Oral Q6H PRN Money, Gerlene Burdock, FNP       Or  . haloperidol lactate (HALDOL) injection 10 mg  10 mg Intramuscular Q6H PRN Money, Feliz Beam B, FNP      . haloperidol lactate (HALDOL) injection 10 mg  10 mg Intramuscular BID Antonieta Pert, MD   Stopped at 01/31/20 1006  . hydrochlorothiazide (MICROZIDE) capsule 12.5 mg  12.5 mg Oral Daily Money, Gerlene Burdock, FNP   12.5 mg at 02/01/20 0813  . LORazepam (ATIVAN) tablet 2 mg  2 mg Oral Q6H PRN Money, Gerlene Burdock, FNP       Or  . LORazepam (ATIVAN) injection 2 mg  2 mg Intramuscular Q6H PRN Money, Gerlene Burdock, FNP      . risperiDONE (RISPERDAL M-TABS) disintegrating tablet 2 mg  2 mg Oral Q8H PRN Antonieta Pert, MD       And  . LORazepam (ATIVAN) tablet 1 mg  1 mg Oral PRN Antonieta Pert, MD       And  . ziprasidone (GEODON) injection 20 mg  20 mg Intramuscular PRN Antonieta Pert, MD      . risperiDONE (RISPERDAL M-TABS) disintegrating tablet 2 mg  2 mg Oral Daily Antonieta Pert, MD   2 mg at 02/01/20 0813  . risperiDONE (RISPERDAL M-TABS) disintegrating tablet 4 mg  4 mg Oral QHS Antonieta Pert, MD   4 mg at 01/31/20 2156  . traZODone (DESYREL) tablet 50 mg  50 mg Oral QHS PRN Rankin, Shuvon B, NP        Lab Results: No results found for this or any previous visit (from the past 48 hour(s)).  Blood Alcohol level:  Lab Results  Component Value Date   ETH <5 02/25/2015    Metabolic Disorder Labs: Lab Results  Component Value Date   HGBA1C 6.4 (H) 01/27/2020   MPG 136.98 01/27/2020   No results found for: PROLACTIN Lab Results  Component Value Date   CHOL 163  01/27/2020   TRIG 46 01/27/2020   HDL 47 01/27/2020   CHOLHDL 3.5 01/27/2020   VLDL 9 01/27/2020  LDLCALC 107 (H) 01/27/2020   LDLCALC 138 (H) 11/17/2007    Physical Findings: AIMS: Facial and Oral Movements Muscles of Facial Expression: None, normal Lips and Perioral Area: None, normal Jaw: None, normal Tongue: None, normal,Extremity Movements Upper (arms, wrists, hands, fingers): None, normal Lower (legs, knees, ankles, toes): None, normal, Trunk Movements Neck, shoulders, hips: None, normal, Overall Severity Severity of abnormal movements (highest score from questions above): None, normal Incapacitation due to abnormal movements: None, normal Patient's awareness of abnormal movements (rate only patient's report): No Awareness, Dental Status Current problems with teeth and/or dentures?: No Does patient usually wear dentures?: No  CIWA:    COWS:     Musculoskeletal: Strength & Muscle Tone: within normal limits Gait & Station: normal Patient leans: N/A  Psychiatric Specialty Exam: Physical Exam  Nursing note and vitals reviewed. Constitutional: She is oriented to person, place, and time. She appears well-developed and well-nourished.  HENT:  Head: Normocephalic and atraumatic.  Respiratory: Effort normal.  Neurological: She is alert and oriented to person, place, and time.    Review of Systems  Blood pressure (!) 143/90, pulse (!) 106, temperature 97.9 F (36.6 C), temperature source Oral, resp. rate 20, SpO2 100 %.There is no height or weight on file to calculate BMI.  General Appearance: Casual  Eye Contact:  Fair  Speech:  Normal Rate  Volume:  Decreased  Mood:  Euthymic  Affect:  Congruent  Thought Process:  Goal Directed and Descriptions of Associations: Tangential  Orientation:  Full (Time, Place, and Person)  Thought Content:  Delusions and Tangential  Suicidal Thoughts:  No  Homicidal Thoughts:  No  Memory:  Immediate;   Fair Recent;   Fair Remote;    Fair  Judgement:  Intact  Insight:  Lacking  Psychomotor Activity:  Decreased  Concentration:  Concentration: Fair and Attention Span: Fair  Recall:  [employer] of Knowledge:  Fair  Language:  Fair  Akathisia:  Negative  Handed:  Right  AIMS (if indicated):     Assets:  Desire for Improvement Resilience  ADL's:  Intact  Cognition:  WNL  Sleep:  Number of Hours: 2     Treatment Plan Summary: Daily contact with patient to assess and evaluate symptoms and progress in treatment, Medication management and Plan : Patient is seen and examined.  Patient is a 59 year old female with the above-stated past psychiatric history who is seen in follow-up.   Diagnosis: #1 schizoaffective disorder bipolar type versus bipolar disorder, currently manic with psychotic features, #2 hypertension, #3 recent COVID-19 positive test, #4 diabetes mellitus type 2   Patient is seen in follow-up.  She is still not sleeping well.  She failed to improve his sleep with multiple antipsychotics while she was in the emergency department.  Since she has been on the floor she has been on Risperdal which was somewhat effective, and Haldol.  She currently is on 4 mg of Risperdal at bedtime.  Do not feel really good about increasing the Risperdal.  I will increase her trazodone to a standing dosage of 100 mg p.o. nightly.  I will decrease the Risperdal during the daytime down to 1 mg to decrease her daytime sedation.  I will go on and order her Tegretol level, liver function enzymes and CBC in the a.m. tomorrow.  Hopefully she will allow [place] to do that.  She has refused an EKG as well as checking her blood sugar given her new diagnosis of diabetes mellitus type 2.  I  have also asked social work to look into where she would go after admission.  1.  Continue Tegretol 300 mg p.o. twice daily for mood stability. 2.  Continue Haldol 5 mg every 6 hours as needed IM or p.o. agitation. 3.  Continue Haldol 10 mg IM twice daily if patient  refuses oral Risperdal. 4.  Continue lorazepam 2 mg every 6 hours as needed agitation or anxiety. 5.  Continue agitation protocol. 6.  Decrease daytime Risperdal 1 mg but continue 4 mg p.o. nightly for psychosis and mood stability. 7.  Increase trazodone to 100 mg and place standing for insomnia. 8.  Tegretol level, CBC with differential and liver function enzymes in a.m. tomorrow. 9.  Disposition planning-in progress.  Sharma Covert, MD 02/01/2020, 1:00 PM

## 2020-02-01 NOTE — [facility] Counselor (Signed)
CSW spoke with attorney and Guardian Ad Bertis Ruddy, [phone]). Guardian Ad Litem reports that this afternoon at 3pm patient's husband, Krystol Rocco is now patient's interim guardian. CSW provided fax number for documentation.  Guardian ad litem requested a virtual meeting with patient and CSW. Guardian ad litem would like to schedule this meeting for Tuesday, 05/25 at 1:30pm via WebEx. CSW shared this meeting time with [facility] Supervisor.  Enid Cutter, MSW, LCSW-A Clinical Social Worker [facility]

## 2020-02-01 NOTE — Plan of Care (Signed)
Progress note  D: pt found in bed; compliant with oral medications. Pt continues to be religiously preoccupied but is pleasant and seems like they are becoming aware of the need for medication. Pt has been more ambulatory at meals, but continues to be reclusive to their room. Pt denies si/hi/ah/vh and verbally agrees to approach staff if these become apparent or before harming themself/others while at [facility].  A: Pt provided support and encouragement. Pt given medication per protocol and standing orders. Q61m safety checks implemented and continued.  R: Pt safe on the unit. Will continue to monitor.  Pt progressing in the following metrics  Problem: Education: Goal: Mental status will improve Outcome: Progressing   Problem: Activity: Goal: Interest or engagement in activities will improve Outcome: Progressing   Problem: Coping: Goal: Ability to verbalize frustrations and anger appropriately will improve Outcome: Progressing Goal: Ability to demonstrate self-control will improve Outcome: Progressing

## 2020-02-01 NOTE — Progress Notes (Signed)
   02/01/20 0000  Psych Admission Type (Psych Patients Only)  Admission Status Involuntary  Psychosocial Assessment  Patient Complaints None  Eye Contact Fair  Facial Expression Other (Comment) (pleasant, smiling)  Affect Anxious;Appropriate to circumstance  Speech Pressured;Tangential  Interaction Cautious;Isolative  Motor Activity Slow  Appearance/Hygiene In scrubs  Behavior Characteristics Cooperative;Anxious  Mood Preoccupied;Pleasant  Thought Process  Coherency Disorganized;Flight of ideas  Content Delusions;Preoccupation;Religiosity  Delusions Religious  Perception WDL  Hallucination None reported or observed  Judgment Impaired  Confusion Mild  Danger to Self  Current suicidal ideation? Denies  Danger to Others  Danger to Others None reported or observed  Pt. Met in her room, she presents as cautious and anxious but is calm and cooperative.  Pt. Compliant with PO medication. Medication as ordered, q 15 minute checks continued and safety maintained.

## 2020-02-02 LAB — COMPREHENSIVE METABOLIC PANEL
ALT: 16 U/L (ref 0–44)
AST: 15 U/L (ref 15–41)
Albumin: 3.4 g/dL — ABNORMAL LOW (ref 3.5–5.0)
Alkaline Phosphatase: 70 U/L (ref 38–126)
Anion gap: 9 (ref 5–15)
BUN: 19 mg/dL (ref 6–20)
CO2: 28 mmol/L (ref 22–32)
Calcium: 8.9 mg/dL (ref 8.9–10.3)
Chloride: 102 mmol/L (ref 98–111)
Creatinine, Ser: 0.95 mg/dL (ref 0.44–1.00)
GFR calc Af Amer: >60 mL/min (ref >60)
GFR calc non Af Amer: >60 mL/min (ref >60)
Glucose, Bld: 144 mg/dL — ABNORMAL HIGH (ref 70–99)
Potassium: 4.2 mmol/L (ref 3.5–5.1)
Sodium: 139 mmol/L (ref 135–145)
Total Bilirubin: 0.3 mg/dL (ref 0.3–1.2)
Total Protein: 6.9 g/dL (ref 6.5–8.1)

## 2020-02-02 LAB — CBC WITH DIFFERENTIAL/PLATELET
Abs Immature Granulocytes: 0.02 10*3/uL (ref 0.00–0.07)
Basophils Absolute: 0 10*3/uL (ref 0.0–0.1)
Basophils Relative: 0 %
Eosinophils Absolute: 0.1 10*3/uL (ref 0.0–0.5)
Eosinophils Relative: 3 %
HCT: 35.5 % — ABNORMAL LOW (ref 36.0–46.0)
Hemoglobin: 10.7 g/dL — ABNORMAL LOW (ref 12.0–15.0)
Immature Granulocytes: 0 %
Lymphocytes Relative: 31 %
Lymphs Abs: 1.5 10*3/uL (ref 0.7–4.0)
MCH: 25.9 pg — ABNORMAL LOW (ref 26.0–34.0)
MCHC: 30.1 g/dL (ref 30.0–36.0)
MCV: 86 fL (ref 80.0–100.0)
Monocytes Absolute: 0.4 10*3/uL (ref 0.1–1.0)
Monocytes Relative: 7 %
Neutro Abs: 2.9 10*3/uL (ref 1.7–7.7)
Neutrophils Relative %: 59 %
Platelets: 219 10*3/uL (ref 150–400)
RBC: 4.13 MIL/uL (ref 3.87–5.11)
RDW: 20.3 % — ABNORMAL HIGH (ref 11.5–15.5)
WBC: 5 10*3/uL (ref 4.0–10.5)
nRBC: 0 % (ref 0.0–0.2)

## 2020-02-02 MED ORDER — HYDROCHLOROTHIAZIDE 25 MG PO TABS
25.0000 mg | ORAL_TABLET | Freq: Every day | ORAL | Status: DC
  Administered 2020-02-03 – 2020-02-05 (×3): 25 mg via ORAL
  Filled 2020-02-02 (×5): qty 1

## 2020-02-02 MED ORDER — HALOPERIDOL LACTATE 5 MG/ML IJ SOLN
15.0000 mg | Freq: Two times a day (BID) | INTRAMUSCULAR | Status: DC
  Administered 2020-02-04: 15 mg via INTRAMUSCULAR
  Filled 2020-02-02 (×10): qty 3

## 2020-02-02 MED ORDER — RISPERIDONE 2 MG PO TBDP
2.0000 mg | ORAL_TABLET | Freq: Every day | ORAL | Status: DC
  Administered 2020-02-02 – 2020-02-05 (×4): 2 mg via ORAL
  Filled 2020-02-02 (×3): qty 1
  Filled 2020-02-02: qty 2
  Filled 2020-02-02 (×3): qty 1
  Filled 2020-02-02: qty 2

## 2020-02-02 MED ORDER — POTASSIUM CHLORIDE CRYS ER 20 MEQ PO TBCR
20.0000 meq | EXTENDED_RELEASE_TABLET | Freq: Once | ORAL | Status: AC
  Administered 2020-02-02: 20 meq via ORAL
  Filled 2020-02-02 (×2): qty 1

## 2020-02-02 MED ORDER — TRAZODONE HCL 100 MG PO TABS
200.0000 mg | ORAL_TABLET | Freq: Every day | ORAL | Status: DC
  Administered 2020-02-02: 200 mg via ORAL
  Filled 2020-02-02 (×3): qty 2

## 2020-02-02 MED ORDER — RISPERIDONE 2 MG PO TBDP
6.0000 mg | ORAL_TABLET | Freq: Every day | ORAL | Status: DC
  Administered 2020-02-02 – 2020-02-03 (×2): 6 mg via ORAL
  Filled 2020-02-02 (×5): qty 3

## 2020-02-02 NOTE — Tx Team (Signed)
Interdisciplinary Treatment and Diagnostic Plan Update  02/02/2020 Time of Session: 9:10am Sally Wang MRN: [id number]  Principal Diagnosis: Schizoaffective disorder (HCC)  Secondary Diagnoses: Principal Problem:   Schizoaffective disorder (HCC)   Current Medications:  Current Facility-Administered Medications  Medication Dose Route Frequency Provider Last Rate Last Admin  . acetaminophen (TYLENOL) tablet 650 mg  650 mg Oral Q4H PRN Rankin, Shuvon B, NP      . alum & mag hydroxide-simeth (MAALOX/MYLANTA) 200-200-20 MG/5ML suspension 30 mL  30 mL Oral Q6H PRN Rankin, Shuvon B, NP      . amLODipine (NORVASC) tablet 10 mg  10 mg Oral Daily Antonieta Pert, MD   10 mg at 02/02/20 0810  . carbamazepine (TEGRETOL) chewable tablet 300 mg  300 mg Oral BID Antonieta Pert, MD   300 mg at 02/02/20 0810  . diphenhydrAMINE (BENADRYL) capsule 50 mg  50 mg Oral Q6H PRN Money, Gerlene Burdock, FNP   50 mg at 01/26/20 1715   Or  . diphenhydrAMINE (BENADRYL) injection 50 mg  50 mg Intramuscular Q6H PRN Money, Feliz Beam B, FNP      . haloperidol (HALDOL) tablet 5 mg  5 mg Oral Q6H PRN Money, Gerlene Burdock, FNP       Or  . haloperidol lactate (HALDOL) injection 10 mg  10 mg Intramuscular Q6H PRN Money, Feliz Beam B, FNP      . haloperidol lactate (HALDOL) injection 10 mg  10 mg Intramuscular BID Antonieta Pert, MD   10 mg at 02/01/20 2106  . hydrochlorothiazide (MICROZIDE) capsule 12.5 mg  12.5 mg Oral Daily Money, Gerlene Burdock, FNP   12.5 mg at 02/02/20 3833  . LORazepam (ATIVAN) tablet 2 mg  2 mg Oral Q6H PRN Money, Gerlene Burdock, FNP       Or  . LORazepam (ATIVAN) injection 2 mg  2 mg Intramuscular Q6H PRN Money, Gerlene Burdock, FNP      . risperiDONE (RISPERDAL M-TABS) disintegrating tablet 2 mg  2 mg Oral Q8H PRN Antonieta Pert, MD       And  . LORazepam (ATIVAN) tablet 1 mg  1 mg Oral PRN Antonieta Pert, MD       And  . ziprasidone (GEODON) injection 20 mg  20 mg Intramuscular PRN Antonieta Pert, MD      .  risperiDONE (RISPERDAL M-TABS) disintegrating tablet 1 mg  1 mg Oral Daily Antonieta Pert, MD   1 mg at 02/02/20 3832  . risperiDONE (RISPERDAL M-TABS) disintegrating tablet 4 mg  4 mg Oral QHS Antonieta Pert, MD   4 mg at 01/31/20 2156  . traZODone (DESYREL) tablet 200 mg  200 mg Oral QHS Jola Babinski Marlane Mingle, MD       PTA Medications: Medications Prior to Admission  Medication Sig Dispense Refill Last Dose  . amLODipine (NORVASC) 5 MG tablet Take 1 tablet (5 mg total) by mouth daily. 30 tablet 0     Patient Stressors: Health problems Medication change or noncompliance  Patient Strengths: Supportive family/friends  Treatment Modalities: Medication Management, Group therapy, Case management,  1 to 1 session with clinician, Psychoeducation, Recreational therapy.   Physician Treatment Plan for Primary Diagnosis: Schizoaffective disorder (HCC) Long Term Goal(s): Improvement in symptoms so as ready for discharge Improvement in symptoms so as ready for discharge   Short Term Goals: Ability to identify changes in lifestyle to reduce recurrence of condition will improve Ability to verbalize feelings will improve Ability to disclose and discuss  suicidal ideas Ability to demonstrate self-control will improve Ability to identify and develop effective coping behaviors will improve Ability to maintain clinical measurements within normal limits will improve Compliance with prescribed medications will improve Ability to identify changes in lifestyle to reduce recurrence of condition will improve Ability to verbalize feelings will improve Ability to disclose and discuss suicidal ideas Ability to demonstrate self-control will improve Ability to identify and develop effective coping behaviors will improve Ability to maintain clinical measurements within normal limits will improve Compliance with prescribed medications will improve  Medication Management: Evaluate patient's response, side  effects, and tolerance of medication regimen.  Therapeutic Interventions: 1 to 1 sessions, Unit Group sessions and Medication administration.  Evaluation of Outcomes: Progressing  Physician Treatment Plan for Secondary Diagnosis: Principal Problem:   Schizoaffective disorder ([place])  Long Term Goal(s): Improvement in symptoms so as ready for discharge Improvement in symptoms so as ready for discharge   Short Term Goals: Ability to identify changes in lifestyle to reduce recurrence of condition will improve Ability to verbalize feelings will improve Ability to disclose and discuss suicidal ideas Ability to demonstrate self-control will improve Ability to identify and develop effective coping behaviors will improve Ability to maintain clinical measurements within normal limits will improve Compliance with prescribed medications will improve Ability to identify changes in lifestyle to reduce recurrence of condition will improve Ability to verbalize feelings will improve Ability to disclose and discuss suicidal ideas Ability to demonstrate self-control will improve Ability to identify and develop effective coping behaviors will improve Ability to maintain clinical measurements within normal limits will improve Compliance with prescribed medications will improve     Medication Management: Evaluate patient's response, side effects, and tolerance of medication regimen.  Therapeutic Interventions: 1 to 1 sessions, Unit Group sessions and Medication administration.  Evaluation of Outcomes: Progressing   RN Treatment Plan for Primary Diagnosis: Schizoaffective disorder ([place]) Long Term Goal(s): Knowledge of disease and therapeutic regimen to maintain health will improve  Short Term Goals: Ability to participate in decision making will improve, Ability to verbalize feelings will improve, Ability to identify and develop effective coping behaviors will improve and Compliance with prescribed  medications will improve  Medication Management: RN will administer medications as ordered by provider, will assess and evaluate patient's response and provide education to patient for prescribed medication. RN will report any adverse and/or side effects to prescribing provider.  Therapeutic Interventions: 1 on 1 counseling sessions, Psychoeducation, Medication administration, Evaluate responses to treatment, Monitor vital signs and CBGs as ordered, Perform/monitor CIWA, COWS, AIMS and Fall Risk screenings as ordered, Perform wound care treatments as ordered.  Evaluation of Outcomes: Progressing   LCSW Treatment Plan for Primary Diagnosis: Schizoaffective disorder ([place]) Long Term Goal(s): Safe transition to appropriate next level of care at discharge, Engage patient in therapeutic group addressing interpersonal concerns.  Short Term Goals: Engage patient in aftercare planning with referrals and resources, Increase ability to appropriately verbalize feelings, Facilitate acceptance of mental health diagnosis and concerns and Increase skills for wellness and recovery  Therapeutic Interventions: Assess for all discharge needs, 1 to 1 time with Social worker, Explore available resources and support systems, Assess for adequacy in community support network, Educate family and significant other(s) on suicide prevention, Complete Psychosocial Assessment, Interpersonal group therapy.  Evaluation of Outcomes: Progressing   Progress in Treatment: Attending groups: No. Participating in groups: No. Taking medication as prescribed: Yes. Toleration medication: Yes. Family/Significant other contact made: Yes, individual(s) contacted:  daughter, Willeen Niece Patient understands diagnosis:  No. Discussing patient identified problems/goals with staff: No. Medical problems stabilized or resolved: Yes. Denies suicidal/homicidal ideation: Yes. Issues/concerns per patient self-inventory: No.   New problem(s)  identified: No, Describe:  none  New Short Term/Long Term Goal(s):  Patient Goals:  Patient did not attend.   Discharge Plan or Barriers:   Reason for Continuation of Hospitalization: Delusions  Medication stabilization  Estimated Length of Stay:  3-5 days  Attendees: Patient:  02/02/2020   Physician:  02/02/2020   Nursing:  02/02/2020   RN Care Manager: 02/02/2020   Social Worker: Ruthann Cancer, LCSWA 02/02/2020   Recreational Therapist:  02/02/2020   Other:  02/02/2020   Other:  02/02/2020   Other: 02/02/2020     Scribe for Treatment Team: Otelia Santee, LCSWA 02/02/2020 9:10 AM

## 2020-02-02 NOTE — Progress Notes (Signed)
SPIRITUALITY GROUP NOTE  Spirituality group facilitated by Chaplain Hyacinth Marcelli, MDiv, BCC.  Group Description:  Group focused on topic of hope.  Patients participated in facilitated discussion around topic, connecting with one another around experiences and definitions for hope.  Group members engaged with visual explorer photos, reflecting on what hope looks like for them today.  Group engaged in discussion around how their definitions of hope are present today in hospital.   Modalities: Psycho-social ed, Adlerian, Narrative, MI Patient Progress: DID NOT ATTEND  

## 2020-02-02 NOTE — Plan of Care (Signed)
Progress note  D: pt found in bed; compliant with medication administration. Pt was labile with this writer at first, stating they just wanted IM medications. Pt eventually came around and accepted oral medications. Pt continues to be religiously preoccupied, and states they aren't sick. Pt did state that they can see why someone would think they were though. Pt denies si/hi/ah/vh and verbally agrees to approach staff if these become apparent or before harming themself/others while at [facility].  A: Pt provided support and encouragement. Pt given medication per protocol and standing orders. Q89m safety checks implemented and continued.  R: Pt safe on the unit. Will continue to monitor.  Pt progressing in the following metrics  Problem: Health Behavior/Discharge Planning: Goal: Identification of resources available to assist in meeting health care needs will improve Outcome: Progressing Goal: Compliance with treatment plan for underlying cause of condition will improve Outcome: Progressing   Problem: Physical Regulation: Goal: Ability to maintain clinical measurements within normal limits will improve Outcome: Progressing   Problem: Safety: Goal: Periods of time without injury will increase Outcome: Progressing

## 2020-02-02 NOTE — Progress Notes (Signed)
This writer walked into pt room to give her meds. Pt immediately stated "I refuse all meds." Pt informed that this meant Haldol given by IM injection. Pt agreed to injection. Haldol 10 mg given by IM injection to right deltoid. Pt tolerated well. Will continue to monitor.

## 2020-02-02 NOTE — Progress Notes (Addendum)
Pt refused CBG this morning. Lab already left building before pt got up and came down the hall. Reschedule for afternoon.

## 2020-02-02 NOTE — Progress Notes (Signed)
[facility] MD Progress Note  02/02/2020 12:10 PM Sally Wang  MRN:  [id number] Subjective:  Patient is a 59 year old female with a past psychiatric history significant for schizoaffective disorder who originally presented to the [facility] emergency department on 01/20/2020. At that time she was noted to be pressured, hyper religious and tangential. Unfortunately the patient was diagnosed with COVID-19, and remained in the emergency department until 5/14. She was transferred to our facility on that date.  Objective: Patient is seen and examined.  Patient is a 59 year old female with the above-stated past psychiatric history who is seen in follow-up.  She again has refused to get her laboratories done this morning.  She stated that after she had decided to get them done the lab he will work on.  She still is hyper religious to a degree, and mildly irritable.  Her blood pressure remains elevated.  She is afebrile.  Sleep is again a major issue for her, and she only slept 4.25 hours last night.  This is despite Tegretol and Risperdal.  She continues to eat a great deal, but has refused to allow [place] to look at her blood sugar.  She does have type 2 diabetes, but her hemoglobin A1c on admission was only 6.4.  She refused her Risperdal last night and received the Haldol injection.  I suspect it will take combination treatment to get her under control.  Apparently she now has her husband is the guardian ad litem.  Principal Problem: Schizoaffective disorder (HCC) Diagnosis: Principal Problem:   Schizoaffective disorder (HCC)  Total Time spent with patient: 20 minutes  Past Psychiatric History: See admission H&P  Past Medical History:  Past Medical History:  Diagnosis Date  . Hypertension   . Schizoaffective disorder [facility])     Past Surgical History:  Procedure Laterality Date  . CHOLECYSTECTOMY  2006   Family History:  Family History  Problem Relation Age of Onset  . Coronary artery  disease Father        died 108 yo  . Diabetes Mother        died 1  . Hypertension Brother    Family Psychiatric  History: See admission H&P Social History:  Social History   Substance and Sexual Activity  Alcohol Use No     Social History   Substance and Sexual Activity  Drug Use No    Social History   Socioeconomic History  . Marital status: Married    Spouse name: [name]  . Number of children: Not on file  . Years of education: Not on file  . Highest education level: Not on file  Occupational History  . Occupation: homemaker  Tobacco Use  . Smoking status: Never Smoker  . Smokeless tobacco: Never Used  Substance and Sexual Activity  . Alcohol use: No  . Drug use: No  . Sexual activity: Not Currently  Other Topics Concern  . Not on file  Social History Narrative   Husband - Carolyna Yerian [address] -1987   [name] -2010 granddaughte      Pets -  Pit bulls #4            Social Determinants of Health   Financial Resource Strain:   . Difficulty of Paying Living Expenses:   Food Insecurity:   . Worried About Programme researcher, broadcasting/film/video in the Last Year:   . [employer] of Food in the Last Year:   Transportation Needs:   . Lack of  Transportation (Medical):   Marland Kitchen Lack of Transportation (Non-Medical):   Physical Activity:   . Days of Exercise per Week:   . Minutes of Exercise per Session:   Stress:   . Feeling of Stress :   Social Connections:   . Frequency of Communication with Friends and Family:   . Frequency of Social Gatherings with Friends and Family:   . Attends Religious Services:   . Active Member of Clubs or Organizations:   . Attends Banker Meetings:   Marland Kitchen Marital Status:    Additional Social History:                         Sleep: Fair  Appetite:  Good  Current Medications: Current Facility-Administered Medications  Medication Dose Route Frequency Provider Last Rate Last Admin  . acetaminophen (TYLENOL) tablet 650 mg   650 mg Oral Q4H PRN Rankin, Shuvon B, NP      . alum & mag hydroxide-simeth (MAALOX/MYLANTA) 200-200-20 MG/5ML suspension 30 mL  30 mL Oral Q6H PRN Rankin, Shuvon B, NP      . amLODipine (NORVASC) tablet 10 mg  10 mg Oral Daily Antonieta Pert, MD   10 mg at 02/02/20 0810  . carbamazepine (TEGRETOL) chewable tablet 300 mg  300 mg Oral BID Antonieta Pert, MD   300 mg at 02/02/20 0810  . diphenhydrAMINE (BENADRYL) capsule 50 mg  50 mg Oral Q6H PRN Money, Gerlene Burdock, FNP   50 mg at 01/26/20 1715   Or  . diphenhydrAMINE (BENADRYL) injection 50 mg  50 mg Intramuscular Q6H PRN Money, Feliz Beam B, FNP      . haloperidol (HALDOL) tablet 5 mg  5 mg Oral Q6H PRN Money, Gerlene Burdock, FNP       Or  . haloperidol lactate (HALDOL) injection 10 mg  10 mg Intramuscular Q6H PRN Money, Feliz Beam B, FNP      . haloperidol lactate (HALDOL) injection 10 mg  10 mg Intramuscular BID Antonieta Pert, MD   10 mg at 02/01/20 2106  . hydrochlorothiazide (MICROZIDE) capsule 12.5 mg  12.5 mg Oral Daily Money, Gerlene Burdock, FNP   12.5 mg at 02/02/20 1694  . LORazepam (ATIVAN) tablet 2 mg  2 mg Oral Q6H PRN Money, Gerlene Burdock, FNP       Or  . LORazepam (ATIVAN) injection 2 mg  2 mg Intramuscular Q6H PRN Money, Gerlene Burdock, FNP      . risperiDONE (RISPERDAL M-TABS) disintegrating tablet 2 mg  2 mg Oral Q8H PRN Antonieta Pert, MD       And  . LORazepam (ATIVAN) tablet 1 mg  1 mg Oral PRN Antonieta Pert, MD       And  . ziprasidone (GEODON) injection 20 mg  20 mg Intramuscular PRN Antonieta Pert, MD      . risperiDONE (RISPERDAL M-TABS) disintegrating tablet 1 mg  1 mg Oral Daily Antonieta Pert, MD   1 mg at 02/02/20 5038  . risperiDONE (RISPERDAL M-TABS) disintegrating tablet 4 mg  4 mg Oral QHS Antonieta Pert, MD   4 mg at 01/31/20 2156  . traZODone (DESYREL) tablet 200 mg  200 mg Oral QHS Antonieta Pert, MD        Lab Results: No results found for this or any previous visit (from the past 48  hour(s)).  Blood Alcohol level:  Lab Results  Component Value Date   [facility] <5 02/25/2015  Metabolic Disorder Labs: Lab Results  Component Value Date   HGBA1C 6.4 (H) 01/27/2020   MPG 136.98 01/27/2020   No results found for: PROLACTIN Lab Results  Component Value Date   CHOL 163 01/27/2020   TRIG 46 01/27/2020   HDL 47 01/27/2020   CHOLHDL 3.5 01/27/2020   VLDL 9 01/27/2020   LDLCALC 107 (H) 01/27/2020   LDLCALC 138 (H) 11/17/2007    Physical Findings: AIMS: Facial and Oral Movements Muscles of Facial Expression: None, normal Lips and Perioral Area: None, normal Jaw: None, normal Tongue: None, normal,Extremity Movements Upper (arms, wrists, hands, fingers): None, normal Lower (legs, knees, ankles, toes): None, normal, Trunk Movements Neck, shoulders, hips: None, normal, Overall Severity Severity of abnormal movements (highest score from questions above): None, normal Incapacitation due to abnormal movements: None, normal Patient's awareness of abnormal movements (rate only patient's report): No Awareness, Dental Status Current problems with teeth and/or dentures?: No Does patient usually wear dentures?: No  CIWA:    COWS:     Musculoskeletal: Strength & Muscle Tone: within normal limits Gait & Station: normal Patient leans: N/A  Psychiatric Specialty Exam: Physical Exam  Nursing note and vitals reviewed. Constitutional: She is oriented to person, place, and time. She appears well-developed and well-nourished.  HENT:  Head: Normocephalic and atraumatic.  Respiratory: Effort normal.  Neurological: She is alert and oriented to person, place, and time.    Review of Systems  Blood pressure (!) 139/103, pulse 99, temperature 97.9 F (36.6 C), temperature source Oral, resp. rate 20, SpO2 100 %.There is no height or weight on file to calculate BMI.  General Appearance: Casual  Eye Contact:  Minimal  Speech:  Pressured  Volume:  Increased  Mood:  Dysphoric and  Irritable  Affect:  Labile  Thought Process:  Goal Directed and Descriptions of Associations: Tangential  Orientation:  Negative  Thought Content:  Delusions, Rumination and Tangential  Suicidal Thoughts:  No  Homicidal Thoughts:  No  Memory:  Immediate;   Fair Recent;   Fair Remote;   Fair  Judgement:  Impaired  Insight:  Lacking  Psychomotor Activity:  Normal  Concentration:  Concentration: Fair and Attention Span: Fair  Recall:  [employer] of Knowledge:  Fair  Language:  Good  Akathisia:  Negative  Handed:  Right  AIMS (if indicated):     Assets:  Desire for Improvement Resilience  ADL's:  Intact  Cognition:  WNL  Sleep:  Number of Hours: 4.25     Treatment Plan Summary: Daily contact with patient to assess and evaluate symptoms and progress in treatment, Medication management and Plan : Patient is seen and examined.  Patient is a 59 year old female with the above-stated past psychiatric history who is seen in follow-up.   Diagnosis: #1 schizoaffective disorder bipolar type versus bipolar disorder, currently manic with psychotic features, #2 hypertension, #3 recent COVID-19 positive test, #4 diabetes mellitus type 2   Patient is seen in follow-up.  She is essentially unchanged.  I am going to increase her Risperdal back to 2 mg p.o. daily and increase her evening dosage to 6 mg p.o. nightly.  We will have to watch for her EKG.  Hopefully she will allow [place] to do that.  We will go on and draw her Tegretol level this afternoon so that we can get that done.  The level may be artificially elevated, but I want to make sure about toxicity.  She continues to refuse to allow [place] to check her  blood sugars, but her hemoglobin A1c is not majorly elevated.  Her husband is now her guardian on [place], and social work will meet with him and the patient via video visit today.  I will increase her Haldol to 15 mg if she refuses the oral Risperdal.  Hopefully that will get her some sleep and decrease  her tangentiality and hyper religious delusions.  1.  Continue amlodipine 10 mg p.o. daily for hypertension. 2.  Continue Tegretol 300 mg p.o. twice daily for mood stability. 3.  Increase Haldol to 15 mg IM twice daily if refuses oral Risperdal. 4.  Increase hydrochlorothiazide to 25 mg p.o. daily for hypertension. 5.  Continue agitation protocol as written. 6.  Increase Risperdal to 2 mg p.o. daily and 6 mg p.o. nightly for psychosis and mood stability. 7.  Continue trazodone 200 mg p.o. nightly for insomnia. 8.  Get Tegretol level, liver function enzymes and a CBC this p.m. 9.  Disposition planning-in progress. Antonieta Pert, MD 02/02/2020, 12:10 PM

## 2020-02-02 NOTE — [facility] Counselor (Signed)
CSW attempted to reach patient's husband, Ricca Melgarejo [phone]. Per attorney Jodie Echevaria, the patient's husband is her interm guardian. CSW has not received guardianship paperwork at this time.  CSW left a HIPAA compliant voicemail with callback information.  Enid Cutter, MSW, LCSW-A Clinical Social Worker [facility]

## 2020-02-02 NOTE — Progress Notes (Signed)
Pt continues to be religiously preoccupied, but pt was compliant with PO medications    02/02/20 2100  Psych Admission Type (Psych Patients Only)  Admission Status Voluntary  Psychosocial Assessment  Patient Complaints None  Eye Contact Fair  Facial Expression Animated;Pensive  Affect Preoccupied  Speech Logical/coherent;Tangential  Interaction Assertive;Dominating  Motor Activity Slow  Appearance/Hygiene Disheveled;Poor hygiene;In scrubs  Behavior Characteristics Cooperative  Mood Suspicious;Preoccupied;Pleasant  Thought Process  Coherency Disorganized;Loose associations;Tangential  Content Delusions;Preoccupation;Paranoia  Delusions Religious  Perception WDL  Hallucination None reported or observed  Judgment Limited  Confusion None  Danger to Self  Current suicidal ideation? Denies  Danger to Others  Danger to Others None reported or observed

## 2020-02-03 LAB — CARBAMAZEPINE LEVEL, TOTAL: Carbamazepine Lvl: 7.3 ug/mL (ref 4.0–12.0)

## 2020-02-03 LAB — GLUCOSE, CAPILLARY: Glucose-Capillary: 78 mg/dL (ref 70–99)

## 2020-02-03 MED ORDER — TRAZODONE HCL 150 MG PO TABS
150.0000 mg | ORAL_TABLET | Freq: Every evening | ORAL | Status: DC | PRN
  Filled 2020-02-03: qty 1

## 2020-02-03 NOTE — [facility] Group Notes (Signed)
  [facility] LCSW Group Therapy Note  Date/Time:  02/03/2020 11:15AM-12:00PM  Type of Therapy and Topic:  Group Therapy:  Feelings About Hospitalization  Participation Level:  Did Not Attend   Description of Group This process group involved patients discussing their feelings related to being hospitalized, as well as the benefits they see to being in the hospital.  These feelings and benefits were itemized.  The group then brainstormed specific ways in which they could seek those same benefits when they discharge and return home.  Therapeutic Goals 1. Patient will identify and describe positive and negative feelings related to hospitalization 2. Patient will verbalize benefits of hospitalization to themselves personally 3. Patients will brainstorm together ways they can obtain similar benefits in the outpatient setting, identify barriers to wellness and possible solutions  Summary of Patient Progress:  The patient was invited to group, did not attend.  Therapeutic Modalities Cognitive Behavioral Therapy Motivational Interviewing    Ambrose Mantle, LCSW 02/03/2020, 3:14 PM

## 2020-02-03 NOTE — Progress Notes (Signed)
   02/03/20 1000  Psych Admission Type (Psych Patients Only)  Admission Status Voluntary  Psychosocial Assessment  Patient Complaints None  Eye Contact Fair  Facial Expression Animated;Pensive  Affect Preoccupied  Speech Logical/coherent;Tangential  Interaction Assertive;Dominating  Motor Activity Slow  Appearance/Hygiene Disheveled;Poor hygiene;In scrubs  Behavior Characteristics Cooperative  Mood Preoccupied  Thought Process  Coherency Disorganized;Loose associations;Tangential  Content Delusions;Preoccupation;Paranoia  Delusions Religious  Perception WDL  Hallucination None reported or observed  Judgment Limited  Confusion None  Danger to Self  Current suicidal ideation? Denies  Danger to Others  Danger to Others None reported or observed

## 2020-02-03 NOTE — Progress Notes (Signed)
   02/03/20 1000  Psych Admission Type (Psych Patients Only)  Admission Status Voluntary  Psychosocial Assessment  Patient Complaints None  Eye Contact Fair  Facial Expression Animated;Pensive  Affect Preoccupied  Speech Logical/coherent;Tangential  Interaction Assertive;Dominating  Motor Activity Slow  Appearance/Hygiene Disheveled;Poor hygiene;In scrubs  Behavior Characteristics Cooperative  Mood Preoccupied  Thought Process  Coherency Disorganized;Loose associations;Tangential  Content Delusions;Preoccupation;Paranoia  Delusions Religious  Perception WDL  Hallucination None reported or observed  Judgment Limited  Confusion None  Danger to Self  Current suicidal ideation? Denies  Danger to Others  Danger to Others None reported or observed   

## 2020-02-03 NOTE — Progress Notes (Signed)
[facility] MD Progress Note  02/03/2020 12:35 PM Sally Wang  MRN:  [id number] Subjective: patient reports she is "all right". Denies medication side effects at this time. Denies suicidal ideations.  Objective: I have reviewed chart notes and have met with patient. 59 y old female, history of Schizoaffective Disorder , presented to ED on 5/8 at which time she presented pressured, hyper religious, with disorganized thought process . She was found to be Covid (+) and was initially managed in ED.  Today patient presents in her room, alert, polite on approach. States she is feeling " all right".Denies feeling depressed and denies suicidal ideations or self injurious ideations. Affect is not irritable at this time, smiles briefly at times during session. She reports she is unsure as to why she is in the hospital. Thought process remains  tangential at times and presents focused on religious/spiritual issues.  Reports " what is usual for me is unusual for others because I have been praying since I was young and I am committed to a lifestyle".  Currently on Tegretol and on Risperidone. Denies medication side effects.  * Labs reviewed- BMP unremarkable ( serum glucose 144- not fasting) . CBC- WBC 5.0  Hgb10.7/HCT 35.5, PLT 219k, diff. Unremarkable . Carbamazepine serum level is therapeutic at 7.3    Principal Problem: Schizoaffective disorder ([place]) Diagnosis: Principal Problem:   Schizoaffective disorder ([place])  Total Time spent with patient: 20 minutes  Past Psychiatric History: See admission H&P  Past Medical History:  Past Medical History:  Diagnosis Date  . Hypertension   . Schizoaffective disorder [facility])     Past Surgical History:  Procedure Laterality Date  . CHOLECYSTECTOMY  2006   Family History:  Family History  Problem Relation Age of Onset  . Coronary artery disease Father        died 50 yo  . Diabetes Mother        died 46  . Hypertension Brother    Family Psychiatric  History:  See admission H&P Social History:  Social History   Substance and Sexual Activity  Alcohol Use No     Social History   Substance and Sexual Activity  Drug Use No    Social History   Socioeconomic History  . Marital status: Married    Spouse name: Hoy Morn  . Number of children: Not on file  . Years of education: Not on file  . Highest education level: Not on file  Occupational History  . Occupation: homemaker  Tobacco Use  . Smoking status: Never Smoker  . Smokeless tobacco: Never Used  Substance and Sexual Activity  . Alcohol use: No  . Drug use: No  . Sexual activity: Not Currently  Other Topics Concern  . Not on file  Social History Narrative   Husband - Anson -2010 granddaughte      Pets -  Pit bulls #4            Social Determinants of Health   Financial Resource Strain:   . Difficulty of Paying Living Expenses:   Food Insecurity:   . Worried About Charity fundraiser in the Last Year:   . Arboriculturist in the Last Year:   Transportation Needs:   . Film/video editor (Medical):   Marland Kitchen Lack of Transportation (Non-Medical):   Physical Activity:   . Days of Exercise per Week:   . Minutes of Exercise per Session:  Stress:   . Feeling of Stress :   Social Connections:   . Frequency of Communication with Friends and Family:   . Frequency of Social Gatherings with Friends and Family:   . Attends Religious Services:   . Active Member of Clubs or Organizations:   . Attends Archivist Meetings:   Marland Kitchen Marital Status:    Additional Social History:   Sleep: Fair  Appetite:  Good  Current Medications: Current Facility-Administered Medications  Medication Dose Route Frequency Provider Last Rate Last Admin  . acetaminophen (TYLENOL) tablet 650 mg  650 mg Oral Q4H PRN Rankin, Shuvon B, NP      . alum & mag hydroxide-simeth (MAALOX/MYLANTA) 200-200-20 MG/5ML suspension 30 mL  30 mL Oral Q6H PRN Rankin,  Shuvon B, NP      . amLODipine (NORVASC) tablet 10 mg  10 mg Oral Daily Sharma Covert, MD   10 mg at 02/03/20 0806  . carbamazepine (TEGRETOL) chewable tablet 300 mg  300 mg Oral BID Sharma Covert, MD   300 mg at 02/03/20 0805  . diphenhydrAMINE (BENADRYL) capsule 50 mg  50 mg Oral Q6H PRN Money, Lowry Ram, FNP   50 mg at 01/26/20 1715   Or  . diphenhydrAMINE (BENADRYL) injection 50 mg  50 mg Intramuscular Q6H PRN Money, Darnelle Maffucci B, FNP      . haloperidol (HALDOL) tablet 5 mg  5 mg Oral Q6H PRN Money, Lowry Ram, FNP       Or  . haloperidol lactate (HALDOL) injection 10 mg  10 mg Intramuscular Q6H PRN Money, Darnelle Maffucci B, FNP      . haloperidol lactate (HALDOL) injection 15 mg  15 mg Intramuscular BID Sharma Covert, MD      . hydrochlorothiazide (HYDRODIURIL) tablet 25 mg  25 mg Oral Daily Sharma Covert, MD   25 mg at 02/03/20 0806  . LORazepam (ATIVAN) tablet 2 mg  2 mg Oral Q6H PRN Money, Lowry Ram, FNP       Or  . LORazepam (ATIVAN) injection 2 mg  2 mg Intramuscular Q6H PRN Money, Lowry Ram, FNP      . risperiDONE (RISPERDAL M-TABS) disintegrating tablet 2 mg  2 mg Oral Q8H PRN Sharma Covert, MD       And  . LORazepam (ATIVAN) tablet 1 mg  1 mg Oral PRN Sharma Covert, MD       And  . ziprasidone (GEODON) injection 20 mg  20 mg Intramuscular PRN Sharma Covert, MD      . risperiDONE (RISPERDAL M-TABS) disintegrating tablet 2 mg  2 mg Oral Daily Sharma Covert, MD   2 mg at 02/03/20 0805  . risperiDONE (RISPERDAL M-TABS) disintegrating tablet 6 mg  6 mg Oral QHS Sharma Covert, MD   6 mg at 02/02/20 2046  . traZODone (DESYREL) tablet 200 mg  200 mg Oral QHS Sharma Covert, MD   200 mg at 02/02/20 2046    Lab Results:  Results for orders placed or performed during the hospital encounter of 01/26/20 (from the past 48 hour(s))  Carbamazepine level, total     Status: None   Collection Time: 02/02/20  6:18 PM  Result Value Ref Range   Carbamazepine Lvl 7.3  4.0 - 12.0 ug/mL    Comment: Performed at [facility], [place] [address], [place], [place] [address]  CBC with Differential/Platelet     Status: Abnormal   Collection Time: 02/02/20  6:18 PM  Result Value Ref Range   WBC 5.0 4.0 - 10.5 K/uL   RBC 4.13 3.87 - 5.11 MIL/uL   Hemoglobin 10.7 (L) 12.0 - 15.0 g/dL   HCT 35.5 (L) 36.0 - 46.0 %   MCV 86.0 80.0 - 100.0 fL   MCH 25.9 (L) 26.0 - 34.0 pg   MCHC 30.1 30.0 - 36.0 g/dL   RDW 20.3 (H) 11.5 - 15.5 %   Platelets 219 150 - 400 K/uL   nRBC 0.0 0.0 - 0.2 %   Neutrophils Relative % 59 %   Neutro Abs 2.9 1.7 - 7.7 K/uL   Lymphocytes Relative 31 %   Lymphs Abs 1.5 0.7 - 4.0 K/uL   Monocytes Relative 7 %   Monocytes Absolute 0.4 0.1 - 1.0 K/uL   Eosinophils Relative 3 %   Eosinophils Absolute 0.1 0.0 - 0.5 K/uL   Basophils Relative 0 %   Basophils Absolute 0.0 0.0 - 0.1 K/uL   Immature Granulocytes 0 %   Abs Immature Granulocytes 0.02 0.00 - 0.07 K/uL    Comment: Performed at [facility], [place] [address], [place], [place] [address]  Comprehensive metabolic panel     Status: Abnormal   Collection Time: 02/02/20  6:18 PM  Result Value Ref Range   Sodium 139 135 - 145 mmol/L   Potassium 4.2 3.5 - 5.1 mmol/L   Chloride 102 98 - 111 mmol/L   CO2 28 22 - 32 mmol/L   Glucose, Bld 144 (H) 70 - 99 mg/dL    Comment: Glucose reference range applies only to samples taken after fasting for at least 8 hours.   BUN 19 6 - 20 mg/dL   Creatinine, Ser 0.95 0.44 - 1.00 mg/dL   Calcium 8.9 8.9 - 10.3 mg/dL   Total Protein 6.9 6.5 - 8.1 g/dL   Albumin 3.4 (L) 3.5 - 5.0 g/dL   AST 15 15 - 41 U/L   ALT 16 0 - 44 U/L   Alkaline Phosphatase 70 38 - 126 U/L   Total Bilirubin 0.3 0.3 - 1.2 mg/dL   GFR calc non Af Amer >60 >60 mL/min   GFR calc Af Amer >60 >60 mL/min   Anion gap 9 5 - 15    Comment: Performed at [facility], [place] [address] [place], [place] [address]  Glucose, capillary     Status: None    Collection Time: 02/03/20  6:13 AM  Result Value Ref Range   Glucose-Capillary 78 70 - 99 mg/dL    Comment: Glucose reference range applies only to samples taken after fasting for at least 8 hours.    Blood Alcohol level:  Lab Results  Component Value Date   ETH <5 09/32/3557    Metabolic Disorder Labs: Lab Results  Component Value Date   HGBA1C 6.4 (H) 01/27/2020   MPG 136.98 01/27/2020   No results found for: PROLACTIN Lab Results  Component Value Date   CHOL 163 01/27/2020   TRIG 46 01/27/2020   HDL 47 01/27/2020   CHOLHDL 3.5 01/27/2020   VLDL 9 01/27/2020   LDLCALC 107 (H) 01/27/2020   LDLCALC 138 (H) 11/17/2007    Physical Findings: AIMS: Facial and Oral Movements Muscles of Facial Expression: None, normal Lips and Perioral Area: None, normal Jaw: None, normal Tongue: None, normal,Extremity Movements Upper (arms, wrists, hands, fingers): None, normal Lower (legs, knees, ankles, toes): None, normal, Trunk Movements Neck, shoulders, hips: None, normal, Overall Severity Severity of  abnormal movements (highest score from questions above): None, normal Incapacitation due to abnormal movements: None, normal Patient's awareness of abnormal movements (rate only patient's report): No Awareness, Dental Status Current problems with teeth and/or dentures?: No Does patient usually wear dentures?: No  CIWA:    COWS:     Musculoskeletal: Strength & Muscle Tone: within normal limits Gait & Station: normal Patient leans: N/A  Psychiatric Specialty Exam: Physical Exam  Nursing note and vitals reviewed. Constitutional: She is oriented to person, place, and time. She appears well-developed and well-nourished.  HENT:  Head: Normocephalic and atraumatic.  Respiratory: Effort normal.  Neurological: She is alert and oriented to person, place, and time.    Review of Systems no chest pain, no shortness of breat at room air, no vomiting   Blood pressure 133/83, pulse (!) 121,  temperature (!) 97.5 F (36.4 C), temperature source Oral, resp. rate 20, SpO2 100 %.There is no height or weight on file to calculate BMI.  General Appearance: Casual  Eye Contact:  Good  Speech:  Normal Rate  Volume:  Normal  Mood:  denies feeling depressed   Affect:  vaguely guarded, improves during session, smiles briefly at times  Thought Process:  Disorganized and Descriptions of Associations: Tangential  Orientation:  Other:  alert and attentive  Thought Content:  denies hallucinations, some religious ruminations  Suicidal Thoughts:  No denies suicidal or self injurious ideations, denies HI   Homicidal Thoughts:  No  Memory:  recent and remote grossly intact   Judgement:  Fair  Insight:  limited   Psychomotor Activity:  Decreased  Concentration:  Concentration: Fair and Attention Span: Fair  Recall:  [employer] of Knowledge:  Fair  Language:  Good  Akathisia:  Negative  Handed:  Right  AIMS (if indicated):     Assets:  Desire for Improvement Resilience  ADL's:  Intact  Cognition:  WNL  Sleep:  Number of Hours: 4.25   Assessment -  27 y old female, history of Schizoaffective Disorder , presented to ED on 5/8 at which time she presented pressured, hyper religious, with disorganized thought process . She was found to be Covid (+) and was initially managed in ED.  Today patient present alert , attentive, without psychomotor agitation. Today affect appears to be more reactive and is not irritable at this time. Denies feeling depressed or SI.  Thought process remains tangential with religious preoccupations. Denies medication side effects. Labs reviewed- serum carbamazepine level within therapeutic at 7.3  Have reviewed medication side effects, including risk of sedation and of orthostatic hypotension. Currently denies dizziness or lightheadedness .  Treatment Plan Summary: Treatment Plan reviewed as below today 5/22  Encourage group and milieu participation  Treatment Team  working on disposition planning options- chart notes indicate patient's husband is now interim guardian   1.  Continue amlodipine 10 mg p.o. daily for hypertension. 2.  Continue Tegretol 300 mg p.o. twice daily for mood stability. 3.  Continue Haldol  15 mg IM twice daily if refuses oral Risperdal. 4. Continue hydrochlorothiazide to 25 mg p.o. daily for hypertension ( BP today 139/89, pulse 94) . 5.  Continue agitation protocol as written. 6.  Continue  Risperdal  2 mg p.o. daily and 6 mg p.o. nightly for psychosis and mood stability. 7.  Decrease  Trazodone to 150  mg p.o. nightly PRN for insomnia. 8.  Recheck EKG to monitor QTc ( 5/5 EKG QTc 456)   Jenne Campus, MD 02/03/2020, 12:35  PM   Patient ID: Sally Wang, female   DOB: Nov 02, 1960, 59 y.o.   MRN: [id number]

## 2020-02-04 DIAGNOSIS — F250 Schizoaffective disorder, bipolar type: Secondary | ICD-10-CM

## 2020-02-04 LAB — GLUCOSE, CAPILLARY: Glucose-Capillary: 82 mg/dL (ref 70–99)

## 2020-02-04 NOTE — [facility] Group Notes (Signed)
[facility] LCSW Group Therapy Note  Date/Time:  02/04/2020  11:00AM-12:00PM  Type of Therapy and Topic:  Group Therapy:  Music and Mood  Participation Level:  Did Not Attend   Description of Group: In this process group, members listened to a variety of genres of music and identified that different types of music evoke different responses.  Patients were encouraged to identify music that was soothing for them and music that was energizing for them.  Patients discussed how this knowledge can help with wellness and recovery in various ways including managing depression and anxiety as well as encouraging healthy sleep habits.    Therapeutic Goals: 1. Patients will explore the impact of different varieties of music on mood 2. Patients will verbalize the thoughts they have when listening to different types of music 3. Patients will identify music that is soothing to them as well as music that is energizing to them 4. Patients will discuss how to use this knowledge to assist in maintaining wellness and recovery 5. Patients will explore the use of music as a coping skill  Summary of Patient Progress:  N/A  Therapeutic Modalities: Solution Focused Brief Therapy Activity   Ambrose Mantle, LCSW

## 2020-02-04 NOTE — Progress Notes (Signed)
   02/04/20 2130  Psych Admission Type (Psych Patients Only)  Admission Status Voluntary  Psychosocial Assessment  Patient Complaints None  Eye Contact Fair  Facial Expression Animated;Pensive  Affect Preoccupied  Speech Logical/coherent;Tangential  Interaction Assertive;Dominating  Motor Activity Slow  Appearance/Hygiene Disheveled;Poor hygiene;In scrubs  Behavior Characteristics Appropriate to situation  Mood Pleasant;Preoccupied  Thought Process  Coherency Disorganized;Loose associations;Tangential  Content Obsessions;Religiosity  Delusions Religious  Perception WDL  Hallucination None reported or observed  Judgment Limited  Confusion Mild  Danger to Self  Current suicidal ideation? Denies  Danger to Others  Danger to Others None reported or observed

## 2020-02-04 NOTE — Progress Notes (Signed)
Adult Psychoeducational Group Note  Date:  02/04/2020 Time:  10:25 PM  Group Topic/Focus:  Wrap-Up Group:   The focus of this group is to help patients review their daily goal of treatment and discuss progress on daily workbooks.  Participation Level:  Did Not Attend  Participation Quality:  Did Not Attend  Affect:  Did Not Attend  Cognitive:  Did Not Attend  Insight: None  Engagement in Group:  Did Not Attend  Modes of Intervention:  Did Not Attend  Additional Comments:  Pt did not attend evening wrap up group tonight.   Felipa Furnace 02/04/2020, 10:25 PM

## 2020-02-04 NOTE — [facility] Suicide Risk Assessment (Signed)
[facility] INPATIENT:  Family/Significant Other Suicide Prevention Education  Suicide Prevention Education:  Education Completed; Sally Wang, spouse and interim legal guardian [phone] has been identified by the patient as the family member/significant other with whom the patient will be residing, and identified as the person(s) who will aid the patient in the event of a mental health crisis (suicidal ideations/suicide attempt).  With written consent from the patient, the family member/significant other has been provided the following suicide prevention education, prior to the and/or following the discharge of the patient.  The suicide prevention education provided includes the following:  Suicide risk factors  Suicide prevention and interventions  National Suicide Hotline telephone number  [facility] assessment telephone number  [facility] Emergency Assistance 911  [facility] and/or Residential Mobile Crisis Unit telephone number  Request made of family/significant other to:  Remove weapons (e.g., guns, rifles, knives), all items previously/currently identified as safety concern.    Remove drugs/medications (over-the-counter, prescriptions, illicit drugs), all items previously/currently identified as a safety concern.  The family member/significant other verbalizes understanding of the suicide prevention education information provided.  The family member/significant other agrees to remove the items of safety concern listed above.   Spouse confirms that he was appointed patient's interim legal guardian last week. He asked to speak with a nurse regarding her medication list.   Spouse reports patient used to go to [facility] for medication management, but stopped "a couple years ago." Patient has not seen other providers in the mean time. Spouse is comfortable with patient following up with [facility]. He is agreeable to her returning home at discharge and patient potentially  discharging within the next 24-48 hours.  Spouse was not able to provide much history or collateral information for CSW to complete assessment.   Darreld Mclean 02/04/2020, 10:39 AM

## 2020-02-04 NOTE — BHH Counselor (Signed)
Adult Comprehensive Assessment  Patient ID: Sally Wang, female   DOB: July 31, 1961, 59 y.o.   MRN: [id number]  Information Source: Information source: Patient(Chart review, family collateral)  Current Stressors:  Patient states their primary concerns and needs for treatment are:: Unable to answer Patient states their goals for this hospitilization and ongoing recovery are:: Unable to answer Educational / Learning stressors: Unknown Employment / Job issues: Unemployed Family Relationships: Lives with spouse, has an adult daughter who is involved. Her spouse, Sally Wang was appointed patient's legal guardian on 02/01/2020. Financial / Lack of resources (include bankruptcy): Has Medicare, financial support from spouse. Housing / Lack of housing: No concerns, lives in [place] with spouse. Physical health (include injuries & life threatening diseases): Recently recovered from COVID 19 Social relationships: Unknown Substance abuse: Denies Bereavement / Loss: Unknown  Living/Environment/Situation:  Living Arrangements: Spouse/significant other Living conditions (as described by patient or guardian): Single family home in [place] Who else lives in the home?: Spouse How long has patient lived in current situation?: Unknown What is atmosphere in current home: Comfortable, Supportive  Family History:  Marital status: Married What types of issues is patient dealing with in the relationship?: Per spouse, patient has refused to take medications or attend appointments for several years. Are you sexually active?: Yes What is your sexual orientation?: Straight Has your sexual activity been affected by drugs, alcohol, medication, or emotional stress?: Denies Does patient have children?: Yes How many children?: 2 How is patient's relationship with their children?: 2 daughters, Sally Wang and [place].  Childhood History:  Additional childhood history information: Unable to assess  Employment/Work  Situation:   Employment situation: Unemployed Are There Guns or Other Weapons in Your Home?: No  Financial Resources:   Financial resources: Income from spouse, Medicare Does patient have a representative payee or guardian?: No  Alcohol/Substance Abuse:   What has been your use of drugs/alcohol within the last 12 months?: Denies Alcohol/Substance Abuse Treatment Hx: Past Tx, Inpatient, Past Tx, Outpatient If yes, describe treatment: Reports several inpatient admissions, none recent. Spouse states patient use to receive medication management and therapy from [facility] Has alcohol/substance abuse ever caused legal problems?: No  Social Support System:   Conservation officer, nature Support System: Fair Describe Community Support System: Spouse, daughters Type of faith/religion: Ephriam Knuckles How does patient's faith help to cope with current illness?: Religiously pre-occupied  Leisure/Recreation:   Leisure and Hobbies: Unknown  Discharge Plan:   Currently receiving community mental health services: No Patient states concerns and preferences for aftercare planning are: Spouse, as interim legal guardian, would like for patient to resume medication management through [facility] Patient states they will know when they are safe and ready for discharge when: Guardian is agreeable to patient discharging home in the next 24-48 hours Does patient have access to transportation?: Yes(Spouse will provide transportation) Does patient have financial barriers related to discharge medications?: No Patient description of barriers related to discharge medications: Has [employer] Medicare Will patient be returning to same living situation after discharge?: Yes  Summary/Recommendations:   Summary and Recommendations (to be completed by the evaluator): Sally Wang is a 58 year old female from [place] [facility] [place]), she initially presented to [facility] on 05/08 and remained at [facility] until 05/14 due to being COVID+. Patient has a known  history of schizoaffective disorder and has reportedly been non-compliant with medications for several years. Her spouse, Sally Wang, has been appointed her interim legal guardian. Unfortunately, neither patient nor spouse were able to provide significant collateral information. Spouse requested  patient follow up with [facility] for medication management. While here, Avrianna can benefit from crisis stabilization, medication management, therapeutic milieu, and referrals for services.  Sally [place]. 02/04/2020

## 2020-02-04 NOTE — BHH Counselor (Signed)
CSW attempted to reach patient's husband, Joli Koob [phone]. Per attorney Jodie Echevaria, the patient's husband is her interm guardian. CSW has not received guardianship paperwork at this time.  CSW left a HIPAA compliant voicemail with callback information. A voicemail was also left on 05/21.  Enid Cutter, MSW, LCSW-A Clinical Social Worker [facility]

## 2020-02-04 NOTE — Plan of Care (Signed)
Progress note  D: pt found in bed; compliant with medication administration. Pt continues to be religiously preoccupied, tangential and have loose association with conversation. Pt also is disoriented to day and situation. Pt is pleasant though. Pt continues to have poor hygiene and demonstrate bizarre behavior with hoarding items in their room. Pt denies si/hi/ah/vh and verbally agrees to approach staff if these become apparent or before harming themself/others while at bhh.  A: Pt provided support and encouragement. Pt given medication per protocol and standing orders. Q61m safety checks implemented and continued.  R: Pt safe on the unit. Will continue to monitor.  Pt progressing in the following metrics  Problem: Coping: Goal: Ability to interact with others will improve Outcome: Progressing Goal: Demonstration of participation in decision-making regarding own care will improve Outcome: Progressing Goal: Ability to use eye contact when communicating with others will improve Outcome: Progressing   Problem: Health Behavior/Discharge Planning: Goal: Identification of resources available to assist in meeting health care needs will improve Outcome: Progressing

## 2020-02-04 NOTE — Progress Notes (Signed)
   02/03/20 2055  COVID-19 Daily Checkoff  Have you had a fever (temp > 37.80C/100F)  in the past 24 hours?  No  If you have had runny nose, nasal congestion, sneezing in the past 24 hours, has it worsened? No  COVID-19 EXPOSURE  Have you traveled outside the state in the past 14 days? No  Have you been in contact with someone with a confirmed diagnosis of COVID-19 or PUI in the past 14 days without wearing appropriate PPE? No  Have you been living in the same home as a person with confirmed diagnosis of COVID-19 or a PUI (household contact)? No  Have you been diagnosed with COVID-19? No

## 2020-02-04 NOTE — Progress Notes (Signed)
[facility] MD Progress Note  02/04/2020 1:28 PM [name]  MRN:  [id number]  Subjective: Sally Wang reports, "I'm trying to make the best out of this situation. You know what they said, when life gives you lemon, you make a lemonade. It depends on the spirit. I'm thankful".  Objective: 59 y old female, history of Schizoaffective Disorder, presented to ED on 01/20/20 at which time she presented pressured, hyper religious with disorganized thought process . She was found to be Covid (+) and was initially managed in ED. Sally Wang is seen, chart reviewed. The chart findings discussed with the treatment team. Today patient presents in her room, alert, polite on approach. States, "I'm trying to make the best out of this situation. You know what they said, when life gives you lemon, you make a lemonade. It depends on the spirit. I'm thankful". Denies feeling depressed, denies suicidal ideations or self injurious ideations. Affect is pleasant, smiles briefly at times during this session. She reports she is unsure as to why she is in the hospital. Thought process is logical at times and presents focused on religious/spiritual issues.  Currently on Tegretol and on Risperidone. Denies medication side effects. Labs reviewed- BMP unremarkable (serum glucose 144- not fasting). CBC- WBC 5.0  Hgb10.7/HCT 35.5, PLT 219k, diff. Unremarkable. Carbamazepine serum level is therapeutic at 7.3   Principal Problem: Schizoaffective disorder (HCC)  Diagnosis: Principal Problem:   Schizoaffective disorder (HCC)  Total Time spent with patient: 15 minutes  Past Psychiatric History: See admission H&P  Past Medical History:  Past Medical History:  Diagnosis Date  . Hypertension   . Schizoaffective disorder [facility])     Past Surgical History:  Procedure Laterality Date  . CHOLECYSTECTOMY  2006   Family History:  Family History  Problem Relation Age of Onset  . Coronary artery disease Father        died 61 yo  . Diabetes Mother    died 64  . Hypertension Brother    Family Psychiatric  History: See admission H&P  Social History:  Social History   Substance and Sexual Activity  Alcohol Use No     Social History   Substance and Sexual Activity  Drug Use No    Social History   Socioeconomic History  . Marital status: Married    Spouse name: Sally Wang  . Number of children: Not on file  . Years of education: Not on file  . Highest education level: Not on file  Occupational History  . Occupation: homemaker  Tobacco Use  . Smoking status: Never Smoker  . Smokeless tobacco: Never Used  Substance and Sexual Activity  . Alcohol use: No  . Drug use: No  . Sexual activity: Not Currently  Other Topics Concern  . Not on file  Social History Narrative   Husband - Sally Wang [address] -1987   Sally Wang -2010 granddaughte      Pets -  Pit bulls #4            Social Determinants of Health   Financial Resource Strain:   . Difficulty of Paying Living Expenses:   Food Insecurity:   . Worried About Programme researcher, broadcasting/film/video in the Last Year:   . Barista in the Last Year:   Transportation Needs:   . Freight forwarder (Medical):   Marland Kitchen Lack of Transportation (Non-Medical):   Physical Activity:   . Days of Exercise per Week:   . Minutes of Exercise per  Session:   Stress:   . Feeling of Stress :   Social Connections:   . Frequency of Communication with Friends and Family:   . Frequency of Social Gatherings with Friends and Family:   . Attends Religious Services:   . Active Member of Clubs or Organizations:   . Attends Banker Meetings:   Marland Kitchen Marital Status:    Additional Social History:   Sleep: Fair  Appetite:  Good  Current Medications: Current Facility-Administered Medications  Medication Dose Route Frequency Provider Last Rate Last Admin  . acetaminophen (TYLENOL) tablet 650 mg  650 mg Oral Q4H PRN Rankin, Shuvon B, NP      . alum & mag hydroxide-simeth  (MAALOX/MYLANTA) 200-200-20 MG/5ML suspension 30 mL  30 mL Oral Q6H PRN Rankin, Shuvon B, NP      . amLODipine (NORVASC) tablet 10 mg  10 mg Oral Daily Antonieta Pert, MD   10 mg at 02/04/20 0919  . carbamazepine (TEGRETOL) chewable tablet 300 mg  300 mg Oral BID Antonieta Pert, MD   300 mg at 02/04/20 0919  . diphenhydrAMINE (BENADRYL) capsule 50 mg  50 mg Oral Q6H PRN Money, Gerlene Burdock, FNP   50 mg at 01/26/20 1715   Or  . diphenhydrAMINE (BENADRYL) injection 50 mg  50 mg Intramuscular Q6H PRN Money, Feliz Beam B, FNP      . haloperidol (HALDOL) tablet 5 mg  5 mg Oral Q6H PRN Money, Gerlene Burdock, FNP       Or  . haloperidol lactate (HALDOL) injection 10 mg  10 mg Intramuscular Q6H PRN Money, Feliz Beam B, FNP      . haloperidol lactate (HALDOL) injection 15 mg  15 mg Intramuscular BID Antonieta Pert, MD      . hydrochlorothiazide (HYDRODIURIL) tablet 25 mg  25 mg Oral Daily Antonieta Pert, MD   25 mg at 02/04/20 0919  . LORazepam (ATIVAN) tablet 2 mg  2 mg Oral Q6H PRN Money, Gerlene Burdock, FNP       Or  . LORazepam (ATIVAN) injection 2 mg  2 mg Intramuscular Q6H PRN Money, Gerlene Burdock, FNP      . risperiDONE (RISPERDAL M-TABS) disintegrating tablet 2 mg  2 mg Oral Q8H PRN Antonieta Pert, MD       And  . LORazepam (ATIVAN) tablet 1 mg  1 mg Oral PRN Antonieta Pert, MD       And  . ziprasidone (GEODON) injection 20 mg  20 mg Intramuscular PRN Antonieta Pert, MD      . risperiDONE (RISPERDAL M-TABS) disintegrating tablet 2 mg  2 mg Oral Daily Antonieta Pert, MD   2 mg at 02/04/20 0919  . risperiDONE (RISPERDAL M-TABS) disintegrating tablet 6 mg  6 mg Oral QHS Antonieta Pert, MD   6 mg at 02/03/20 2050  . traZODone (DESYREL) tablet 150 mg  150 mg Oral QHS PRN Cobos, Rockey Situ, MD       Lab Results:  Results for orders placed or performed during the hospital encounter of 01/26/20 (from the past 48 hour(s))  Carbamazepine level, total     Status: None   Collection Time: 02/02/20   6:18 PM  Result Value Ref Range   Carbamazepine Lvl 7.3 4.0 - 12.0 ug/mL    Comment: Performed at [name], [address] [place], [place] [address]  CBC with Differential/Platelet     Status: Abnormal   Collection Time: 02/02/20  6:18 PM  Result Value Ref Range   WBC 5.0 4.0 - 10.5 K/uL   RBC 4.13 3.87 - 5.11 MIL/uL   Hemoglobin 10.7 (L) 12.0 - 15.0 g/dL   HCT 35.5 (L) 36.0 - 46.0 %   MCV 86.0 80.0 - 100.0 fL   MCH 25.9 (L) 26.0 - 34.0 pg   MCHC 30.1 30.0 - 36.0 g/dL   RDW 20.3 (H) 11.5 - 15.5 %   Platelets 219 150 - 400 K/uL   nRBC 0.0 0.0 - 0.2 %   Neutrophils Relative % 59 %   Neutro Abs 2.9 1.7 - 7.7 K/uL   Lymphocytes Relative 31 %   Lymphs Abs 1.5 0.7 - 4.0 K/uL   Monocytes Relative 7 %   Monocytes Absolute 0.4 0.1 - 1.0 K/uL   Eosinophils Relative 3 %   Eosinophils Absolute 0.1 0.0 - 0.5 K/uL   Basophils Relative 0 %   Basophils Absolute 0.0 0.0 - 0.1 K/uL   Immature Granulocytes 0 %   Abs Immature Granulocytes 0.02 0.00 - 0.07 K/uL    Comment: Performed at [facility], [place] [address] [place], [place] [address]  Comprehensive metabolic panel     Status: Abnormal   Collection Time: 02/02/20  6:18 PM  Result Value Ref Range   Sodium 139 135 - 145 mmol/L   Potassium 4.2 3.5 - 5.1 mmol/L   Chloride 102 98 - 111 mmol/L   CO2 28 22 - 32 mmol/L   Glucose, Bld 144 (H) 70 - 99 mg/dL    Comment: Glucose reference range applies only to samples taken after fasting for at least 8 hours.   BUN 19 6 - 20 mg/dL   Creatinine, Ser 0.95 0.44 - 1.00 mg/dL   Calcium 8.9 8.9 - 10.3 mg/dL   Total Protein 6.9 6.5 - 8.1 g/dL   Albumin 3.4 (L) 3.5 - 5.0 g/dL   AST 15 15 - 41 U/L   ALT 16 0 - 44 U/L   Alkaline Phosphatase 70 38 - 126 U/L   Total Bilirubin 0.3 0.3 - 1.2 mg/dL   GFR calc non Af Amer >60 >60 mL/min   GFR calc Af Amer >60 >60 mL/min   Anion gap 9 5 - 15    Comment: Performed at [facility], [place] [address],  [place], [place] [address]  Glucose, capillary     Status: None   Collection Time: 02/03/20  6:13 AM  Result Value Ref Range   Glucose-Capillary 78 70 - 99 mg/dL    Comment: Glucose reference range applies only to samples taken after fasting for at least 8 hours.  Glucose, capillary     Status: None   Collection Time: 02/04/20  6:19 AM  Result Value Ref Range   Glucose-Capillary 82 70 - 99 mg/dL    Comment: Glucose reference range applies only to samples taken after fasting for at least 8 hours.   Blood Alcohol level:  Lab Results  Component Value Date   ETH <5 22/63/3354    Metabolic Disorder Labs: Lab Results  Component Value Date   HGBA1C 6.4 (H) 01/27/2020   MPG 136.98 01/27/2020   No results found for: PROLACTIN Lab Results  Component Value Date   CHOL 163 01/27/2020   TRIG 46 01/27/2020   HDL 47 01/27/2020   CHOLHDL 3.5 01/27/2020   VLDL 9 01/27/2020   LDLCALC 107 (H) 01/27/2020   LDLCALC 138 (H) 11/17/2007   Physical Findings: AIMS: Facial  and Oral Movements Muscles of Facial Expression: None, normal Lips and Perioral Area: None, normal Jaw: None, normal Tongue: None, normal,Extremity Movements Upper (arms, wrists, hands, fingers): None, normal Lower (legs, knees, ankles, toes): None, normal, Trunk Movements Neck, shoulders, hips: None, normal, Overall Severity Severity of abnormal movements (highest score from questions above): None, normal Incapacitation due to abnormal movements: None, normal Patient's awareness of abnormal movements (rate only patient's report): No Awareness, Dental Status Current problems with teeth and/or dentures?: No Does patient usually wear dentures?: No  CIWA:    COWS:     Musculoskeletal: Strength & Muscle Tone: within normal limits Gait & Station: normal Patient leans: N/A  Psychiatric Specialty Exam: Physical Exam  Nursing note and vitals reviewed. Constitutional: She is oriented to person, place, and time. She appears  well-developed and well-nourished.  HENT:  Head: Normocephalic and atraumatic.  Respiratory: Effort normal.  Genitourinary:    Genitourinary Comments: Deferred   Musculoskeletal:     Cervical back: Normal range of motion.  Neurological: She is alert and oriented to person, place, and time.  Skin: Skin is warm.    Review of Systems  Constitutional: Negative for chills, diaphoresis and fever.  HENT: Negative for congestion, rhinorrhea, sneezing and sore throat.   Eyes: Negative for discharge.  Respiratory: Negative for cough, chest tightness, shortness of breath and wheezing.   Cardiovascular: Negative for chest pain and palpitations.  Gastrointestinal: Negative for diarrhea, nausea and vomiting.  Endocrine: Negative for cold intolerance.  Genitourinary: Negative for difficulty urinating.  Musculoskeletal: Negative for arthralgias and myalgias.  Skin: Negative.   Allergic/Immunologic:       Allergies: NKDA  Neurological: Negative for dizziness, tremors, seizures, syncope, numbness and headaches.  Psychiatric/Behavioral: Negative for agitation, behavioral problems, confusion, decreased concentration, dysphoric mood, hallucinations, self-injury, sleep disturbance and suicidal ideas. The patient is not nervous/anxious and is not hyperactive.    no chest pain, no shortness of breat at room air, no vomiting   Blood pressure 126/77, pulse 82, temperature 97.7 F (36.5 C), temperature source Oral, resp. rate 20, SpO2 100 %.There is no height or weight on file to calculate BMI.  General Appearance: Casual  Eye Contact:  Good  Speech:  Normal Rate  Volume:  Normal  Mood:  Denies feeling depressed, says, "I'm trying to make the best of this situation".  Affect:  Appropriate and reactive  Thought Process:  Disorganized and Descriptions of Associations: Tangential  Orientation:  Other:  alert and attentive  Thought Content:  denies hallucinations, some religious ruminations  Suicidal  Thoughts:  No denies suicidal or self injurious ideations.  Homicidal Thoughts:  No, denies.  Memory:  recent and remote grossly intact   Judgement:  Fair  Insight:  limited   Psychomotor Activity:  Decreased  Concentration:  Concentration: Fair and Attention Span: Fair  Recall:  [employer]Fair  Fund of Knowledge:  Fair  Language:  Good  Akathisia:  Negative  Handed:  Right  AIMS (if indicated):     Assets:  Desire for Improvement Resilience  ADL's:  Intact  Cognition:  WNL  Sleep:  Number of Hours: 3.25   Assessment : [age] y old female, hx of Schizoaffective Disorder, presented to the ED on 01/20/20 at which time she presented pressured, hyper religious with disorganized thought process . She was found to be Covid (+) and was initially managed in the ED.  Today patient present alert , attentive, without psychomotor agitation. Today affect appears to be reactive ,not irritable at this  time. Denies feeling depressed or SI.  Thought process remains religiously preoccupied. Denies medication side effects. Labs reviewed- serum carbamazepine level within therapeutic at 7.3  Have reviewed medication side effects, including risk of sedation and of orthostatic hypotension. Currently denies dizziness or lightheadedness .  Treatment Plan Summary: - Continue inpatient hospitalization. - Will continue today 02/04/2020 plan as below except where it is noted.   Encourage group and milieu participation  Treatment Team working on disposition planning options- chart notes indicate patient's husband is now interim guardian  1.  Continue amlodipine 10 mg p.o. daily for hypertension. 2.  Continue Tegretol 300 mg p.o. twice daily for mood stability. 3.  Continue Haldol  15 mg IM twice daily if refuses oral Risperdal. 4.  Continue hydrochlorothiazide to 25 mg p.o. daily for hypertension ( BP today 139/89, pulse 94) . 5.  Continue agitation protocol as written. 6.  Continue  Risperdal  2 mg p.o. daily and 6 mg p.o.  nightly for psychosis and mood stability. 7.  Continue Trazodone to 150  mg p.o. nightly PRN for insomnia. 8.  Recheck EKG to monitor QTc ( 5/5 EKG QTc 456)   Sally Stammer, NP, PMHNP, FNP-BC. 02/04/2020, 1:28 PM  Patient ID: Sally Wang, female   DOB: 01-20-1961, 59 y.o.   MRN: [id number] Patient ID: Sally Wang, female   DOB: March 27, 1961, 59 y.o.   MRN: [id number]

## 2020-02-04 NOTE — Progress Notes (Signed)
   02/04/20 2120  COVID-19 Daily Checkoff  Have you had a fever (temp > 37.80C/100F)  in the past 24 hours?  No  If you have had runny nose, nasal congestion, sneezing in the past 24 hours, has it worsened? No  COVID-19 EXPOSURE  Have you traveled outside the state in the past 14 days? No  Have you been in contact with someone with a confirmed diagnosis of COVID-19 or PUI in the past 14 days without wearing appropriate PPE? No  Have you been living in the same home as a person with confirmed diagnosis of COVID-19 or a PUI (household contact)? No  Have you been diagnosed with COVID-19? No

## 2020-02-05 MED ORDER — HYDROCHLOROTHIAZIDE 25 MG PO TABS: 25.0000 mg | ORAL_TABLET | Freq: Every day | ORAL | 0 refills | Status: DC

## 2020-02-05 MED ORDER — RISPERIDONE 2 MG PO TBDP: 2.0000 mg | ORAL_TABLET | Freq: Every day | ORAL | 0 refills | Status: DC

## 2020-02-05 MED ORDER — AMLODIPINE BESYLATE 10 MG PO TABS: 10.0000 mg | ORAL_TABLET | Freq: Every day | ORAL | 0 refills | Status: DC

## 2020-02-05 MED ORDER — CARBAMAZEPINE 100 MG PO CHEW: 300.0000 mg | CHEWABLE_TABLET | Freq: Two times a day (BID) | ORAL | 0 refills | Status: DC

## 2020-02-05 MED ORDER — RISPERIDONE 3 MG PO TBDP: 6.0000 mg | ORAL_TABLET | Freq: Every day | ORAL | 0 refills | Status: DC

## 2020-02-05 NOTE — BHH Suicide Risk Assessment (Signed)
[facility] Discharge Suicide Risk Assessment   Principal Problem: Schizoaffective disorder [facility]) Discharge Diagnoses: Principal Problem:   Schizoaffective disorder (HCC)   Total Time spent with patient: 15 minutes  Musculoskeletal: Strength & Muscle Tone: within normal limits Gait & Station: normal Patient leans: N/A  Psychiatric Specialty Exam: Review of Systems  All other systems reviewed and are negative.   Blood pressure 126/77, pulse 82, temperature 97.7 F (36.5 C), temperature source Oral, resp. rate 20, SpO2 100 %.There is no height or weight on file to calculate BMI.  General Appearance: Casual  Eye Contact::  Fair  Speech:  Normal Rate409  Volume:  Normal  Mood:  Euthymic  Affect:  Congruent  Thought Process:  Coherent and Descriptions of Associations: Circumstantial  Orientation:  Full (Time, Place, and Person)  Thought Content:  Logical  Suicidal Thoughts:  No  Homicidal Thoughts:  No  Memory:  Immediate;   Fair Recent;   Fair Remote;   Fair  Judgement:  Intact  Insight:  Fair  Psychomotor Activity:  Increased  Concentration:  Fair  Recall:  [employer] of Knowledge:Fair  Language: Good  Akathisia:  Negative  Handed:  Right  AIMS (if indicated):     Assets:  Desire for Improvement Resilience  Sleep:  Number of Hours: 3.25  Cognition: WNL  ADL's:  Intact   Mental Status Per Nursing Assessment::   On Admission:  NA  Demographic Factors:  Low socioeconomic status  Loss Factors: Financial problems/change in socioeconomic status  Historical Factors: Impulsivity  Risk Reduction Factors:   Sense of responsibility to family, Living with another person, especially a relative and Positive social support  Continued Clinical Symptoms:  Bipolar Disorder:   Mixed State Schizophrenia:   Paranoid or undifferentiated type  Cognitive Features That Contribute To Risk:  None    Suicide Risk:  Minimal: No identifiable suicidal ideation.  Patients presenting  with no risk factors but with morbid ruminations; may be classified as minimal risk based on the severity of the depressive symptoms  Follow-up Information    [facility] Follow up on 02/09/2020.   Why: You have an appointment for medication management on 02/09/20 at 9:30 am.  This will be a virtual tele-health appointment.  Contact information: [address] [place] [address] [phone]        [facility] Follow up.   Contact information: [address] [place] [address] [phone]          Plan Of Care/Follow-up recommendations:  Activity:  ad lib  Antonieta Pert, MD 02/05/2020, 9:42 AM

## 2020-02-05 NOTE — Plan of Care (Signed)
Pt did not attend recreation therapy group sessions.   Marjette Lindsay, LRT/CTRS 

## 2020-02-05 NOTE — Progress Notes (Signed)
Recreation Therapy Notes  Date: 5.24.21 Time: 1000 Location: 500 Hall Dayroom  Group Topic: Coping Skills  Goal Area(s) Addresses:  Patient will identify positive coping skills. Patient will identify benefit of using coping skills post d/c.  Intervention: Worksheet, pencils  Activity: Coping A to Z.  Patients were to identify a positive coping skill for each letter of the alphabet.  Patients would then share their top 5 coping skills with the group.  Education: Coping Skills, Discharge Planning.   Education Outcome: Acknowledges understanding/In group clarification offered/Needs additional education.   Clinical Observations/Feedback:  Pt did not attend group session.      Lucylle Foulkes, LRT/CTRS         Jaegar Croft A 02/05/2020 11:50 AM 

## 2020-02-05 NOTE — Progress Notes (Signed)
  [facility] Adult Case Management Discharge Plan :  Will you be returning to the same living situation after discharge:  Yes,  home. At discharge, do you have transportation home?: Yes,  spouse and legal guardian will pick up at 1:00pm. Do you have the ability to pay for your medications: Yes,  Medicare.  Release of information consent forms completed and in the chart;  Patient's signature needed at discharge.  Patient to Follow up at: Follow-up Information    [facility] Follow up on 02/09/2020.   Why: You have an appointment for medication management on 02/09/20 at 9:30 am.  This will be a virtual tele-health appointment.  Contact information: [address] [place] [address] [phone]           Next level of care provider has access to [facility] Link:no  Safety Planning and Suicide Prevention discussed: Yes,  with daughter and spouse.  Has patient been referred to the Quitline?: N/A patient is not a smoker  Patient has been referred for addiction treatment: Yes  Darreld Mclean, LCSWA 02/05/2020, 9:54 AM

## 2020-02-05 NOTE — BHH Counselor (Signed)
CSW attempted to reach Dariela Stoker, spouse and interim legal guardian [phone] to coordinate discharge and transportation. CSW left a detailed voicemail with callback information.  Enid Cutter, MSW, LCSW-A Clinical Social Worker [facility] Adult Unit

## 2020-02-05 NOTE — BHH Counselor (Signed)
CSW spoke with Sally Wang, Sally Wang and interim legal [phone]. Sally Wang is agreeable to discharge and plans to pick up the patient around 1pm today. No questions or concerns. Sally Wang is aware of and agreeable to patient's follow up appointments.  Enid Cutter, MSW, LCSW-A Clinical Social Worker [facility] Adult Unit

## 2020-02-05 NOTE — Progress Notes (Signed)
Recreation Therapy Notes  INPATIENT RECREATION TR PLAN  Patient Details Name: CHESNEE FLOREN MRN: [id number] DOB: 10-Aug-1961 Today's Date: 02/05/2020  Rec Therapy Plan Is patient appropriate for Therapeutic Recreation?: Yes Treatment times per week: about 3 days Estimated Length of Stay: 5-7 days TR Treatment/Interventions: Group participation (Comment)  Discharge Criteria Pt will be discharged from therapy if:: Discharged Treatment plan/goals/alternatives discussed and agreed upon by:: Patient/family  Discharge Summary Short term goals set: See patient care plan Short term goals met: Not met Reason goals not met: Pt did not attend group sessions. Therapeutic equipment acquired: N/A Reason patient discharged from therapy: Discharge from hospital Pt/family agrees with progress & goals achieved: Yes Date patient discharged from therapy: 02/05/20    Victorino Sparrow, LRT/CTRS   Ria Comment, Braxdon Gappa A 02/05/2020, 11:54 AM

## 2020-02-05 NOTE — Discharge Summary (Signed)
Physician Discharge Summary Note  Patient:  Sally Wang is an 59 y.o., female MRN:  [id number] DOB:  12/08/1960 Patient phone:  [phone] (home)  Patient address:   [address] [place] [address],  Total Time spent with patient: 15 minutes  Date of Admission:  01/26/2020 Date of Discharge: 02/05/2020  Reason for Admission:  Acute mania  Principal Problem: Schizoaffective disorder [name]) Discharge Diagnoses: Principal Problem:   Schizoaffective disorder [name])   Past Psychiatric History: Mental health history is limited due to patient's mental health status as well as a unknown mental health history information from family.  This appears the patient has had multiple hospitalizations over the years with a long history of mental health issues  Past Medical History:  Past Medical History:  Diagnosis Date  . Hypertension   . Schizoaffective disorder [facility])     Past Surgical History:  Procedure Laterality Date  . CHOLECYSTECTOMY  2006   Family History:  Family History  Problem Relation Age of Onset  . Coronary artery disease Father        died 40 yo  . Diabetes Mother        died 92  . Hypertension Brother    Family Psychiatric  History: Unknown Social History:  Social History   Substance and Sexual Activity  Alcohol Use No     Social History   Substance and Sexual Activity  Drug Use No    Social History   Socioeconomic History  . Marital status: Married    Spouse name: [name]  . Number of children: Not on file  . Years of education: Not on file  . Highest education level: Not on file  Occupational History  . Occupation: homemaker  Tobacco Use  . Smoking status: Never Smoker  . Smokeless tobacco: Never Used  Substance and Sexual Activity  . Alcohol use: No  . Drug use: No  . Sexual activity: Not Currently  Other Topics Concern  . Not on file  Social History Narrative   Husband - [name] [address] -1987   [name] -2010  granddaughte      Pets -  Pit bulls #4            Social Determinants of Health   Financial Resource Strain:   . Difficulty of Paying Living Expenses:   Food Insecurity:   . Worried About Programme researcher, broadcasting/film/video in the Last Year:   . Barista in the Last Year:   Transportation Needs:   . Freight forwarder (Medical):   Marland Kitchen Lack of Transportation (Non-Medical):   Physical Activity:   . Days of Exercise per Week:   . Minutes of Exercise per Session:   Stress:   . Feeling of Stress :   Social Connections:   . Frequency of Communication with Friends and Family:   . Frequency of Social Gatherings with Friends and Family:   . Attends Religious Services:   . Active Member of Clubs or Organizations:   . Attends Banker Meetings:   Marland Kitchen Marital Status:     Hospital Course:  From admission H&P: Patient is a 59 year old female with a reported past psychiatric history significant for schizoaffective disorder who originally presented to the [name] emergency department on 01/20/2020. She was noted at that time to be pressured, hyper religious and tangential. She was started on Zyprexa. Unfortunately patient was COVID-19 positive. She was followed in the emergency department  until 5/14. She was transferred to our facility. She apparently was noncompliant with her medicines while in the emergency room and only received them intermittently. Her current psychiatric medications at this time include as needed's of Cogentin, Haldol. Her Haldol dosage is standing at 5 mg p.o. or IM twice daily. Today on examination she remains delusional. She stated that she has gone to medical school, done a residency in psychiatry, and doesn't understand why we ask her these questions with her knowledge base. The last psychiatric consultation note that I can find in the chart is from 01/25/2020. It looks like she had received Seroquel.   [name] was admitted for manic  and psychotic symptoms. She remained on the [name] unit for ten days. She was started on Tegretol and Risperdal. She participated in group therapy on the unit. She has shown improved mood, affect, sleep, and interaction. She is demonstrating more organized speech and behavior. She denies any SI/HI/AVH and contracts for safety. She is discharging on the medications listed below. She agrees to follow up at [name] (see below). Patient is provided with prescriptions for medications upon discharge. Her spouse is picking her up for discharge home.  Physical Findings: AIMS: Facial and Oral Movements Muscles of Facial Expression: None, normal Lips and Perioral Area: None, normal Jaw: None, normal Tongue: None, normal,Extremity Movements Upper (arms, wrists, hands, fingers): None, normal Lower (legs, knees, ankles, toes): None, normal, Trunk Movements Neck, shoulders, hips: None, normal, Overall Severity Severity of abnormal movements (highest score from questions above): None, normal Incapacitation due to abnormal movements: None, normal Patient's awareness of abnormal movements (rate only patient's report): No Awareness, Dental Status Current problems with teeth and/or dentures?: No Does patient usually wear dentures?: No  CIWA:    COWS:     Musculoskeletal: Strength & Muscle Tone: within normal limits Gait & Station: normal Patient leans: N/A  Psychiatric Specialty Exam: Physical Exam  Nursing note and vitals reviewed. Constitutional: She is oriented to person, place, and time. She appears well-developed and well-nourished.  Cardiovascular: Normal rate.  Respiratory: Effort normal.  Neurological: She is alert and oriented to person, place, and time.    Review of Systems  Constitutional: Negative.   Psychiatric/Behavioral: Negative for agitation, behavioral problems, confusion, dysphoric mood, hallucinations, self-injury, sleep disturbance and suicidal ideas. The patient is not  nervous/anxious and is not hyperactive.     Blood pressure 126/77, pulse 82, temperature 97.7 F (36.5 C), temperature source Oral, resp. rate 20, SpO2 100 %.There is no height or weight on file to calculate BMI.  See MD's discharge SRA      Has this patient used any form of tobacco in the last 30 days? (Cigarettes, Smokeless Tobacco, Cigars, and/or Pipes)  No  Blood Alcohol level:  Lab Results  Component Value Date   ETH <5 54/62/7035    Metabolic Disorder Labs:  Lab Results  Component Value Date   HGBA1C 6.4 (H) 01/27/2020   MPG 136.98 01/27/2020   No results found for: PROLACTIN Lab Results  Component Value Date   CHOL 163 01/27/2020   TRIG 46 01/27/2020   HDL 47 01/27/2020   CHOLHDL 3.5 01/27/2020   VLDL 9 01/27/2020   LDLCALC 107 (H) 01/27/2020   LDLCALC 138 (H) 11/17/2007    See Psychiatric Specialty Exam and Suicide Risk Assessment completed by Attending Physician prior to discharge.  Discharge destination:  Home  Is patient on multiple antipsychotic therapies at discharge:  No   Has Patient had three or more  failed trials of antipsychotic monotherapy by history:  No  Recommended Plan for Multiple Antipsychotic Therapies: NA  Discharge Instructions    Discharge instructions   Complete by: As directed    Patient is instructed to take all prescribed medications as recommended. Report any side effects or adverse reactions to your outpatient psychiatrist. Patient is instructed to abstain from alcohol and illegal drugs while on prescription medications. In the event of worsening symptoms, patient is instructed to call the crisis hotline, 911, or go to the nearest emergency department for evaluation and treatment.     Allergies as of 02/05/2020   No Known Allergies     Medication List    TAKE these medications     Indication  amLODipine 10 MG tablet Commonly known as: NORVASC Take 1 tablet (10 mg total) by mouth daily. What changed:   medication  strength  how much to take  Indication: High Blood Pressure Disorder   carbamazepine 100 MG chewable tablet Commonly known as: TEGRETOL Chew 3 tablets (300 mg total) by mouth 2 (two) times daily.  Indication: Manic-Depression   hydrochlorothiazide 25 MG tablet Commonly known as: HYDRODIURIL Take 1 tablet (25 mg total) by mouth daily.  Indication: High Blood Pressure Disorder   risperidone 3 MG disintegrating tablet Commonly known as: RISPERDAL M-TABS Take 2 tablets (6 mg total) by mouth at bedtime.  Indication: MIXED BIPOLAR AFFECTIVE DISORDER   risperiDONE 2 MG disintegrating tablet Commonly known as: RISPERDAL M-TABS Take 1 tablet (2 mg total) by mouth daily.  Indication: MIXED BIPOLAR AFFECTIVE DISORDER      Follow-up Information    [facility] Follow up on 02/09/2020.   Why: You have an appointment for medication management on 02/09/20 at 9:30 am.  This will be a virtual tele-health appointment.  Contact information: [address] [place] [address] [phone]           Follow-up recommendations: Activity as tolerated. Diet as recommended by primary care physician. Keep all scheduled follow-up appointments as recommended.   Comments:   Patient is instructed to take all prescribed medications as recommended. Report any side effects or adverse reactions to your outpatient psychiatrist. Patient is instructed to abstain from alcohol and illegal drugs while on prescription medications. In the event of worsening symptoms, patient is instructed to call the crisis hotline, 911, or go to the nearest emergency department for evaluation and treatment.  Signed: Aldean Baker, NP 02/06/2020, 2:41 PM

## 2020-02-05 NOTE — Progress Notes (Signed)
Pt discharged to lobby. Pt was stable and appreciative at that time. All papers and prescriptions were given and valuables returned. Verbal understanding expressed. Denies SI/HI and A/VH. Pt given opportunity to express concerns and ask questions.  

## 2020-02-27 ENCOUNTER — Inpatient Hospital Stay (HOSPITAL_COMMUNITY)
Admission: EM | Admit: 2020-02-27 | Discharge: 2020-03-04 | DRG: 354 | Disposition: A | Discharge status: Home / Self Care | Payer: Medicare HMO | Attending: Internal Medicine | Admitting: Internal Medicine

## 2020-02-27 ENCOUNTER — Encounter (HOSPITAL_COMMUNITY): Payer: Self-pay | Admitting: Emergency Medicine

## 2020-02-27 ENCOUNTER — Other Ambulatory Visit: Payer: Self-pay

## 2020-02-27 ENCOUNTER — Observation Stay (HOSPITAL_COMMUNITY): Payer: Medicare HMO

## 2020-02-27 DIAGNOSIS — Z8616 Personal history of COVID-19: Secondary | ICD-10-CM

## 2020-02-27 DIAGNOSIS — Z8249 Family history of ischemic heart disease and other diseases of the circulatory system: Secondary | ICD-10-CM

## 2020-02-27 DIAGNOSIS — R7401 Elevation of levels of liver transaminase levels: Secondary | ICD-10-CM | POA: Diagnosis present

## 2020-02-27 DIAGNOSIS — E878 Other disorders of electrolyte and fluid balance, not elsewhere classified: Secondary | ICD-10-CM | POA: Diagnosis present

## 2020-02-27 DIAGNOSIS — E876 Hypokalemia: Secondary | ICD-10-CM | POA: Diagnosis present

## 2020-02-27 DIAGNOSIS — Z79899 Other long term (current) drug therapy: Secondary | ICD-10-CM | POA: Diagnosis not present

## 2020-02-27 DIAGNOSIS — Z781 Physical restraint status: Secondary | ICD-10-CM | POA: Diagnosis not present

## 2020-02-27 DIAGNOSIS — E669 Obesity, unspecified: Secondary | ICD-10-CM | POA: Diagnosis present

## 2020-02-27 DIAGNOSIS — E860 Dehydration: Secondary | ICD-10-CM | POA: Diagnosis present

## 2020-02-27 DIAGNOSIS — Z03818 Encounter for observation for suspected exposure to other biological agents ruled out: Secondary | ICD-10-CM | POA: Diagnosis not present

## 2020-02-27 DIAGNOSIS — K450 Other specified abdominal hernia with obstruction, without gangrene: Secondary | ICD-10-CM | POA: Diagnosis not present

## 2020-02-27 DIAGNOSIS — Z6836 Body mass index (BMI) 36.0-36.9, adult: Secondary | ICD-10-CM

## 2020-02-27 DIAGNOSIS — N1831 Chronic kidney disease, stage 3a: Secondary | ICD-10-CM | POA: Diagnosis present

## 2020-02-27 DIAGNOSIS — K460 Unspecified abdominal hernia with obstruction, without gangrene: Secondary | ICD-10-CM | POA: Diagnosis not present

## 2020-02-27 DIAGNOSIS — F259 Schizoaffective disorder, unspecified: Secondary | ICD-10-CM | POA: Diagnosis not present

## 2020-02-27 DIAGNOSIS — K56600 Partial intestinal obstruction, unspecified as to cause: Secondary | ICD-10-CM | POA: Diagnosis present

## 2020-02-27 DIAGNOSIS — K420 Umbilical hernia with obstruction, without gangrene: Secondary | ICD-10-CM | POA: Diagnosis present

## 2020-02-27 DIAGNOSIS — K439 Ventral hernia without obstruction or gangrene: Secondary | ICD-10-CM | POA: Diagnosis not present

## 2020-02-27 DIAGNOSIS — K5790 Diverticulosis of intestine, part unspecified, without perforation or abscess without bleeding: Secondary | ICD-10-CM | POA: Diagnosis not present

## 2020-02-27 DIAGNOSIS — Z9114 Patient's other noncompliance with medication regimen: Secondary | ICD-10-CM

## 2020-02-27 DIAGNOSIS — I10 Essential (primary) hypertension: Secondary | ICD-10-CM | POA: Diagnosis not present

## 2020-02-27 DIAGNOSIS — F309 Manic episode, unspecified: Secondary | ICD-10-CM | POA: Diagnosis not present

## 2020-02-27 DIAGNOSIS — K429 Umbilical hernia without obstruction or gangrene: Secondary | ICD-10-CM | POA: Diagnosis not present

## 2020-02-27 DIAGNOSIS — R112 Nausea with vomiting, unspecified: Secondary | ICD-10-CM | POA: Diagnosis not present

## 2020-02-27 DIAGNOSIS — R000 Tachycardia, unspecified: Secondary | ICD-10-CM | POA: Diagnosis not present

## 2020-02-27 DIAGNOSIS — F250 Schizoaffective disorder, bipolar type: Secondary | ICD-10-CM | POA: Diagnosis present

## 2020-02-27 DIAGNOSIS — Z833 Family history of diabetes mellitus: Secondary | ICD-10-CM | POA: Diagnosis not present

## 2020-02-27 DIAGNOSIS — N179 Acute kidney failure, unspecified: Secondary | ICD-10-CM | POA: Diagnosis present

## 2020-02-27 DIAGNOSIS — I129 Hypertensive chronic kidney disease with stage 1 through stage 4 chronic kidney disease, or unspecified chronic kidney disease: Secondary | ICD-10-CM | POA: Diagnosis present

## 2020-02-27 DIAGNOSIS — E871 Hypo-osmolality and hyponatremia: Secondary | ICD-10-CM | POA: Diagnosis present

## 2020-02-27 LAB — COMPREHENSIVE METABOLIC PANEL
ALT: 44 U/L (ref 0–44)
AST: 34 U/L (ref 15–41)
Albumin: 3.9 g/dL (ref 3.5–5.0)
Alkaline Phosphatase: 87 U/L (ref 38–126)
Anion gap: 17 — ABNORMAL HIGH (ref 5–15)
BUN: 45 mg/dL — ABNORMAL HIGH (ref 6–20)
CO2: 30 mmol/L (ref 22–32)
Calcium: 8.6 mg/dL — ABNORMAL LOW (ref 8.9–10.3)
Chloride: 69 mmol/L — ABNORMAL LOW (ref 98–111)
Creatinine, Ser: 1.22 mg/dL — ABNORMAL HIGH (ref 0.44–1.00)
GFR calc Af Amer: 57 mL/min — ABNORMAL LOW (ref >60)
GFR calc non Af Amer: 49 mL/min — ABNORMAL LOW (ref >60)
Glucose, Bld: 115 mg/dL — ABNORMAL HIGH (ref 70–99)
Potassium: 3 mmol/L — ABNORMAL LOW (ref 3.5–5.1)
Sodium: 116 mmol/L — CL (ref 135–145)
Total Bilirubin: 0.8 mg/dL (ref 0.3–1.2)
Total Protein: 7.9 g/dL (ref 6.5–8.1)

## 2020-02-27 LAB — CBC WITH DIFFERENTIAL/PLATELET
Abs Immature Granulocytes: 0.07 10*3/uL (ref 0.00–0.07)
Basophils Absolute: 0 10*3/uL (ref 0.0–0.1)
Basophils Relative: 0 %
Eosinophils Absolute: 0 10*3/uL (ref 0.0–0.5)
Eosinophils Relative: 0 %
HCT: 43.9 % (ref 36.0–46.0)
Hemoglobin: 15.4 g/dL — ABNORMAL HIGH (ref 12.0–15.0)
Immature Granulocytes: 1 %
Lymphocytes Relative: 21 %
Lymphs Abs: 2 10*3/uL (ref 0.7–4.0)
MCH: 28.2 pg (ref 26.0–34.0)
MCHC: 35.1 g/dL (ref 30.0–36.0)
MCV: 80.3 fL (ref 80.0–100.0)
Monocytes Absolute: 0.6 10*3/uL (ref 0.1–1.0)
Monocytes Relative: 6 %
Neutro Abs: 7 10*3/uL (ref 1.7–7.7)
Neutrophils Relative %: 72 %
Platelets: 385 10*3/uL (ref 150–400)
RBC: 5.47 MIL/uL — ABNORMAL HIGH (ref 3.87–5.11)
RDW: 15.7 % — ABNORMAL HIGH (ref 11.5–15.5)
WBC: 9.7 10*3/uL (ref 4.0–10.5)
nRBC: 0 % (ref 0.0–0.2)

## 2020-02-27 LAB — OSMOLALITY, URINE: Osmolality, Ur: 451 mOsm/kg (ref 300–900)

## 2020-02-27 LAB — LIPASE, BLOOD: Lipase: 66 U/L — ABNORMAL HIGH (ref 11–51)

## 2020-02-27 LAB — RAPID URINE DRUG SCREEN, HOSP PERFORMED
Amphetamines: NOT DETECTED
Barbiturates: NOT DETECTED
Benzodiazepines: NOT DETECTED
Cocaine: NOT DETECTED
Opiates: NOT DETECTED
Tetrahydrocannabinol: NOT DETECTED

## 2020-02-27 LAB — I-STAT BETA HCG BLOOD, ED (MC, WL, AP ONLY): I-stat hCG, quantitative: <5 m[IU]/mL (ref <5)

## 2020-02-27 LAB — NA AND K (SODIUM & POTASSIUM), RAND UR
Potassium Urine: 38 mmol/L
Sodium, Ur: <10 mmol/L

## 2020-02-27 LAB — SARS CORONAVIRUS 2 BY RT PCR (HOSPITAL ORDER, PERFORMED IN ~~LOC~~ HOSPITAL LAB): SARS Coronavirus 2: NEGATIVE

## 2020-02-27 LAB — ETHANOL: Alcohol, Ethyl (B): <10 mg/dL (ref <10)

## 2020-02-27 MED ORDER — HYDRALAZINE HCL 20 MG/ML IJ SOLN
10.0000 mg | INTRAMUSCULAR | Status: DC | PRN
  Administered 2020-02-28 – 2020-03-01 (×3): 10 mg via INTRAVENOUS
  Filled 2020-02-27 (×3): qty 1

## 2020-02-27 MED ORDER — ZIPRASIDONE MESYLATE 20 MG IM SOLR: 20.0000 mg | Freq: Once | INTRAMUSCULAR | Status: DC

## 2020-02-27 MED ORDER — POTASSIUM CHLORIDE IN NACL 20-0.9 MEQ/L-% IV SOLN
INTRAVENOUS | Status: DC
  Filled 2020-02-27: qty 1000

## 2020-02-27 MED ORDER — ONDANSETRON HCL 4 MG PO TABS: 4.0000 mg | ORAL_TABLET | Freq: Four times a day (QID) | ORAL | Status: DC | PRN

## 2020-02-27 MED ORDER — ONDANSETRON HCL 4 MG/2ML IJ SOLN: 4.0000 mg | Freq: Four times a day (QID) | INTRAMUSCULAR | Status: DC | PRN

## 2020-02-27 NOTE — ED Notes (Signed)
Date and time results received: 02/27/20 8:23 PM   (use smartphrase ".now" to insert current time)  Test: Na Critical Value: 116  Name of Provider Notified: [place]  Orders Received? Or Actions Taken?: Orders Received - See Orders for details

## 2020-02-27 NOTE — ED Notes (Signed)
Attempted to obtain 12-lead EKG and patient states multiple times that she did not need one and her heart was fine. Patient makes statement to this writer that she wishes we would leave her room. Patient did allow EMT Joy to obtain blood work without issue. Vitals also obtained without issue.

## 2020-02-27 NOTE — ED Notes (Signed)
Pt given purple scrubs

## 2020-02-27 NOTE — H&P (Addendum)
History and Physical    Sally Wang LPF:[id number] DOB: 1961-08-27 DOA: 02/27/2020  PCP: Patient, No Pcp Per  Patient coming from: Home.  Most of the history is obtained from patient's husband as patient has schizoaffective disorder with possible delusions/psychosis.  Chief Complaint: Nausea vomiting.  HPI: Sally Wang is a 59 y.o. female with history of hypertension and schizoaffective disorder admitted and discharged from behavioral health on 02/05/2020 and as per the husband since the discharge has not been taking any of her medications was brought to the ER the patient has been having persistent nausea vomiting for the last 1 week.  Not sure when was her last bowel movement.  Patient has not had any chest pain shortness of breath or any fever chills.  At times the vomitus is blood-tinged.  During last admission patient was Covid positive.  ED Course: In the ER patient appears confused and at times talking hyperreligious.  Patient is not allowing me to do complete physical exam.  Labs are significant for sodium 116 potassium 3 creatinine 1.2 lipase 66 with hemoglobin of 15.4 Covid test was negative.  Drug screen is negative EKG shows sinus tachycardia.  Review of Systems: As per HPI, rest all negative.   Past Medical History:  Diagnosis Date  . Hypertension   . Schizoaffective disorder [facility])     Past Surgical History:  Procedure Laterality Date  . CHOLECYSTECTOMY  2006     reports that she has never smoked. She has never used smokeless tobacco. She reports that she does not drink alcohol and does not use drugs.  No Known Allergies  Family History  Problem Relation Age of Onset  . Coronary artery disease Father        died 26 yo  . Diabetes Mother        died 8  . Hypertension Brother     Prior to Admission medications   Medication Sig Start Date End Date Taking? Authorizing Provider  amLODipine (NORVASC) 10 MG tablet Take 1 tablet (10 mg total) by mouth daily.  02/06/20   Connye Burkitt, NP  carbamazepine (TEGRETOL) 100 MG chewable tablet Chew 3 tablets (300 mg total) by mouth 2 (two) times daily. 02/05/20   Connye Burkitt, NP  hydrochlorothiazide (HYDRODIURIL) 25 MG tablet Take 1 tablet (25 mg total) by mouth daily. 02/06/20   Connye Burkitt, NP  risperiDONE (RISPERDAL M-TABS) 2 MG disintegrating tablet Take 1 tablet (2 mg total) by mouth daily. 02/06/20   Connye Burkitt, NP  risperiDONE (RISPERDAL M-TABS) 3 MG disintegrating tablet Take 2 tablets (6 mg total) by mouth at bedtime. 02/05/20   Connye Burkitt, NP    Physical Exam: Constitutional: Moderately built and nourished. Vitals:   02/27/20 1917 02/27/20 2053 02/27/20 2228 02/27/20 2234  BP: (!) 147/104  135/82 135/82  Pulse: (!) 118  (!) 101 (!) 109  Resp: 16  18 17   Temp:  97.8 F (36.6 C)    TempSrc:  Oral    SpO2: 97%  97% 97%   Eyes: Anicteric no pallor. ENMT: No discharge from the ears eyes nose or mouth. Neck: Patient is not allowing exam. Respiratory: Patient is not allowing exam. Cardiovascular: Patient is not allowing exam. Abdomen: Patient is not allowing for the exam. Musculoskeletal: No edema. Skin: No obvious rash. Neurologic: Alert awake oriented to name moving all extremities. Psychiatric: Appears delusional.   Labs on Admission: I have personally reviewed following labs and imaging studies  CBC:  Recent Labs  Lab 02/27/20 1924  WBC 9.7  NEUTROABS 7.0  HGB 15.4*  HCT 43.9  MCV 80.3  PLT 385   Basic Metabolic Panel: Recent Labs  Lab 02/27/20 1924  NA 116*  K 3.0*  CL 69*  CO2 30  GLUCOSE 115*  BUN 45*  CREATININE 1.22*  CALCIUM 8.6*   GFR: CrCl cannot be calculated (Unknown ideal weight.). Liver Function Tests: Recent Labs  Lab 02/27/20 1924  AST 34  ALT 44  ALKPHOS 87  BILITOT 0.8  PROT 7.9  ALBUMIN 3.9   Recent Labs  Lab 02/27/20 1924  LIPASE 66*   No results for input(s): AMMONIA in the last 168 hours. Coagulation Profile: No  results for input(s): INR, PROTIME in the last 168 hours. Cardiac Enzymes: No results for input(s): CKTOTAL, CKMB, CKMBINDEX, TROPONINI in the last 168 hours. BNP (last 3 results) No results for input(s): PROBNP in the last 8760 hours. HbA1C: No results for input(s): HGBA1C in the last 72 hours. CBG: No results for input(s): GLUCAP in the last 168 hours. Lipid Profile: No results for input(s): CHOL, HDL, LDLCALC, TRIG, CHOLHDL, LDLDIRECT in the last 72 hours. Thyroid Function Tests: No results for input(s): TSH, T4TOTAL, FREET4, T3FREE, THYROIDAB in the last 72 hours. Anemia Panel: No results for input(s): VITAMINB12, FOLATE, FERRITIN, TIBC, IRON, RETICCTPCT in the last 72 hours. Urine analysis: No results found for: COLORURINE, APPEARANCEUR, LABSPEC, PHURINE, GLUCOSEU, HGBUR, BILIRUBINUR, KETONESUR, PROTEINUR, UROBILINOGEN, NITRITE, LEUKOCYTESUR Sepsis Labs: @LABRCNTIP (procalcitonin:4,lacticidven:4) ) Recent Results (from the past 240 hour(s))  SARS Coronavirus 2 by RT PCR (hospital order, performed in [facility] hospital lab) Nasopharyngeal Nasopharyngeal Swab     Status: None   Collection Time: 02/27/20  7:24 PM   Specimen: Nasopharyngeal Swab  Result Value Ref Range Status   SARS Coronavirus 2 NEGATIVE NEGATIVE Final    Comment: (NOTE) SARS-CoV-2 target nucleic acids are NOT DETECTED.  The SARS-CoV-2 RNA is generally detectable in upper and lower respiratory specimens during the acute phase of infection. The lowest concentration of SARS-CoV-2 viral copies this assay can detect is 250 copies / mL. A negative result does not preclude SARS-CoV-2 infection and should not be used as the sole basis for treatment or other patient management decisions.  A negative result may occur with improper specimen collection / handling, submission of specimen other than nasopharyngeal swab, presence of viral mutation(s) within the areas targeted by this assay, and inadequate number of viral  copies (<250 copies / mL). A negative result must be combined with clinical observations, patient history, and epidemiological information.  Fact Sheet for Patients:   02/29/20  Fact Sheet for Healthcare Providers: BoilerBrush.com.cy  This test is not yet approved or  cleared by the https://pope.com/ FDA and has been authorized for detection and/or diagnosis of SARS-CoV-2 by FDA under an Emergency Use Authorization (EUA).  This EUA will remain in effect (meaning this test can be used) for the duration of the COVID-19 declaration under Section 564(b)(1) of the Act, 21 U.S.C. section 360bbb-3(b)(1), unless the authorization is terminated or revoked sooner.  Performed at [facility], [address], [place], [place]      Radiological Exams on Admission: No results found.  EKG: Independently reviewed.  Normal sinus rhythm.  Assessment/Plan Principal Problem:   Hyponatremia Active Problems:   Schizoaffective disorder (HCC)   ARF (acute renal failure) (HCC)   Hypokalemia    1. Severe hyponatremia with hypokalemia and acute renal failure -suspect patient's hyponatremia  could be from dehydration that patient has been having vomiting.  Will check urine studies including urine osmolality urine sodium check serum osmolality TSH and cortisol levels and will closely monitor basic metabolic panel.  We will also check serum uric acid levels.  For now I am gently hydrating thinking that patient could be probably dehydrated given the acute renal failure with nausea vomiting. 2. Nausea vomiting cause not clear.  Since patient is not allowing a proper physical exam I was not able to examine patient's abdomen so I am getting a CT abdomen pelvis to make sure there is no intra-abdominal cause for the vomiting. 3. Hypertension does not take any medication as per the patient has been medically patient.  IV  hydralazine. 4. History of schizoaffective disorder has not been taking any of her medications since last discharge on 02/05/2020.  Will need psychiatry input.  Since patient has severe hyponatremia with nausea vomiting will need inpatient status.  Addendum -CT abdomen pelvis done shows proximal partial small bowel obstruction with possible incarcerated bowel around umbilical hernia for which we will be consulting general surgery.   DVT prophylaxis: SCDs for now and will get CT results. Code Status: Full code. Family Communication: Patient has been. Disposition Plan: To be determined. Consults called: We will consult general surgery. Admission status: Inpatient.   Eduard Clos MD [facility] Pager [phone].  If 7PM-7AM, please contact night-coverage [email or web address] Password [facility]  02/27/2020, 10:38 PM

## 2020-02-27 NOTE — ED Notes (Signed)
Pt dressed in burgundy scrubs but refusing to wear pants.

## 2020-02-27 NOTE — ED Notes (Signed)
Patient refused vitals.

## 2020-02-27 NOTE — ED Triage Notes (Signed)
Patient here from home reporting abd pain, n/v x1 week. Patient refusing vitals in triage. Rambling speech, hyper-religious. Husband states that patient needs a mental health evaluation.

## 2020-02-27 NOTE — ED Notes (Signed)
Patient refusing vitals.  Pleasant otherwise.

## 2020-02-27 NOTE — ED Provider Notes (Signed)
[facility]-EMERGENCY DEPT Provider Note   CSN: [id number] Arrival date & time: 02/27/20  1740     History Chief Complaint  Patient presents with  . Emesis  . Psychiatric Evaluation    Sally Wang is a 59 y.o. female.  Level 5 caveat.  Difficult to obtain history due to manic behavior.  History obtained by family.  The history is provided by the patient.  Mental Health Problem Presenting symptoms: disorganized speech and disorganized thought process   Degree of incapacity (severity):  Mild Onset quality:  Gradual Timing:  Constant Progression:  Worsening Risk factors: hx of mental illness        Past Medical History:  Diagnosis Date  . Hypertension   . Schizoaffective disorder [facility])     Patient Active Problem List   Diagnosis Date Noted  . Hyponatremia 02/27/2020  . Schizoaffective disorder (HCC)   . OBESITY 01/03/2009  . Uncontrolled hypertension 06/17/2007    Past Surgical History:  Procedure Laterality Date  . CHOLECYSTECTOMY  2006     OB History   No obstetric history on file.     Family History  Problem Relation Age of Onset  . Coronary artery disease Father        died 58 yo  . Diabetes Mother        died 65  . Hypertension Brother     Social History   Tobacco Use  . Smoking status: Never Smoker  . Smokeless tobacco: Never Used  Vaping Use  . Vaping Use: Never used  Substance Use Topics  . Alcohol use: No  . Drug use: No    Home Medications Prior to Admission medications   Medication Sig Start Date End Date Taking? Authorizing Provider  amLODipine (NORVASC) 10 MG tablet Take 1 tablet (10 mg total) by mouth daily. 02/06/20   Aldean Baker, NP  carbamazepine (TEGRETOL) 100 MG chewable tablet Chew 3 tablets (300 mg total) by mouth 2 (two) times daily. 02/05/20   Aldean Baker, NP  hydrochlorothiazide (HYDRODIURIL) 25 MG tablet Take 1 tablet (25 mg total) by mouth daily. 02/06/20   Aldean Baker, NP  risperiDONE  (RISPERDAL M-TABS) 2 MG disintegrating tablet Take 1 tablet (2 mg total) by mouth daily. 02/06/20   Aldean Baker, NP  risperiDONE (RISPERDAL M-TABS) 3 MG disintegrating tablet Take 2 tablets (6 mg total) by mouth at bedtime. 02/05/20   Aldean Baker, NP    Allergies    Patient has no known allergies.  Review of Systems   Review of Systems  Unable to perform ROS: Psychiatric disorder    Physical Exam Updated Vital Signs BP (!) 147/104 (BP Location: Right Arm)   Pulse (!) 118   Temp 97.8 F (36.6 C) (Oral)   Resp 16   SpO2 97%   Physical Exam Vitals and nursing note reviewed.  Constitutional:      General: She is not in acute distress.    Appearance: She is well-developed. She is not ill-appearing.  HENT:     Head: Normocephalic and atraumatic.     Mouth/Throat:     Mouth: Mucous membranes are moist.  Eyes:     Extraocular Movements: Extraocular movements intact.     Conjunctiva/sclera: Conjunctivae normal.     Pupils: Pupils are equal, round, and reactive to light.  Cardiovascular:     Rate and Rhythm: Normal rate and regular rhythm.     Pulses: Normal pulses.     Heart  sounds: Normal heart sounds. No murmur heard.   Pulmonary:     Effort: Pulmonary effort is normal. No respiratory distress.     Breath sounds: Normal breath sounds.  Abdominal:     Palpations: Abdomen is soft.     Tenderness: There is no abdominal tenderness.  Musculoskeletal:     Cervical back: Normal range of motion and neck supple.  Skin:    General: Skin is warm and dry.  Neurological:     General: No focal deficit present.     Mental Status: She is alert.  Psychiatric:        Mood and Affect: Affect is labile.        Behavior: Behavior is hyperactive.     ED Results / Procedures / Treatments   Labs (all labs ordered are listed, but only abnormal results are displayed) Labs Reviewed  COMPREHENSIVE METABOLIC PANEL - Abnormal; Notable for the following components:      Result Value    Sodium 116 (*)    Potassium 3.0 (*)    Chloride 69 (*)    Glucose, Bld 115 (*)    BUN 45 (*)    Creatinine, Ser 1.22 (*)    Calcium 8.6 (*)    GFR calc non Af Amer 49 (*)    GFR calc Af Amer 57 (*)    Anion gap 17 (*)    All other components within normal limits  CBC WITH DIFFERENTIAL/PLATELET - Abnormal; Notable for the following components:   RBC 5.47 (*)    Hemoglobin 15.4 (*)    RDW 15.7 (*)    All other components within normal limits  LIPASE, BLOOD - Abnormal; Notable for the following components:   Lipase 66 (*)    All other components within normal limits  SARS CORONAVIRUS 2 BY RT PCR (HOSPITAL ORDER, [place] [facility])  ETHANOL  RAPID URINE DRUG SCREEN, HOSP PERFORMED  NA AND K (SODIUM & POTASSIUM), RAND UR  OSMOLALITY, URINE  OSMOLALITY  I-STAT BETA HCG BLOOD, ED ([facility], [facility], [facility] ONLY)    EKG EKG Interpretation  Date/Time:  Tuesday February 27 2020 20:54:12 EDT Ventricular Rate:  110 PR Interval:    QRS Duration: 90 QT Interval:  378 QTC Calculation: 512 R Axis:   -9 Text Interpretation: Sinus tachycardia Right atrial enlargement Left ventricular hypertrophy Prolonged QT interval Baseline wander in lead(s) V2 V3 Confirmed by Lennice Sites [phone]) on 02/27/2020 9:19:13 PM   Radiology No results found.  Procedures .Critical Care Performed by: Lennice Sites, DO Authorized by: Lennice Sites, DO   Critical care provider statement:    Critical care time (minutes):  35   Critical care was necessary to treat or prevent imminent or life-threatening deterioration of the following conditions:  Metabolic crisis   Critical care was time spent personally by me on the following activities:  Blood draw for specimens, development of treatment plan with patient or surrogate, discussions with primary provider, evaluation of patient's response to treatment, examination of patient, obtaining history from patient or surrogate, ordering and performing treatments  and interventions, ordering and review of laboratory studies, ordering and review of radiographic studies, pulse oximetry, re-evaluation of patient's condition and review of old charts   I assumed direction of critical care for this patient from another provider in my specialty: no     (including critical care time)  Medications Ordered in ED Medications  ziprasidone (GEODON) injection 20 mg (0 mg Intramuscular Hold 02/27/20 1935)  ED Course  I have reviewed the triage vital signs and the nursing notes.  Pertinent labs & imaging results that were available during my care of the patient were reviewed by me and considered in my medical decision making (see chart for details).    MDM Rules/Calculators/A&P                          Sally Wang is a 59 year old female with history of hypertension, schizoaffective who presents the ED with emesis, mania.  Patient exhibiting psychotic type behavior.  She appears manic.  Random and hyper religious speech.  Patient has not been taking any of her psych medications since being discharged from psychiatric facility a month ago.  Has had some emesis.  Patient otherwise denies any other physical symptoms.  Husband states that he is worried about her mental health.  Has had some vomiting but no diarrhea.  He thinks that she is psychotic as well as she is noncompliant.  Medical clearance labs show a sodium of 116.  Creatinine 1.22.  Otherwise lab work is unremarkable.  Hemoglobin is 15 and about a month ago it was 10.  Patient likely is hypovolemic.  We will get urine sodium and serum osmolality to further evaluate.  To be admitted for hyponatremia.  IVC was placed as patient will need psychiatric evaluation as well after medical clearance.  This chart was dictated using voice recognition software.  Despite best efforts to proofread,  errors can occur which can change the documentation meaning.    Final Clinical Impression(s) / ED Diagnoses Final diagnoses:   Hyponatremia    Rx / DC Orders ED Discharge Orders    None       Virgina Norfolk, DO 02/27/20 2205

## 2020-02-28 DIAGNOSIS — K420 Umbilical hernia with obstruction, without gangrene: Secondary | ICD-10-CM | POA: Diagnosis present

## 2020-02-28 DIAGNOSIS — E871 Hypo-osmolality and hyponatremia: Secondary | ICD-10-CM | POA: Diagnosis present

## 2020-02-28 DIAGNOSIS — E876 Hypokalemia: Secondary | ICD-10-CM | POA: Diagnosis present

## 2020-02-28 DIAGNOSIS — Z8616 Personal history of COVID-19: Secondary | ICD-10-CM | POA: Diagnosis not present

## 2020-02-28 DIAGNOSIS — F250 Schizoaffective disorder, bipolar type: Secondary | ICD-10-CM | POA: Diagnosis present

## 2020-02-28 DIAGNOSIS — R7401 Elevation of levels of liver transaminase levels: Secondary | ICD-10-CM | POA: Diagnosis present

## 2020-02-28 DIAGNOSIS — Z79899 Other long term (current) drug therapy: Secondary | ICD-10-CM | POA: Diagnosis not present

## 2020-02-28 DIAGNOSIS — Z9114 Patient's other noncompliance with medication regimen: Secondary | ICD-10-CM | POA: Diagnosis not present

## 2020-02-28 DIAGNOSIS — N1831 Chronic kidney disease, stage 3a: Secondary | ICD-10-CM | POA: Diagnosis present

## 2020-02-28 DIAGNOSIS — K439 Ventral hernia without obstruction or gangrene: Secondary | ICD-10-CM

## 2020-02-28 DIAGNOSIS — Z833 Family history of diabetes mellitus: Secondary | ICD-10-CM | POA: Diagnosis not present

## 2020-02-28 DIAGNOSIS — F259 Schizoaffective disorder, unspecified: Secondary | ICD-10-CM | POA: Diagnosis not present

## 2020-02-28 DIAGNOSIS — E878 Other disorders of electrolyte and fluid balance, not elsewhere classified: Secondary | ICD-10-CM | POA: Diagnosis present

## 2020-02-28 DIAGNOSIS — Z6836 Body mass index (BMI) 36.0-36.9, adult: Secondary | ICD-10-CM | POA: Diagnosis not present

## 2020-02-28 DIAGNOSIS — Z781 Physical restraint status: Secondary | ICD-10-CM | POA: Diagnosis not present

## 2020-02-28 DIAGNOSIS — K56600 Partial intestinal obstruction, unspecified as to cause: Secondary | ICD-10-CM | POA: Diagnosis present

## 2020-02-28 DIAGNOSIS — Z8249 Family history of ischemic heart disease and other diseases of the circulatory system: Secondary | ICD-10-CM | POA: Diagnosis not present

## 2020-02-28 DIAGNOSIS — N179 Acute kidney failure, unspecified: Secondary | ICD-10-CM | POA: Diagnosis present

## 2020-02-28 DIAGNOSIS — E860 Dehydration: Secondary | ICD-10-CM | POA: Diagnosis present

## 2020-02-28 DIAGNOSIS — I129 Hypertensive chronic kidney disease with stage 1 through stage 4 chronic kidney disease, or unspecified chronic kidney disease: Secondary | ICD-10-CM | POA: Diagnosis present

## 2020-02-28 DIAGNOSIS — E669 Obesity, unspecified: Secondary | ICD-10-CM | POA: Diagnosis present

## 2020-02-28 LAB — CBC WITH DIFFERENTIAL/PLATELET
Abs Immature Granulocytes: 0.06 10*3/uL (ref 0.00–0.07)
Basophils Absolute: 0 10*3/uL (ref 0.0–0.1)
Basophils Relative: 0 %
Eosinophils Absolute: 0 10*3/uL (ref 0.0–0.5)
Eosinophils Relative: 0 %
HCT: 42.7 % (ref 36.0–46.0)
Hemoglobin: 15 g/dL (ref 12.0–15.0)
Immature Granulocytes: 1 %
Lymphocytes Relative: 17 %
Lymphs Abs: 1.9 10*3/uL (ref 0.7–4.0)
MCH: 28.2 pg (ref 26.0–34.0)
MCHC: 35.1 g/dL (ref 30.0–36.0)
MCV: 80.4 fL (ref 80.0–100.0)
Monocytes Absolute: 0.7 10*3/uL (ref 0.1–1.0)
Monocytes Relative: 6 %
Neutro Abs: 8.2 10*3/uL — ABNORMAL HIGH (ref 1.7–7.7)
Neutrophils Relative %: 76 %
Platelets: 339 10*3/uL (ref 150–400)
RBC: 5.31 MIL/uL — ABNORMAL HIGH (ref 3.87–5.11)
RDW: 15.7 % — ABNORMAL HIGH (ref 11.5–15.5)
WBC: 10.9 10*3/uL — ABNORMAL HIGH (ref 4.0–10.5)
nRBC: 0 % (ref 0.0–0.2)

## 2020-02-28 LAB — BASIC METABOLIC PANEL
Anion gap: 18 — ABNORMAL HIGH (ref 5–15)
Anion gap: 16 — ABNORMAL HIGH (ref 5–15)
BUN: 48 mg/dL — ABNORMAL HIGH (ref 6–20)
BUN: 42 mg/dL — ABNORMAL HIGH (ref 6–20)
CO2: 31 mmol/L (ref 22–32)
CO2: 31 mmol/L (ref 22–32)
Calcium: 9 mg/dL (ref 8.9–10.3)
Calcium: 8.7 mg/dL — ABNORMAL LOW (ref 8.9–10.3)
Chloride: 67 mmol/L — ABNORMAL LOW (ref 98–111)
Chloride: 74 mmol/L — ABNORMAL LOW (ref 98–111)
Creatinine, Ser: 1.2 mg/dL — ABNORMAL HIGH (ref 0.44–1.00)
Creatinine, Ser: 1.08 mg/dL — ABNORMAL HIGH (ref 0.44–1.00)
GFR calc Af Amer: 58 mL/min — ABNORMAL LOW (ref >60)
GFR calc Af Amer: >60 mL/min (ref >60)
GFR calc non Af Amer: 50 mL/min — ABNORMAL LOW (ref >60)
GFR calc non Af Amer: 57 mL/min — ABNORMAL LOW (ref >60)
Glucose, Bld: 111 mg/dL — ABNORMAL HIGH (ref 70–99)
Glucose, Bld: 98 mg/dL (ref 70–99)
Potassium: 3 mmol/L — ABNORMAL LOW (ref 3.5–5.1)
Potassium: 3.1 mmol/L — ABNORMAL LOW (ref 3.5–5.1)
Sodium: 116 mmol/L — CL (ref 135–145)
Sodium: 121 mmol/L — ABNORMAL LOW (ref 135–145)

## 2020-02-28 LAB — COMPREHENSIVE METABOLIC PANEL
ALT: 50 U/L — ABNORMAL HIGH (ref 0–44)
ALT: 49 U/L — ABNORMAL HIGH (ref 0–44)
AST: 35 U/L (ref 15–41)
AST: 35 U/L (ref 15–41)
Albumin: 3.7 g/dL (ref 3.5–5.0)
Albumin: 3.6 g/dL (ref 3.5–5.0)
Alkaline Phosphatase: 87 U/L (ref 38–126)
Alkaline Phosphatase: 83 U/L (ref 38–126)
Anion gap: 17 — ABNORMAL HIGH (ref 5–15)
Anion gap: 16 — ABNORMAL HIGH (ref 5–15)
BUN: 43 mg/dL — ABNORMAL HIGH (ref 6–20)
BUN: 40 mg/dL — ABNORMAL HIGH (ref 6–20)
CO2: 31 mmol/L (ref 22–32)
CO2: 30 mmol/L (ref 22–32)
Calcium: 8.9 mg/dL (ref 8.9–10.3)
Calcium: 8.8 mg/dL — ABNORMAL LOW (ref 8.9–10.3)
Chloride: 73 mmol/L — ABNORMAL LOW (ref 98–111)
Chloride: 75 mmol/L — ABNORMAL LOW (ref 98–111)
Creatinine, Ser: 1.05 mg/dL — ABNORMAL HIGH (ref 0.44–1.00)
Creatinine, Ser: 1.12 mg/dL — ABNORMAL HIGH (ref 0.44–1.00)
GFR calc Af Amer: >60 mL/min (ref >60)
GFR calc Af Amer: >60 mL/min (ref >60)
GFR calc non Af Amer: 58 mL/min — ABNORMAL LOW (ref >60)
GFR calc non Af Amer: 54 mL/min — ABNORMAL LOW (ref >60)
Glucose, Bld: 102 mg/dL — ABNORMAL HIGH (ref 70–99)
Glucose, Bld: 98 mg/dL (ref 70–99)
Potassium: 3.2 mmol/L — ABNORMAL LOW (ref 3.5–5.1)
Potassium: 3.2 mmol/L — ABNORMAL LOW (ref 3.5–5.1)
Sodium: 121 mmol/L — ABNORMAL LOW (ref 135–145)
Sodium: 121 mmol/L — ABNORMAL LOW (ref 135–145)
Total Bilirubin: 0.9 mg/dL (ref 0.3–1.2)
Total Bilirubin: 1 mg/dL (ref 0.3–1.2)
Total Protein: 7.5 g/dL (ref 6.5–8.1)
Total Protein: 7.3 g/dL (ref 6.5–8.1)

## 2020-02-28 LAB — HEPATIC FUNCTION PANEL
ALT: 45 U/L — ABNORMAL HIGH (ref 0–44)
AST: 35 U/L (ref 15–41)
Albumin: 3.8 g/dL (ref 3.5–5.0)
Alkaline Phosphatase: 87 U/L (ref 38–126)
Bilirubin, Direct: 0.2 mg/dL (ref 0.0–0.2)
Indirect Bilirubin: 0.5 mg/dL (ref 0.3–0.9)
Total Bilirubin: 0.7 mg/dL (ref 0.3–1.2)
Total Protein: 7.6 g/dL (ref 6.5–8.1)

## 2020-02-28 LAB — TSH: TSH: 0.861 u[IU]/mL (ref 0.350–4.500)

## 2020-02-28 LAB — OSMOLALITY: Osmolality: 258 mOsm/kg — ABNORMAL LOW (ref 275–295)

## 2020-02-28 LAB — CORTISOL: Cortisol, Plasma: 22.4 ug/dL

## 2020-02-28 LAB — URIC ACID: Uric Acid, Serum: 12.9 mg/dL — ABNORMAL HIGH (ref 2.5–7.1)

## 2020-02-28 LAB — HIV ANTIBODY (ROUTINE TESTING W REFLEX): HIV Screen 4th Generation wRfx: NONREACTIVE

## 2020-02-28 MED ORDER — POTASSIUM CHLORIDE 10 MEQ/100ML IV SOLN
10.0000 meq | INTRAVENOUS | Status: AC
  Administered 2020-02-28 – 2020-02-29 (×4): 10 meq via INTRAVENOUS
  Filled 2020-02-28 (×3): qty 100

## 2020-02-28 MED ORDER — POTASSIUM CHLORIDE 10 MEQ/100ML IV SOLN
10.0000 meq | INTRAVENOUS | Status: AC
  Administered 2020-02-28 (×2): 10 meq via INTRAVENOUS
  Filled 2020-02-28 (×2): qty 100

## 2020-02-28 MED ORDER — PIPERACILLIN-TAZOBACTAM 3.375 G IVPB
3.3750 g | Freq: Three times a day (TID) | INTRAVENOUS | Status: DC
  Administered 2020-02-28 – 2020-03-03 (×13): 3.375 g via INTRAVENOUS
  Filled 2020-02-28 (×11): qty 50

## 2020-02-28 MED ORDER — HALOPERIDOL LACTATE 5 MG/ML IJ SOLN
2.0000 mg | Freq: Four times a day (QID) | INTRAMUSCULAR | Status: DC | PRN
  Administered 2020-02-28 – 2020-03-03 (×2): 2 mg via INTRAMUSCULAR
  Filled 2020-02-28 (×5): qty 1

## 2020-02-28 MED ORDER — POTASSIUM CHLORIDE IN NACL 20-0.9 MEQ/L-% IV SOLN
INTRAVENOUS | Status: DC
  Filled 2020-02-28: qty 1000

## 2020-02-28 MED ORDER — OLANZAPINE 5 MG PO TBDP
5.0000 mg | ORAL_TABLET | Freq: Two times a day (BID) | ORAL | Status: DC
  Administered 2020-02-28 – 2020-03-02 (×3): 5 mg via ORAL
  Filled 2020-02-28 (×12): qty 1

## 2020-02-28 NOTE — Progress Notes (Signed)
Spoke w/ pt daughter Florentina Jenny) and informed her of needing to place patient in restraints due to pulling IV lines out and being a danger to herself. Patient daughter verbalized understanding. Gave her an update on plan for patient. Patient daughter verbalized understanding.

## 2020-02-28 NOTE — Progress Notes (Signed)
Pt arrived on unit via stretcher to room 1526. Alert to self, in manic state but pleasant. Aware of room, surroundings,callbell and 1:1 sitter present. unsteady on feet initially, BSC in place.will continue to monitor

## 2020-02-28 NOTE — Progress Notes (Signed)
Pharmacy Antibiotic Note  Sally Wang is a 59 y.o. female admitted on 02/27/2020 with Intra-abdominal infection.  Pharmacy has been consulted for zosyn dosing.  Plan: Zosyn 3.375g IV q8h (4 hour infusion).  Height: 5\' 7"  (170.2 cm) Weight: 100.5 kg (221 lb 9 oz) IBW/kg (Calculated) : 61.6  Temp (24hrs), Avg:97.9 F (36.6 C), Min:97.6 F (36.4 C), Max:98.3 F (36.8 C)  Recent Labs  Lab 02/27/20 1924 02/28/20 0039  WBC 9.7 10.9*  CREATININE 1.22* 1.20*    Estimated Creatinine Clearance: 62.3 mL/min (A) (by C-G formula based on SCr of 1.2 mg/dL (H)).    No Known Allergies  Antimicrobials this admission: Zosyn 02/28/2020 >>   Dose adjustments this admission: -  Microbiology results: -  Thank you for allowing pharmacy to be a part of this patient's care.  03/01/2020 [place] 02/28/2020 6:54 AM

## 2020-02-28 NOTE — Progress Notes (Signed)
PIV consult: pt agreeable to IV placement in hand only. 24g placed. Please monitor site for signs of redness or edema.

## 2020-02-28 NOTE — Progress Notes (Signed)
AMION page sent to Sally Wang w/ [facility].

## 2020-02-28 NOTE — Progress Notes (Signed)
AMION page sent to Dr. Marland Mcalpine about patient pulling IV out and being very agitated. Patient doesn't want staff in room at this time. Requested IM medication to help w/ agitation.

## 2020-02-28 NOTE — Consult Note (Signed)
Attestation signed by Abigail Miyamoto, MD at 02/28/2020  3:07 PM  I have seen and examined the patient and agree with the assessment and plans.   She will need surgery if the hernia can not be reduced.  Hopefully can examine once seen by psychiatry   Douglas A. Magnus Ivan  MD, FACS     Expand All Collapse All  Show:Clear all [] Manual[] Template[] Copied  Added by: [x] , PA-C  [] Hover for details [facility] Surgery Consult Note   Sally Wang [birth year]  [facility].     Requesting MD: Dr. Shyrl Numbers Chief Complaint: nausea and vomiting Reason for Consult: Possible incarcerated umbilical hernia   HPI:  Patient is a 59 year old female with a history of hypertension, schizoaffective disorder recently admitted and discharged from behavioral health on 02/05/2020.  Since discharge from behavioral health she has not taken any of her medicines.  She was brought to the ED by her husband who provided most of the history with persistent nausea and vomiting for the last week.  They were unsure when she had her last bowel movement.  Patient was also recently Covid positive.  In the ED she was confused she does not allow an exam.   Work-up in the ED shows she is afebrile she had some elevated blood pressure but this improved.  Sodium is 116, potassium is 3.0, chloride 69, glucose 115, BUN 45, creatinine 1.22, anion gap was 17, lipase was 66.  WBC 9.7, hemoglobin 15.4, hematocrit 43.9, platelets 385,000.  hCG negative drug screen is negative.  CT scan read at 11:30 PM last evening shows a proximal partial small bowel obstruction at the level of the anterior umbilical hernia with findings that could be suggestive of bowel incarceration.  The stomach appeared normal.  There was a moderately dilated jejunum and proximal ileum with air-fluid levels measuring up to 3.9 cm down to the level of the anterior umbilical hernia with is a focal loop of ileum and surrounding fat stranding changes  and possible wall thickening.  The distal ileal loop exiting the hernia appears decompressed with the remainder of the distal ileum and terminal ileum appears to be decompressed.  Patient was managed throughout the evening.  She has pulled out her IV and remains agitated.   ROS: Review of Systems  Unable to perform ROS: Psychiatric disorder      Family History  Problem Relation Age of Onset  . Coronary artery disease Father          died 65 yo  . Diabetes Mother          died 43  . Hypertension Brother            Past Medical History:  Diagnosis Date  . Hypertension    . Schizoaffective disorder [facility])             Past Surgical History:  Procedure Laterality Date  . CHOLECYSTECTOMY   2006      Social History:  reports that she has never smoked. She has never used smokeless tobacco. She reports that she does not drink alcohol and does not use drugs.   Allergies: No Known Allergies         Medications Prior to Admission  Medication Sig Dispense Refill  . amLODipine (NORVASC) 10 MG tablet Take 1 tablet (10 mg total) by mouth daily. 30 tablet 0  . carbamazepine (TEGRETOL) 100 MG chewable tablet Chew 3 tablets (300 mg total) by mouth 2 (two) times daily. 180 tablet 0  .  hydrochlorothiazide (HYDRODIURIL) 25 MG tablet Take 1 tablet (25 mg total) by mouth daily. 30 tablet 0  . risperiDONE (RISPERDAL M-TABS) 2 MG disintegrating tablet Take 1 tablet (2 mg total) by mouth daily. 30 tablet 0  . risperiDONE (RISPERDAL M-TABS) 3 MG disintegrating tablet Take 2 tablets (6 mg total) by mouth at bedtime. 60 tablet 0      Blood pressure (!) 135/57, pulse (!) 105, temperature 97.6 F (36.4 C), temperature source Oral, resp. rate 18, height  (1.702 m), weight 100.5 kg, SpO2 98 %. Physical Exam:  General: pleasant, WD, AA female who sitting up in the chair with a sitter.  She is very pleasant, she says she does not have a hernia and that God will heal her.  She will not allow me to  examine her. So far no exam has been possible outside of observing her in the chair and talking with her.     Lab Results Last 48 Hours        Results for orders placed or performed during the hospital encounter of 02/27/20 (from the past 48 hour(s))  SARS Coronavirus 2 by RT PCR (hospital order, performed in [facility] hospital lab) Nasopharyngeal Nasopharyngeal Swab     Status: None    Collection Time: 02/27/20  7:24 PM    Specimen: Nasopharyngeal Swab  Result Value Ref Range    SARS Coronavirus 2 NEGATIVE NEGATIVE      Comment: (NOTE) SARS-CoV-2 target nucleic acids are NOT DETECTED.   The SARS-CoV-2 RNA is generally detectable in upper and lower respiratory specimens during the acute phase of infection. The lowest concentration of SARS-CoV-2 viral copies this assay can detect is 250 copies / mL. A negative result does not preclude SARS-CoV-2 infection and should not be used as the sole basis for treatment or other patient management decisions.  A negative result may occur with improper specimen collection / handling, submission of specimen other than nasopharyngeal swab, presence of viral mutation(s) within the areas targeted by this assay, and inadequate number of viral copies (<250 copies / mL). A negative result must be combined with clinical observations, patient history, and epidemiological information.   Fact Sheet for Patients:   BoilerBrush.com.cy   Fact Sheet for Healthcare Providers: https://pope.com/   This test is not yet approved or  cleared by the [place] FDA and has been authorized for detection and/or diagnosis of SARS-CoV-2 by FDA under an Emergency Use Authorization (EUA).  This EUA will remain in effect (meaning this test can be used) for the duration of the COVID-19 declaration under Section 564(b)(1) of the Act, 21 U.S.C. section 360bbb-3(b)(1), unless the authorization is terminated or revoked  sooner.   Performed at [facility], [address] [place], [place] [address]    Comprehensive metabolic panel     Status: Abnormal    Collection Time: 02/27/20  7:24 PM  Result Value Ref Range    Sodium 116 (LL) 135 - 145 mmol/L      Comment: CRITICAL RESULT CALLED TO, READ BACK BY AND VERIFIED WITH: MCIVER,M. RN  02/27/20 BILLINGSLEY,L      Potassium 3.0 (L) 3.5 - 5.1 mmol/L    Chloride 69 (L) 98 - 111 mmol/L    CO2 30 22 - 32 mmol/L    Glucose, Bld 115 (H) 70 - 99 mg/dL      Comment: Glucose reference range applies only to samples taken after fasting for at least 8 hours.    BUN  45 (H) 6 - 20 mg/dL    Creatinine, Ser 5.62 (H) 0.44 - 1.00 mg/dL    Calcium 8.6 (L) 8.9 - 10.3 mg/dL    Total Protein 7.9 6.5 - 8.1 g/dL    Albumin 3.9 3.5 - 5.0 g/dL    AST 34 15 - 41 U/L    ALT 44 0 - 44 U/L    Alkaline Phosphatase 87 38 - 126 U/L    Total Bilirubin 0.8 0.3 - 1.2 mg/dL    GFR calc non Af Amer 49 (L) >60 mL/min    GFR calc Af Amer 57 (L) >60 mL/min    Anion gap 17 (H) 5 - 15      Comment: Performed at [facility], [address], [place], [place] [address]  Ethanol     Status: None    Collection Time: 02/27/20  7:24 PM  Result Value Ref Range    Alcohol, Ethyl (B) <10 <10 mg/dL      Comment: (NOTE) Lowest detectable limit for serum alcohol is 10 mg/dL.   For medical purposes only. Performed at [facility], [address], [place], [place] [address]    Urine rapid drug screen (hosp performed)     Status: None    Collection Time: 02/27/20  7:24 PM  Result Value Ref Range    Opiates NONE DETECTED NONE DETECTED    Cocaine NONE DETECTED NONE DETECTED    Benzodiazepines NONE DETECTED NONE DETECTED    Amphetamines NONE DETECTED NONE DETECTED    Tetrahydrocannabinol NONE DETECTED NONE DETECTED    Barbiturates NONE DETECTED NONE DETECTED      Comment: (NOTE) DRUG SCREEN FOR MEDICAL PURPOSES ONLY.  IF CONFIRMATION  IS NEEDED FOR ANY PURPOSE, NOTIFY LAB WITHIN 5 DAYS.   LOWEST DETECTABLE LIMITS FOR URINE DRUG SCREEN Drug Class                     Cutoff (ng/mL) Amphetamine and metabolites    1000 Barbiturate and metabolites    200 Benzodiazepine                 200 Tricyclics and metabolites     300 Opiates and metabolites        300 Cocaine and metabolites        300 THC                            50 Performed at [facility], [address], [place], [place] [address]    CBC with Diff     Status: Abnormal    Collection Time: 02/27/20  7:24 PM  Result Value Ref Range    WBC 9.7 4.0 - 10.5 K/uL    RBC 5.47 (H) 3.87 - 5.11 MIL/uL    Hemoglobin 15.4 (H) 12.0 - 15.0 g/dL    HCT 28.4 36 - 46 %    MCV 80.3 80.0 - 100.0 fL    MCH 28.2 26.0 - 34.0 pg    MCHC 35.1 30.0 - 36.0 g/dL    RDW 13.2 (H) 44.0 - 15.5 %    Platelets 385 150 - 400 K/uL    nRBC 0.0 0.0 - 0.2 %    Neutrophils Relative % 72 %    Neutro Abs 7.0 1.7 - 7.7 K/uL    Lymphocytes Relative 21 %    Lymphs Abs 2.0 0.7 - 4.0 K/uL  Monocytes Relative 6 %    Monocytes Absolute 0.6 0 - 1 K/uL    Eosinophils Relative 0 %    Eosinophils Absolute 0.0 0 - 0 K/uL    Basophils Relative 0 %    Basophils Absolute 0.0 0 - 0 K/uL    Immature Granulocytes 1 %    Abs Immature Granulocytes 0.07 0.00 - 0.07 K/uL      Comment: Performed at [facility], [place] [address], [place], [place] [address]  Lipase, blood     Status: Abnormal    Collection Time: 02/27/20  7:24 PM  Result Value Ref Range    Lipase 66 (H) 11 - 51 U/L      Comment: Performed at [facility], [place] [address] [place], [place] [address]  Osmolality, urine     Status: None    Collection Time: 02/27/20  7:24 PM  Result Value Ref Range    Osmolality, Ur 451 300 - 900 mOsm/kg      Comment: PERFORMED AT [facility] Performed at [facility], [place] [address] [place], [place] [address]    I-Stat beta hCG  blood, ED     Status: None    Collection Time: 02/27/20  7:31 PM  Result Value Ref Range    I-stat hCG, quantitative <5.0 <5 mIU/mL    Comment 3               Comment:   GEST. AGE      CONC.  (mIU/mL)   <=1 WEEK        5 - 50     2 WEEKS       50 - 500     3 WEEKS       100 - 10,000     4 WEEKS     1,000 - 30,000        FEMALE AND NON-PREGNANT FEMALE:     LESS THAN 5 mIU/mL    Na and K (sodium & potassium), rand urine     Status: None    Collection Time: 02/27/20  7:31 PM  Result Value Ref Range    Sodium, Ur <10 mmol/L    Potassium Urine 38 mmol/L      Comment: Performed at [facility], [place] [address] [place], [place] [address]  Osmolality     Status: Abnormal    Collection Time: 02/27/20 10:23 PM  Result Value Ref Range    Osmolality 258 (L) 275 - 295 mOsm/kg      Comment: PERFORMED AT [facility] Performed at [facility], [place] [address] [place], [place] [address]    Basic metabolic panel     Status: Abnormal    Collection Time: 02/28/20 12:39 AM  Result Value Ref Range    Sodium 116 (LL) 135 - 145 mmol/L      Comment: CRITICAL RESULT CALLED TO, READ BACK BY AND VERIFIED WITH: Mee Hives, RN @ 0158 ON 02/28/20 C VARNER      Potassium 3.0 (L) 3.5 - 5.1 mmol/L    Chloride 67 (L) 98 - 111 mmol/L    CO2 31 22 - 32 mmol/L    Glucose, Bld 111 (H) 70 - 99 mg/dL      Comment: Glucose reference range applies only to samples taken after fasting for at least 8 hours.    BUN 48 (H) 6 - 20 mg/dL    Creatinine, Ser 1.20 (H) 0.44 -  1.00 mg/dL    Calcium 9.0 8.9 - 74.2 mg/dL    GFR calc non Af Amer 50 (L) >60 mL/min    GFR calc Af Amer 58 (L) >60 mL/min    Anion gap 18 (H) 5 - 15      Comment: Performed at [facility], [address], [place], [place] [address]  Hepatic function panel     Status: Abnormal    Collection Time: 02/28/20 12:39 AM  Result Value Ref Range    Total Protein 7.6 6.5 - 8.1 g/dL    Albumin 3.8 3.5 - 5.0  g/dL    AST 35 15 - 41 U/L    ALT 45 (H) 0 - 44 U/L    Alkaline Phosphatase 87 38 - 126 U/L    Total Bilirubin 0.7 0.3 - 1.2 mg/dL    Bilirubin, Direct 0.2 0.0 - 0.2 mg/dL    Indirect Bilirubin 0.5 0.3 - 0.9 mg/dL      Comment: Performed at [facility], [address], [place], [place] [address]  CBC WITH DIFFERENTIAL     Status: Abnormal    Collection Time: 02/28/20 12:39 AM  Result Value Ref Range    WBC 10.9 (H) 4.0 - 10.5 K/uL    RBC 5.31 (H) 3.87 - 5.11 MIL/uL    Hemoglobin 15.0 12.0 - 15.0 g/dL    HCT 33.2 36 - 46 %    MCV 80.4 80.0 - 100.0 fL    MCH 28.2 26.0 - 34.0 pg    MCHC 35.1 30.0 - 36.0 g/dL    RDW 95.1 (H) 88.4 - 15.5 %    Platelets 339 150 - 400 K/uL    nRBC 0.0 0.0 - 0.2 %    Neutrophils Relative % 76 %    Neutro Abs 8.2 (H) 1.7 - 7.7 K/uL    Lymphocytes Relative 17 %    Lymphs Abs 1.9 0.7 - 4.0 K/uL    Monocytes Relative 6 %    Monocytes Absolute 0.7 0 - 1 K/uL    Eosinophils Relative 0 %    Eosinophils Absolute 0.0 0 - 0 K/uL    Basophils Relative 0 %    Basophils Absolute 0.0 0 - 0 K/uL    Immature Granulocytes 1 %    Abs Immature Granulocytes 0.06 0.00 - 0.07 K/uL      Comment: Performed at [facility], [address], [place], [place] [address]  TSH     Status: None    Collection Time: 02/28/20 12:39 AM  Result Value Ref Range    TSH 0.861 0.350 - 4.500 uIU/mL      Comment: Performed by a 3rd Generation assay with a functional sensitivity of <=0.01 uIU/mL. Performed at [facility], [address] [place], [place] [address]    Cortisol     Status: None    Collection Time: 02/28/20 12:39 AM  Result Value Ref Range    Cortisol, Plasma 22.4 ug/dL      Comment: (NOTE) AM    6.7 - 22.6 ug/dL PM   <10.9       ug/dL Performed at [facility], [address] [place], [place] [address]    Uric acid     Status: Abnormal    Collection Time: 02/28/20 12:39 AM  Result Value Ref Range     Uric Acid, Serum 12.9 (H) 2.5 - 7.1 mg/dL      Comment: Performed at  [facility], [address] [place], [place] [id number]       Imaging Results (Last 48 hours)  CT RENAL STONE STUDY   Result Date: 02/27/2020 CLINICAL DATA:  Abdominal pain nausea vomiting EXAM: CT ABDOMEN AND PELVIS WITHOUT CONTRAST TECHNIQUE: Multidetector CT imaging of the abdomen and pelvis was performed following the standard protocol without IV contrast. COMPARISON:  None. FINDINGS: Lower chest: The visualized heart size within normal limits. No pericardial fluid/thickening. No hiatal hernia. The visualized portions of the lungs are clear. Hepatobiliary: Although limited due to the lack of intravenous contrast, normal in appearance without gross focal abnormality. The patient is status post cholecystectomy. No biliary ductal dilation. Pancreas:  Unremarkable.  No surrounding inflammatory changes. Spleen: Normal in size. Although limited due to the lack of intravenous contrast, normal in appearance. Adrenals/Urinary Tract: Both adrenal glands appear normal. The kidneys and collecting system appear normal without evidence of urinary tract calculus or hydronephrosis. Bladder is unremarkable. Stomach/Bowel: The stomach is normal in appearance. There is moderately dilated jejunum and proximal ileum with air and fluid measuring up to 3.9 cm down to the level of the anterior umbilical hernia where there is a focal loop of ileum with surrounding fat stranding changes and possible wall thickening. The distal ileal loop exiting the hernia appears to be decompressed with the remainder of the distal ileum and terminal ileum appear to be decompressed. There is scattered colonic diverticula without diverticulitis. Vascular/Lymphatic: There are no enlarged abdominal or pelvic lymph nodes. Scattered aortic atherosclerotic calcifications are seen without aneurysmal dilatation. Reproductive: The uterus and adnexa are unremarkable.  Other: No evidence of abdominal wall mass or hernia. Musculoskeletal: No acute or significant osseous findings. IMPRESSION: Proximal partial small bowel obstruction to the level of the anterior umbilical hernia with findings that could be suggestive of focal bowel incarceration. The remainder of the distal ileal loops are decompressed. No renal or collecting system calculi. Aortic Atherosclerosis (ICD10-I70.0). Diverticulosis without diverticulitis. Electronically Signed   By: Jonna ClarkBindu  Avutu M.D.   On: 02/27/2020 23:50           Assessment/Plan Schizoaffective disorder -off medications Delusions/possible psychosis IVC last evening Severe hyponatremia Acute kidney disease Dehydration     Nausea and vomiting with umbilical hernia by CT.   FEN: N.p.o./IV fluids removed by the patient ID: Zosyn 6/16 >> 1 dose DVT: None   Plan: She has received 1 dose of Haldol this AM.  She is pleasant and appears comfortable sitting in the chair.  She cannot really answer many questions, when you ask her anything she says she is fine and God will take care of everything.  She does not want to be examined.  She expects to go home very soon.   I have asked the nursing staff to give us a call when psychiatry comes up to see her and perhaps we can examine her together.  We will continue to follow closely.    3:15 PM Pt in bed more amenable to some exam. HEENT:  WNL Neck: no bruits, JVD Chest:  Clear to ascultation Card:  Regular rate and rhythm GI:  Soft abdomen, non tender except at the umbilicus; 12 o'clock position. she is tender and I think she has some outpouching that is tender here.  No peritonitis, + BS. GU/Rectal - deferred Extrem:  No clubbing, cyanosis, or edema.  Good distal pulses both feet.   Neuro:  She is rather manic right now, but very friendly and appreciative of what we  are doing, she just doesn't want me touching her umbilicus at that one point. Psyc:  Manic, more cooperative than this  AM.    Plan:  Will discuss with Dr. Magnus Ivan.     Sherrie George [facility] Surgery 02/28/2020, 8:53 AM Please see Amion for pager number during day hours 7:00am-4:30pm            Cosigned by: Abigail Miyamoto, MD at 02/28/2020  3:07 PM  Revision History Note Details  Author Sherrie George, PA-C File Time 02/28/2020  3:07 PM  Author Type Physician Assistant Status Signed  Last Editor Sherrie George, [facility] Service Surgery  Hospital Acct # [id number] Admit Date 02/27/2020

## 2020-02-28 NOTE — Consult Note (Signed)
[facility] Face-to-Face Psychiatry Consult   Reason for Consult:  "Agitation/Psychosis/Schizoaffective Disorder" Referring Physician:  Marikay Alar FNP Patient Identification: Sally Wang MRN:  [id number] Principal Diagnosis: Hyponatremia Diagnosis:  Principal Problem:   Hyponatremia Active Problems:   Schizoaffective disorder (HCC)   ARF (acute renal failure) (HCC)   Hypokalemia   Total Time spent with patient: 30 minutes  Subjective: "My mind is sharper than the two edged sword."   HPI: Sally Wang is a 59 y.o. female patient. Patient states "I don't have any problem in my mind or in my body."  Patient observed refusing exam by medical doctor during assessment, patient states "would everybody please leave the room, I do not need anything." Patient assessed by nurse practitioner.  Patient alert and oriented, participates in assessment. 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  Patient denies auditory visual hallucinations. Patient appears mildly paranoid, states "I am not sure why need to be examined, I can tell you there is nothing wrong with me, why would there be anything wrong with me?" Patient declines to participate further in interview, calmly asked this writer to leave the room.   Past Psychiatric History: Schizoaffective disorder  Risk to Self:   Denies Risk to Others:   Denies Prior Inpatient Therapy:   [facility] Prior Outpatient Therapy:   Currently seen by Rhunette Croft  Past Medical History:  Past Medical History:  Diagnosis Date   Hypertension    Schizoaffective disorder [facility])     Past Surgical History:  Procedure Laterality Date   CHOLECYSTECTOMY  2006   Family History:  Family History  Problem Relation Age of Onset    Coronary artery disease Father        died 46 yo   Diabetes Mother        died 75   Hypertension Brother    Family Psychiatric  History: None reported Social History:  Social History   Substance and Sexual Activity  Alcohol Use No     Social History   Substance and Sexual Activity  Drug Use No    Social History   Socioeconomic History   Marital status: Married    Spouse name: Sally Wang   Number of children: Not on file   Years of education: Not on file   Highest education level: Not on file  Occupational History   Occupation: homemaker  Tobacco Use   Smoking status: Never Smoker   Smokeless tobacco: Never Used  Building services engineer Use: Never used  Substance and Sexual Activity   Alcohol use: No   Drug use: No   Sexual activity: Not Currently  Other Topics Concern   Not on file  Social History Narrative   Husband - Sally Wang [id number]   Tomie China -1987   [name] -2010 granddaughte      Pets -  Pit bulls #4            Social Determinants of Health   Financial Resource Strain:    Difficulty of Paying Living Expenses:   Food Insecurity:    Worried About Programme researcher, broadcasting/film/video in the Last Year:    Barista in the Last Year:   Transportation Needs:    Freight forwarder (Medical):  Lack of Transportation (Non-Medical):   Physical Activity:    Days of Exercise per Week:    Minutes of Exercise per Session:   Stress:    Feeling of Stress :   Social Connections:    Frequency of Communication with Friends and Family:    Frequency of Social Gatherings with Friends and Family:    Attends Religious Services:    Active Member of Clubs or Organizations:    Attends Archivist Meetings:    Marital Status:    Additional Social History:    Allergies:  No Known Allergies  Labs:  Results for orders placed or performed during the hospital encounter of 02/27/20 (from the past 48 hour(s))  SARS Coronavirus 2 by RT PCR  (hospital order, performed in [facility] hospital lab) Nasopharyngeal Nasopharyngeal Swab     Status: None   Collection Time: 02/27/20  7:24 PM   Specimen: Nasopharyngeal Swab  Result Value Ref Range   SARS Coronavirus 2 NEGATIVE NEGATIVE    Comment: (NOTE) SARS-CoV-2 target nucleic acids are NOT DETECTED.  The SARS-CoV-2 RNA is generally detectable in upper and lower respiratory specimens during the acute phase of infection. The lowest concentration of SARS-CoV-2 viral copies this assay can detect is 250 copies / mL. A negative result does not preclude SARS-CoV-2 infection and should not be used as the sole basis for treatment or other patient management decisions.  A negative result may occur with improper specimen collection / handling, submission of specimen other than nasopharyngeal swab, presence of viral mutation(s) within the areas targeted by this assay, and inadequate number of viral copies (<250 copies / mL). A negative result must be combined with clinical observations, patient history, and epidemiological information.  Fact Sheet for Patients:   StrictlyIdeas.no  Fact Sheet for Healthcare Providers: BankingDealers.co.za  This test is not yet approved or  cleared by the [place] FDA and has been authorized for detection and/or diagnosis of SARS-CoV-2 by FDA under an Emergency Use Authorization (EUA).  This EUA will remain in effect (meaning this test can be used) for the duration of the COVID-19 declaration under Section 564(b)(1) of the Act, 21 U.S.C. section 360bbb-3(b)(1), unless the authorization is terminated or revoked sooner.  Performed at [facility], [place] [address] [place], [place] [address]   Comprehensive metabolic panel     Status: Abnormal   Collection Time: 02/27/20  7:24 PM  Result Value Ref Range   Sodium 116 (LL) 135 - 145 mmol/L    Comment: CRITICAL RESULT CALLED TO, READ  BACK BY AND VERIFIED WITH: MCIVER,M. RN @2023  02/27/20 BILLINGSLEY,L    Potassium 3.0 (L) 3.5 - 5.1 mmol/L   Chloride 69 (L) 98 - 111 mmol/L   CO2 30 22 - 32 mmol/L   Glucose, Bld 115 (H) 70 - 99 mg/dL    Comment: Glucose reference range applies only to samples taken after fasting for at least 8 hours.   BUN 45 (H) 6 - 20 mg/dL   Creatinine, Ser 1.22 (H) 0.44 - 1.00 mg/dL   Calcium 8.6 (L) 8.9 - 10.3 mg/dL   Total Protein 7.9 6.5 - 8.1 g/dL   Albumin 3.9 3.5 - 5.0 g/dL   AST 34 15 - 41 U/L   ALT 44 0 - 44 U/L   Alkaline Phosphatase 87 38 - 126 U/L   Total Bilirubin 0.8 0.3 - 1.2 mg/dL   GFR calc non Af Amer 49 (L) >60 mL/min   GFR calc Af  Amer 57 (L) >60 mL/min   Anion gap 17 (H) 5 - 15    Comment: Performed at [facility], [address], [place], [place] [address]  Ethanol     Status: None   Collection Time: 02/27/20  7:24 PM  Result Value Ref Range   Alcohol, Ethyl (B) <10 <10 mg/dL    Comment: (NOTE) Lowest detectable limit for serum alcohol is 10 mg/dL.  For medical purposes only. Performed at [facility], [address], [place], [place] [address]   Urine rapid drug screen (hosp performed)     Status: None   Collection Time: 02/27/20  7:24 PM  Result Value Ref Range   Opiates NONE DETECTED NONE DETECTED   Cocaine NONE DETECTED NONE DETECTED   Benzodiazepines NONE DETECTED NONE DETECTED   Amphetamines NONE DETECTED NONE DETECTED   Tetrahydrocannabinol NONE DETECTED NONE DETECTED   Barbiturates NONE DETECTED NONE DETECTED    Comment: (NOTE) DRUG SCREEN FOR MEDICAL PURPOSES ONLY.  IF CONFIRMATION IS NEEDED FOR ANY PURPOSE, NOTIFY LAB WITHIN 5 DAYS.  LOWEST DETECTABLE LIMITS FOR URINE DRUG SCREEN Drug Class                     Cutoff (ng/mL) Amphetamine and metabolites    1000 Barbiturate and metabolites    200 Benzodiazepine                 200 Tricyclics and metabolites     300 Opiates and metabolites        300 Cocaine  and metabolites        300 THC                            50 Performed at [facility], [address] [place], [place] [address]   CBC with Diff     Status: Abnormal   Collection Time: 02/27/20  7:24 PM  Result Value Ref Range   WBC 9.7 4.0 - 10.5 K/uL   RBC 5.47 (H) 3.87 - 5.11 MIL/uL   Hemoglobin 15.4 (H) 12.0 - 15.0 g/dL   HCT 29.9 36 - 46 %   MCV 80.3 80.0 - 100.0 fL   MCH 28.2 26.0 - 34.0 pg   MCHC 35.1 30.0 - 36.0 g/dL   RDW 24.2 (H) 68.3 - 41.9 %   Platelets 385 150 - 400 K/uL   nRBC 0.0 0.0 - 0.2 %   Neutrophils Relative % 72 %   Neutro Abs 7.0 1.7 - 7.7 K/uL   Lymphocytes Relative 21 %   Lymphs Abs 2.0 0.7 - 4.0 K/uL   Monocytes Relative 6 %   Monocytes Absolute 0.6 0 - 1 K/uL   Eosinophils Relative 0 %   Eosinophils Absolute 0.0 0 - 0 K/uL   Basophils Relative 0 %   Basophils Absolute 0.0 0 - 0 K/uL   Immature Granulocytes 1 %   Abs Immature Granulocytes 0.07 0.00 - 0.07 K/uL    Comment: Performed at [facility], [address], [place], [place] [address]  Lipase, blood     Status: Abnormal   Collection Time: 02/27/20  7:24 PM  Result Value Ref Range   Lipase 66 (H) 11 - 51 U/L    Comment: Performed at [facility], [address], [place], [place] [address]  Osmolality, urine     Status: None   Collection Time: 02/27/20  7:24 PM  Result Value Ref Range   Osmolality, Ur 451 300 - 900 mOsm/kg    Comment: PERFORMED AT [facility] Performed at [facility], [address], [place], [place] [address]   I-Stat beta hCG blood, ED     Status: None   Collection Time: 02/27/20  7:31 PM  Result Value Ref Range   I-stat hCG, quantitative <5.0 <5 mIU/mL   Comment 3            Comment:   GEST. AGE      CONC.  (mIU/mL)   <=1 WEEK        5 - 50     2 WEEKS       50 - 500     3 WEEKS       100 - 10,000     4 WEEKS     1,000 - 30,000        FEMALE AND NON-PREGNANT FEMALE:     LESS THAN 5 mIU/mL    Na and K (sodium & potassium), rand urine     Status: None   Collection Time: 02/27/20  7:31 PM  Result Value Ref Range   Sodium, Ur <10 mmol/L   Potassium Urine 38 mmol/L    Comment: Performed at [facility], [address], [place], [place] [address]  Osmolality     Status: Abnormal   Collection Time: 02/27/20 10:23 PM  Result Value Ref Range   Osmolality 258 (L) 275 - 295 mOsm/kg    Comment: PERFORMED AT [facility] Performed at [facility], [address] [place], [place] [address]   Basic metabolic panel     Status: Abnormal   Collection Time: 02/28/20 12:39 AM  Result Value Ref Range   Sodium 116 (LL) 135 - 145 mmol/L    Comment: CRITICAL RESULT CALLED TO, READ BACK BY AND VERIFIED WITH: Orlean Bradford, RN @ 0158 ON 02/28/20 C VARNER    Potassium 3.0 (L) 3.5 - 5.1 mmol/L   Chloride 67 (L) 98 - 111 mmol/L   CO2 31 22 - 32 mmol/L   Glucose, Bld 111 (H) 70 - 99 mg/dL    Comment: Glucose reference range applies only to samples taken after fasting for at least 8 hours.   BUN 48 (H) 6 - 20 mg/dL   Creatinine, Ser 9.14 (H) 0.44 - 1.00 mg/dL   Calcium 9.0 8.9 - 78.2 mg/dL   GFR calc non Af Amer 50 (L) >60 mL/min   GFR calc Af Amer 58 (L) >60 mL/min   Anion gap 18 (H) 5 - 15    Comment: Performed at [facility], [address] [place], [place] [address]  Hepatic function panel     Status: Abnormal   Collection Time: 02/28/20 12:39 AM  Result Value Ref Range   Total Protein 7.6 6.5 - 8.1 g/dL   Albumin 3.8 3.5 - 5.0 g/dL   AST 35 15 - 41 U/L   ALT 45 (H) 0 - 44 U/L   Alkaline Phosphatase 87 38 - 126 U/L   Total Bilirubin 0.7 0.3 - 1.2 mg/dL   Bilirubin, Direct 0.2 0.0 - 0.2 mg/dL   Indirect Bilirubin 0.5 0.3 - 0.9 mg/dL    Comment: Performed at [facility], [address] [place], [place] [address]  CBC WITH DIFFERENTIAL     Status: Abnormal   Collection Time: 02/28/20 12:39 AM  Result Value  Ref  Range   WBC 10.9 (H) 4.0 - 10.5 K/uL   RBC 5.31 (H) 3.87 - 5.11 MIL/uL   Hemoglobin 15.0 12.0 - 15.0 g/dL   HCT 16.142.7 36 - 46 %   MCV 80.4 80.0 - 100.0 fL   MCH 28.2 26.0 - 34.0 pg   MCHC 35.1 30.0 - 36.0 g/dL   RDW 09.615.7 (H) 04.511.5 - 40.915.5 %   Platelets 339 150 - 400 K/uL   nRBC 0.0 0.0 - 0.2 %   Neutrophils Relative % 76 %   Neutro Abs 8.2 (H) 1.7 - 7.7 K/uL   Lymphocytes Relative 17 %   Lymphs Abs 1.9 0.7 - 4.0 K/uL   Monocytes Relative 6 %   Monocytes Absolute 0.7 0 - 1 K/uL   Eosinophils Relative 0 %   Eosinophils Absolute 0.0 0 - 0 K/uL   Basophils Relative 0 %   Basophils Absolute 0.0 0 - 0 K/uL   Immature Granulocytes 1 %   Abs Immature Granulocytes 0.06 0.00 - 0.07 K/uL    Comment: Performed at [facility], [address] [place], [place] [address]  TSH     Status: None   Collection Time: 02/28/20 12:39 AM  Result Value Ref Range   TSH 0.861 0.350 - 4.500 uIU/mL    Comment: Performed by a 3rd Generation assay with a functional sensitivity of <=0.01 uIU/mL. Performed at [facility], [address] [place], [place] [address]   Cortisol     Status: None   Collection Time: 02/28/20 12:39 AM  Result Value Ref Range   Cortisol, Plasma 22.4 ug/dL    Comment: (NOTE) AM    6.7 - 22.6 ug/dL PM   <56.2<10.0       ug/dL Performed at [facility], [address] [place], [place] [address]   Uric acid     Status: Abnormal   Collection Time: 02/28/20 12:39 AM  Result Value Ref Range   Uric Acid, Serum 12.9 (H) 2.5 - 7.1 mg/dL    Comment: Performed at [facility], [address] [place], [place] [address]    Current Facility-Administered Medications  Medication Dose Route Frequency Provider Last Rate Last Admin   0.9 % NaCl with KCl 20 mEq/ L  infusion   Intravenous Continuous Eduard ClosKakrakandy, Arshad N, MD   Stopped at 02/28/20 0340   haloperidol lactate (HALDOL) injection 2 mg  2 mg Intramuscular Q6H PRN Marguerita MerlesSheikh,  Omair CartwrightLatif, DO   2 mg at 02/28/20 [id number]   hydrALAZINE (APRESOLINE) injection 10 mg  10 mg Intravenous Q4H PRN Eduard ClosKakrakandy, Arshad N, MD   10 mg at 02/28/20 0331   ondansetron (ZOFRAN) tablet 4 mg  4 mg Oral Q6H PRN Eduard ClosKakrakandy, Arshad N, MD       Or   ondansetron [facility](ZOFRAN) injection 4 mg  4 mg Intravenous Q6H PRN Eduard ClosKakrakandy, Arshad N, MD       piperacillin-tazobactam (ZOSYN) IVPB 3.375 g  3.375 g Intravenous Q8H Eduard ClosKakrakandy, Arshad N, MD 12.5 mL/hr at 02/28/20 0400 Rate Verify at 02/28/20 0400   potassium chloride 10 mEq in 100 mL IVPB  10 mEq Intravenous Q1 Hr x 4 Sheikh, Omair Latif, DO       ziprasidone (GEODON) injection 20 mg  20 mg Intramuscular Once Virgina Norfolkuratolo, Adam, DO        Musculoskeletal: Strength & Muscle Tone: within normal limits Gait & Station: Unable to assess Patient leans: N/A  Psychiatric Specialty Exam:  Physical Exam  Nursing note and vitals reviewed. Constitutional: She is oriented to person, place, and time. She appears well-developed. She is uncooperative.  HENT:  Head: Normocephalic.  Cardiovascular: Normal rate.  Respiratory: Effort normal.  Neurological: She is alert and oriented to person, place, and time.  Psychiatric: Her mood appears anxious. Her speech is tangential. Thought content is paranoid and delusional. She expresses inappropriate judgment.    Review of Systems  Constitutional: Negative.   HENT: Negative.   Eyes: Negative.   Respiratory: Negative.   Cardiovascular: Negative.   Gastrointestinal: Negative.   Genitourinary: Negative.   Musculoskeletal: Negative.   Skin: Negative.   Neurological: Negative.   Psychiatric/Behavioral: The patient is nervous/anxious.     Blood pressure (!) 135/57, pulse (!) 105, temperature 97.6 F (36.4 C), temperature source Oral, resp. rate 18, height 5\' 7"  (1.702 m), weight 100.5 kg, SpO2 98 %.Body mass index is 34.7 kg/m.  General Appearance: Casual and Fairly Groomed  Eye Contact:  Good  Speech:  Clear  and Coherent and Normal Rate  Volume:  Normal  Mood:  Anxious  Affect:  Non-Congruent  Thought Process:  Coherent, Goal Directed and Descriptions of Associations: Tangential  Orientation:  Full (Time, Place, and Person)  Thought Content:  Delusions, Paranoid Ideation and Tangential  Suicidal Thoughts:  No  Homicidal Thoughts:  No  Memory:  Immediate;   Fair Recent;   Fair Remote;   Fair  Judgement:  Impaired  Insight:  Lacking  Psychomotor Activity:  Normal  Concentration:  Concentration: Fair and Attention Span: Fair  Recall:  of Knowledge:  Good  Language:  Good  Akathisia:  No  Handed:  Right  AIMS (if indicated):     Assets:  Communication Skills Desire for Improvement Financial Resources/Insurance Housing Intimacy Leisure Time Physical Health Resilience Social Support  ADL's:  Unable to assess   Cognition:  WNL  Sleep:        Treatment Plan Summary: Patient discussed Dr. [employer].  Patient is a 59 year old female, recently discharged from [facility] behavioral health.  Patient with history of schizoaffective disorder, patient presents with Per patient medical record patient has been noncompliant with medications times approximately 1 month.  Based on my assessment today, patient would benefit from inpatient psychiatric treatment once medically clear.  Patient would also benefit from forced medication administration if continuing to refuse psychotropic medications.  Per medical record patient is currently involuntarily committed.  Medication management: -Recommend consider Zyprexa zydis 5 mg p.o. or IM twice daily. -Recommend consider continue one-to-one continuous observation monitor for safety.   Disposition: Recommend psychiatric Inpatient admission when medically cleared. Supportive therapy provided about ongoing stressors.  [facility], FNP 02/28/2020 9:38 AM

## 2020-02-28 NOTE — Progress Notes (Signed)
CRITICAL VALUE ALERT  Critical Value:  NA+ 116  Date & Time Notied:  [id number]  Provider Notified: yes  Orders Received/Actions taken: no

## 2020-02-28 NOTE — Progress Notes (Signed)
[facility] Surgery Consult Note  JANEANN PAISLEY 13-Feb-1961  [id number].    Requesting MD: Dr. Marguerita Merles Chief Complaint: nausea and vomiting Reason for Consult: Possible incarcerated umbilical hernia  HPI:  Patient is a 59 year old female with a history of hypertension, schizoaffective disorder recently admitted and discharged from behavioral health on 02/05/2020.  Since discharge from behavioral health she has not taken any of her medicines.  She was brought to the ED by her husband who provided most of the history with persistent nausea and vomiting for the last week.  They were unsure when she had her last bowel movement.  Patient was also recently Covid positive.  In the ED she was confused she does not allow an exam.  Work-up in the ED shows she is afebrile she had some elevated blood pressure but this improved.  Sodium is 116, potassium is 3.0, chloride 69, glucose 115, BUN 45, creatinine 1.22, anion gap was 17, lipase was 66.  WBC 9.7, hemoglobin 15.4, hematocrit 43.9, platelets 385,000.  hCG negative drug screen is negative.  CT scan read at 11:30 PM last evening shows a proximal partial small bowel obstruction at the level of the anterior umbilical hernia with findings that could be suggestive of bowel incarceration.  The stomach appeared normal.  There was a moderately dilated jejunum and proximal ileum with air-fluid levels measuring up to 3.9 cm down to the level of the anterior umbilical hernia with is a focal loop of ileum and surrounding fat stranding changes and possible wall thickening.  The distal ileal loop exiting the hernia appears decompressed with the remainder of the distal ileum and terminal ileum appears to be decompressed.  Patient was managed throughout the evening.  She has pulled out her IV and remains agitated.  ROS: Review of Systems  Unable to perform ROS: Psychiatric disorder    Family History  Problem Relation Age of Onset   Coronary artery disease  Father        died 74 yo   Diabetes Mother        died 31   Hypertension Brother     Past Medical History:  Diagnosis Date   Hypertension    Schizoaffective disorder [facility])     Past Surgical History:  Procedure Laterality Date   CHOLECYSTECTOMY  2006    Social History:  reports that she has never smoked. She has never used smokeless tobacco. She reports that she does not drink alcohol and does not use drugs.  Allergies: No Known Allergies  Medications Prior to Admission  Medication Sig Dispense Refill   amLODipine (NORVASC) 10 MG tablet Take 1 tablet (10 mg total) by mouth daily. 30 tablet 0   carbamazepine (TEGRETOL) 100 MG chewable tablet Chew 3 tablets (300 mg total) by mouth 2 (two) times daily. 180 tablet 0   hydrochlorothiazide (HYDRODIURIL) 25 MG tablet Take 1 tablet (25 mg total) by mouth daily. 30 tablet 0   risperiDONE (RISPERDAL M-TABS) 2 MG disintegrating tablet Take 1 tablet (2 mg total) by mouth daily. 30 tablet 0   risperiDONE (RISPERDAL M-TABS) 3 MG disintegrating tablet Take 2 tablets (6 mg total) by mouth at bedtime. 60 tablet 0    Blood pressure (!) 135/57, pulse (!) 105, temperature 97.6 F (36.4 C), temperature source Oral, resp. rate 18, height 5\' 7"  (1.702 m), weight 100.5 kg, SpO2 98 %. Physical Exam:  General: pleasant, WD, AA female who sitting up in the chair with a sitter.  She is very pleasant,  she says she does not have a hernia and that God will heal her.  She will not allow me to examine her. So far no exam has been possible outside of observing her in the chair and talking with her.   Results for orders placed or performed during the hospital encounter of 02/27/20 (from the past 48 hour(s))  SARS Coronavirus 2 by RT PCR (hospital order, performed in [facility] hospital lab) Nasopharyngeal Nasopharyngeal Swab     Status: None   Collection Time: 02/27/20  7:24 PM   Specimen: Nasopharyngeal Swab  Result Value Ref Range   SARS  Coronavirus 2 NEGATIVE NEGATIVE    Comment: (NOTE) SARS-CoV-2 target nucleic acids are NOT DETECTED.  The SARS-CoV-2 RNA is generally detectable in upper and lower respiratory specimens during the acute phase of infection. The lowest concentration of SARS-CoV-2 viral copies this assay can detect is 250 copies / mL. A negative result does not preclude SARS-CoV-2 infection and should not be used as the sole basis for treatment or other patient management decisions.  A negative result may occur with improper specimen collection / handling, submission of specimen other than nasopharyngeal swab, presence of viral mutation(s) within the areas targeted by this assay, and inadequate number of viral copies (<250 copies / mL). A negative result must be combined with clinical observations, patient history, and epidemiological information.  Fact Sheet for Patients:   BoilerBrush.com.cyhttps://www.fda.gov/media/136312/download  Fact Sheet for Healthcare Providers: https://pope.com/https://www.fda.gov/media/136313/download  This test is not yet approved or  cleared by the [place] FDA and has been authorized for detection and/or diagnosis of SARS-CoV-2 by FDA under an Emergency Use Authorization (EUA).  This EUA will remain in effect (meaning this test can be used) for the duration of the COVID-19 declaration under Section 564(b)(1) of the Act, 21 U.S.C. section 360bbb-3(b)(1), unless the authorization is terminated or revoked sooner.  Performed at [facility], [address] [place], [place] [address]   Comprehensive metabolic panel     Status: Abnormal   Collection Time: 02/27/20  7:24 PM  Result Value Ref Range   Sodium 116 (LL) 135 - 145 mmol/L    Comment: CRITICAL RESULT CALLED TO, READ BACK BY AND VERIFIED WITH: MCIVER,M. RN @2023  02/27/20 BILLINGSLEY,L    Potassium 3.0 (L) 3.5 - 5.1 mmol/L   Chloride 69 (L) 98 - 111 mmol/L   CO2 30 22 - 32 mmol/L   Glucose, Bld 115 (H) 70 - 99 mg/dL     Comment: Glucose reference range applies only to samples taken after fasting for at least 8 hours.   BUN 45 (H) 6 - 20 mg/dL   Creatinine, Ser 6.041.22 (H) 0.44 - 1.00 mg/dL   Calcium 8.6 (L) 8.9 - 10.3 mg/dL   Total Protein 7.9 6.5 - 8.1 g/dL   Albumin 3.9 3.5 - 5.0 g/dL   AST 34 15 - 41 U/L   ALT 44 0 - 44 U/L   Alkaline Phosphatase 87 38 - 126 U/L   Total Bilirubin 0.8 0.3 - 1.2 mg/dL   GFR calc non Af Amer 49 (L) >60 mL/min   GFR calc Af Amer 57 (L) >60 mL/min   Anion gap 17 (H) 5 - 15    Comment: Performed at [facility], [address] [place], [place] [address]  Ethanol     Status: None   Collection Time: 02/27/20  7:24 PM  Result Value Ref Range   Alcohol, Ethyl (B) <10 <10 mg/dL  Comment: (NOTE) Lowest detectable limit for serum alcohol is 10 mg/dL.  For medical purposes only. Performed at [facility], [address] [place], [place] [address]   Urine rapid drug screen (hosp performed)     Status: None   Collection Time: 02/27/20  7:24 PM  Result Value Ref Range   Opiates NONE DETECTED NONE DETECTED   Cocaine NONE DETECTED NONE DETECTED   Benzodiazepines NONE DETECTED NONE DETECTED   Amphetamines NONE DETECTED NONE DETECTED   Tetrahydrocannabinol NONE DETECTED NONE DETECTED   Barbiturates NONE DETECTED NONE DETECTED    Comment: (NOTE) DRUG SCREEN FOR MEDICAL PURPOSES ONLY.  IF CONFIRMATION IS NEEDED FOR ANY PURPOSE, NOTIFY LAB WITHIN 5 DAYS.  LOWEST DETECTABLE LIMITS FOR URINE DRUG SCREEN Drug Class                     Cutoff (ng/mL) Amphetamine and metabolites    1000 Barbiturate and metabolites    200 Benzodiazepine                 200 Tricyclics and metabolites     300 Opiates and metabolites        300 Cocaine and metabolites        300 THC                            50 Performed at [facility], [address] [place], [place] [address]   CBC with Diff     Status: Abnormal   Collection  Time: 02/27/20  7:24 PM  Result Value Ref Range   WBC 9.7 4.0 - 10.5 K/uL   RBC 5.47 (H) 3.87 - 5.11 MIL/uL   Hemoglobin 15.4 (H) 12.0 - 15.0 g/dL   HCT 47.4 36 - 46 %   MCV 80.3 80.0 - 100.0 fL   MCH 28.2 26.0 - 34.0 pg   MCHC 35.1 30.0 - 36.0 g/dL   RDW 25.9 (H) 56.3 - 87.5 %   Platelets 385 150 - 400 K/uL   nRBC 0.0 0.0 - 0.2 %   Neutrophils Relative % 72 %   Neutro Abs 7.0 1.7 - 7.7 K/uL   Lymphocytes Relative 21 %   Lymphs Abs 2.0 0.7 - 4.0 K/uL   Monocytes Relative 6 %   Monocytes Absolute 0.6 0 - 1 K/uL   Eosinophils Relative 0 %   Eosinophils Absolute 0.0 0 - 0 K/uL   Basophils Relative 0 %   Basophils Absolute 0.0 0 - 0 K/uL   Immature Granulocytes 1 %   Abs Immature Granulocytes 0.07 0.00 - 0.07 K/uL    Comment: Performed at [facility], [address] [place], [place] [address]  Lipase, blood     Status: Abnormal   Collection Time: 02/27/20  7:24 PM  Result Value Ref Range   Lipase 66 (H) 11 - 51 U/L    Comment: Performed at [facility], [address], [place], [place] [address]  Osmolality, urine     Status: None   Collection Time: 02/27/20  7:24 PM  Result Value Ref Range   Osmolality, Ur 451 300 - 900 mOsm/kg    Comment: PERFORMED AT [facility] Performed at [facility], [address], [place], [place] [address]   I-Stat beta hCG blood, ED     Status: None   Collection Time: 02/27/20  7:31 PM  Result Value  Ref Range   I-stat hCG, quantitative <5.0 <5 mIU/mL   Comment 3            Comment:   GEST. AGE      CONC.  (mIU/mL)   <=1 WEEK        5 - 50     2 WEEKS       50 - 500     3 WEEKS       100 - 10,000     4 WEEKS     1,000 - 30,000        FEMALE AND NON-PREGNANT FEMALE:     LESS THAN 5 mIU/mL   Na and K (sodium & potassium), rand urine     Status: None   Collection Time: 02/27/20  7:31 PM  Result Value Ref Range   Sodium, Ur <10 mmol/L   Potassium Urine 38 mmol/L    Comment: Performed at  [facility], [address], [place], [place] [address]  Osmolality     Status: Abnormal   Collection Time: 02/27/20 10:23 PM  Result Value Ref Range   Osmolality 258 (L) 275 - 295 mOsm/kg    Comment: PERFORMED AT [facility] Performed at [facility], [address], [place], [place] [address]   Basic metabolic panel     Status: Abnormal   Collection Time: 02/28/20 12:39 AM  Result Value Ref Range   Sodium 116 (LL) 135 - 145 mmol/L    Comment: CRITICAL RESULT CALLED TO, READ BACK BY AND VERIFIED WITH: Orlean Bradford, RN @ 0158 ON 02/28/20 C VARNER    Potassium 3.0 (L) 3.5 - 5.1 mmol/L   Chloride 67 (L) 98 - 111 mmol/L   CO2 31 22 - 32 mmol/L   Glucose, Bld 111 (H) 70 - 99 mg/dL    Comment: Glucose reference range applies only to samples taken after fasting for at least 8 hours.   BUN 48 (H) 6 - 20 mg/dL   Creatinine, Ser 0.98 (H) 0.44 - 1.00 mg/dL   Calcium 9.0 8.9 - 11.9 mg/dL   GFR calc non Af Amer 50 (L) >60 mL/min   GFR calc Af Amer 58 (L) >60 mL/min   Anion gap 18 (H) 5 - 15    Comment: Performed at [facility], [address] [place], [place] [address]  Hepatic function panel     Status: Abnormal   Collection Time: 02/28/20 12:39 AM  Result Value Ref Range   Total Protein 7.6 6.5 - 8.1 g/dL   Albumin 3.8 3.5 - 5.0 g/dL   AST 35 15 - 41 U/L   ALT 45 (H) 0 - 44 U/L   Alkaline Phosphatase 87 38 - 126 U/L   Total Bilirubin 0.7 0.3 - 1.2 mg/dL   Bilirubin, Direct 0.2 0.0 - 0.2 mg/dL   Indirect Bilirubin 0.5 0.3 - 0.9 mg/dL    Comment: Performed at [facility], [address] [place], [place] [address]  CBC WITH DIFFERENTIAL     Status: Abnormal   Collection Time: 02/28/20 12:39 AM  Result Value Ref Range   WBC 10.9 (H) 4.0 - 10.5 K/uL   RBC 5.31 (H) 3.87 - 5.11 MIL/uL   Hemoglobin 15.0 12.0 - 15.0 g/dL   HCT 30.8 36 - 46 %   MCV 80.4 80.0 - 100.0 fL   MCH 28.2 26.0 - 34.0 pg   MCHC 35.1 30.0 -  36.0 g/dL  RDW 15.7 (H) 11.5 - 15.5 %   Platelets 339 150 - 400 K/uL   nRBC 0.0 0.0 - 0.2 %   Neutrophils Relative % 76 %   Neutro Abs 8.2 (H) 1.7 - 7.7 K/uL   Lymphocytes Relative 17 %   Lymphs Abs 1.9 0.7 - 4.0 K/uL   Monocytes Relative 6 %   Monocytes Absolute 0.7 0 - 1 K/uL   Eosinophils Relative 0 %   Eosinophils Absolute 0.0 0 - 0 K/uL   Basophils Relative 0 %   Basophils Absolute 0.0 0 - 0 K/uL   Immature Granulocytes 1 %   Abs Immature Granulocytes 0.06 0.00 - 0.07 K/uL    Comment: Performed at [facility], [address] [place], [place] [address]  TSH     Status: None   Collection Time: 02/28/20 12:39 AM  Result Value Ref Range   TSH 0.861 0.350 - 4.500 uIU/mL    Comment: Performed by a 3rd Generation assay with a functional sensitivity of <=0.01 uIU/mL. Performed at [facility], [address], [place], [place] [address]   Cortisol     Status: None   Collection Time: 02/28/20 12:39 AM  Result Value Ref Range   Cortisol, Plasma 22.4 ug/dL    Comment: (NOTE) AM    6.7 - 22.6 ug/dL PM   <07.3       ug/dL Performed at [facility], [address] [place], [place] [address]   Uric acid     Status: Abnormal   Collection Time: 02/28/20 12:39 AM  Result Value Ref Range   Uric Acid, Serum 12.9 (H) 2.5 - 7.1 mg/dL    Comment: Performed at [facility], [address], [place], [place] [address]   CT RENAL STONE STUDY  Result Date: 02/27/2020 CLINICAL DATA:  Abdominal pain nausea vomiting EXAM: CT ABDOMEN AND PELVIS WITHOUT CONTRAST TECHNIQUE: Multidetector CT imaging of the abdomen and pelvis was performed following the standard protocol without IV contrast. COMPARISON:  None. FINDINGS: Lower chest: The visualized heart size within normal limits. No pericardial fluid/thickening. No hiatal hernia. The visualized portions of the lungs are clear. Hepatobiliary: Although limited due to the lack of intravenous  contrast, normal in appearance without gross focal abnormality. The patient is status post cholecystectomy. No biliary ductal dilation. Pancreas:  Unremarkable.  No surrounding inflammatory changes. Spleen: Normal in size. Although limited due to the lack of intravenous contrast, normal in appearance. Adrenals/Urinary Tract: Both adrenal glands appear normal. The kidneys and collecting system appear normal without evidence of urinary tract calculus or hydronephrosis. Bladder is unremarkable. Stomach/Bowel: The stomach is normal in appearance. There is moderately dilated jejunum and proximal ileum with air and fluid measuring up to 3.9 cm down to the level of the anterior umbilical hernia where there is a focal loop of ileum with surrounding fat stranding changes and possible wall thickening. The distal ileal loop exiting the hernia appears to be decompressed with the remainder of the distal ileum and terminal ileum appear to be decompressed. There is scattered colonic diverticula without diverticulitis. Vascular/Lymphatic: There are no enlarged abdominal or pelvic lymph nodes. Scattered aortic atherosclerotic calcifications are seen without aneurysmal dilatation. Reproductive: The uterus and adnexa are unremarkable. Other: No evidence of abdominal wall mass or hernia. Musculoskeletal: No acute or significant osseous findings. IMPRESSION: Proximal partial small bowel obstruction to the level of the anterior umbilical hernia with findings that could be suggestive of focal bowel incarceration. The  remainder of the distal ileal loops are decompressed. No renal or collecting system calculi. Aortic Atherosclerosis (ICD10-I70.0). Diverticulosis without diverticulitis. Electronically Signed   By: Prudencio Pair M.D.   On: 02/27/2020 23:50      Assessment/Plan Schizoaffective disorder -off medications Delusions/possible psychosis IVC last evening Severe hyponatremia Acute kidney disease Dehydration   Nausea and  vomiting with umbilical hernia by CT.  FEN: N.p.o./IV fluids removed by the patient ID: Zosyn 6/16 >> 1 dose DVT: None  Plan: She has received 1 dose of Haldol this AM.  She is pleasant and appears comfortable sitting in the chair.  She cannot really answer many questions, when you ask her anything she says she is fine and God will take care of everything.  She does not want to be examined.  She expects to go home very soon.  I have asked the nursing staff to give [place] a call when psychiatry comes up to see her and perhaps we can examine her together.  We will continue to follow closely.     Earnstine Regal [facility] Surgery 02/28/2020, 8:53 AM Please see Amion for pager number during day hours 7:00am-4:30pm

## 2020-02-28 NOTE — Progress Notes (Signed)
PROGRESS NOTE    Sally Wang  YJE:[id number] DOB: Apr 14, 1961 DOA: 02/27/2020 PCP: Patient, No Pcp Per   Brief Narrative:  HPI per Dr. Midge Minium on 02/27/2020 Sally Wang is a 59 y.o. female with history of hypertension and schizoaffective disorder admitted and discharged from behavioral health on 02/05/2020 and as per the husband since the discharge has not been taking any of her medications was brought to the ER the patient has been having persistent nausea vomiting for the last 1 week.  Not sure when was her last bowel movement.  Patient has not had any chest pain shortness of breath or any fever chills.  At times the vomitus is blood-tinged.  During last admission patient was Covid positive.  ED Course: In the ER patient appears confused and at times talking hyperreligious.  Patient is not allowing completed physical exam.  Labs are significant for sodium 116 potassium 3 creatinine 1.2 lipase 66 with hemoglobin of 15.4 Covid test was negative.  Drug screen is negative EKG shows sinus tachycardia.  **Interim History Patient remains extremely manic schizoaffective disorder she has been noncompliant with her medications.  Psychiatry is recommending considering Zyprexa Zydis 5 mg p.o. or IM twice daily and they recommend continuing one-to-one continuous observation monitoring and recommending psychiatric inpatient admission when she is medically cleared.  She did not let me examine her today.  Surgery was able to examine her briefly and they felt that her hernia could not be reduced and she will need surgical intervention if this still does not improve.  Assessment & Plan:   Principal Problem:   Hyponatremia Active Problems:   Schizoaffective disorder (HCC)   ARF (acute renal failure) (HCC)   Hypokalemia  Severe hyponatremia with hypokalemia and acute renal failure  -Suspect patient's hyponatremia could be from dehydration that patient has been having vomiting.   -Will check urine  studies including urine osmolality urine sodium check serum osmolality TSH and cortisol levels and will closely monitor basic metabolic panel.   -We will also check serum uric acid levels.   -C/w gently hydrating thinking that patient could be probably dehydrated given the acute renal failure with nausea vomiting. -Sodium is improving and potassium is still on the lower side but will be replete -Sodium is now 121 up from 116 potassium is 3.1 next-continue monitor repeat as necessary -Continue with normal saline +20 mEq of KCl at 75 mL's per hour not to overcorrect -Repeat BMPs every 4 and CMP in a.m.  Nausea and Vomiting -cause not clear but could be incarcerated hernia as the CT of the abdomen pelvis showed a proximal partial small bowel obstruction to the level of the anterior umbilical hernia findings that could be suggestive of focal bowel incarceration with the remainder of the distal ileal loops being decompressed -General surgery was consulted and recommending n.p.o. and fluid hydration and surgery in the morning if he is not improved  Hypertension  -Does not take any medication as per the patient has been medically patient.  IV hydralazine  Schizoaffective disorder  -Has not been taking any of her medications since last discharge on 02/05/2020.  -She is currently extremely manic and psychiatry been consulted.  She has been IVC then in four-point restraints now as she is moved multiple IVs. -Psychiatry recommends inpatient psychiatric hospitalization once she is medically cleared and improved  DVT prophylaxis: SCDs Code Status: FULL CODE  Family Communication: No family present at bedside  Disposition Plan: (specify when and where you expect patient  to be discharged). Include barriers to DC in this tab.  Status is: Inpatient  Remains inpatient appropriate because:Persistent severe electrolyte disturbances, Unsafe d/c plan, IV treatments appropriate due to intensity of illness or  inability to take PO and Inpatient level of care appropriate due to severity of illness   Dispo: The patient is from: Home              Anticipated d/c is to: [facility]              Anticipated d/c date is: 3 days              Patient currently is not medically stable to d/c.   Consultants:  General Surgery Psychiatry    Procedures: None   Antimicrobials:  Anti-infectives (From admission, onward)   Start     Dose/Rate Route Frequency Ordered Stop   02/28/20 0230  piperacillin-tazobactam (ZOSYN) IVPB 3.375 g     Discontinue     3.375 g 12.5 mL/hr over 240 Minutes Intravenous Every 8 hours 02/28/20 0201       Subjective: Seen at bedside and she would not let me examine her as she was extremely agitated and very manic.  She threw me out of the room and wanted to be left alone saying that there is nothing wrong with her.  States that she was better than I thought.  No other concerns or plans at the time and subsequently she ended up in four-point restraints given her significant agitation.  She ended up pulling out multiple IVs.  Objective: Vitals:   02/27/20 2234 02/28/20 0002 02/28/20 0548 02/28/20 1331  BP: 135/82 (!) 165/96 (!) 135/57 (!) 150/81  Pulse: (!) 109 (!) 57 (!) 105 100  Resp: 17 18 18 16   Temp:  98.3 F (36.8 C) 97.6 F (36.4 C) 97.6 F (36.4 C)  TempSrc:  Oral Oral Oral  SpO2: 97% 100% 98%   Weight:  100.5 kg    Height:  5\' 7"  (1.702 m)      Intake/Output Summary (Last 24 hours) at 02/28/2020 1834 Last data filed at 02/28/2020 1800 Gross per 24 hour  Intake 782.23 ml  Output --  Net 782.23 ml   Filed Weights   02/28/20 0002  Weight: 100.5 kg   Examination: Would not let me do a physical exam on her  Constitutional: Appears extremely manic and agitated Eyes: Lids and conjunctivae normal, sclerae anicteric  ENMT: External Ears, Nose appear normal. Grossly normal hearing. Neck: Appears normal, supple, no cervical masses, normal ROM, no appreciable  thyromegaly and no appreciable JVD Respiratory: Respirations unlabored and even Cardiovascular: Would not let me auscultate her heart; no visible edema noted Abdomen: Would not let me examine her abdomen GU: Deferred. Musculoskeletal: No apparent joint deformities noted Skin: No rashes, lesions, ulcers on limited visual examination.  Neurologic: She is extremely manic and does not want to participate in neurological examination Psychiatric: She has impaired judgment and insight.  She is extremely manic and tangential  Data Reviewed: I have personally reviewed following labs and imaging studies  CBC: Recent Labs  Lab 02/27/20 1924 02/28/20 0039  WBC 9.7 10.9*  NEUTROABS 7.0 8.2*  HGB 15.4* 15.0  HCT 43.9 42.7  MCV 80.3 80.4  PLT 385 397   Basic Metabolic Panel: Recent Labs  Lab 02/27/20 1924 02/28/20 0039 02/28/20 1454 02/28/20 1707  NA 116* 116* 121* 121*  K 3.0* 3.0* 3.2* 3.1*  CL 69* 67* 73* 74*  CO2 30 31  31 31  GLUCOSE 115* 111* 102* 98  BUN 45* 48* 43* 42*  CREATININE 1.22* 1.20* 1.05* 1.08*  CALCIUM 8.6* 9.0 8.9 8.7*   GFR: Estimated Creatinine Clearance: 69.2 mL/min (A) (by C-G formula based on SCr of 1.08 mg/dL (H)). Liver Function Tests: Recent Labs  Lab 02/27/20 1924 02/28/20 0039 02/28/20 1454  AST 34 35 35  ALT 44 45* 50*  ALKPHOS 87 87 87  BILITOT 0.8 0.7 0.9  PROT 7.9 7.6 7.5  ALBUMIN 3.9 3.8 3.7   Recent Labs  Lab 02/27/20 1924  LIPASE 66*   No results for input(s): AMMONIA in the last 168 hours. Coagulation Profile: No results for input(s): INR, PROTIME in the last 168 hours. Cardiac Enzymes: No results for input(s): CKTOTAL, CKMB, CKMBINDEX, TROPONINI in the last 168 hours. BNP (last 3 results) No results for input(s): PROBNP in the last 8760 hours. HbA1C: No results for input(s): HGBA1C in the last 72 hours. CBG: No results for input(s): GLUCAP in the last 168 hours. Lipid Profile: No results for input(s): CHOL, HDL, LDLCALC,  TRIG, CHOLHDL, LDLDIRECT in the last 72 hours. Thyroid Function Tests: Recent Labs    02/28/20 0039  TSH 0.861   Anemia Panel: No results for input(s): VITAMINB12, FOLATE, FERRITIN, TIBC, IRON, RETICCTPCT in the last 72 hours. Sepsis Labs: No results for input(s): PROCALCITON, LATICACIDVEN in the last 168 hours.  Recent Results (from the past 240 hour(s))  SARS Coronavirus 2 by RT PCR (hospital order, performed in [facility] hospital lab) Nasopharyngeal Nasopharyngeal Swab     Status: None   Collection Time: 02/27/20  7:24 PM   Specimen: Nasopharyngeal Swab  Result Value Ref Range Status   SARS Coronavirus 2 NEGATIVE NEGATIVE Final    Comment: (NOTE) SARS-CoV-2 target nucleic acids are NOT DETECTED.  The SARS-CoV-2 RNA is generally detectable in upper and lower respiratory specimens during the acute phase of infection. The lowest concentration of SARS-CoV-2 viral copies this assay can detect is 250 copies / mL. A negative result does not preclude SARS-CoV-2 infection and should not be used as the sole basis for treatment or other patient management decisions.  A negative result may occur with improper specimen collection / handling, submission of specimen other than nasopharyngeal swab, presence of viral mutation(s) within the areas targeted by this assay, and inadequate number of viral copies (<250 copies / mL). A negative result must be combined with clinical observations, patient history, and epidemiological information.  Fact Sheet for Patients:   BoilerBrush.com.cy  Fact Sheet for Healthcare Providers: https://pope.com/  This test is not yet approved or  cleared by the [place] FDA and has been authorized for detection and/or diagnosis of SARS-CoV-2 by FDA under an Emergency Use Authorization (EUA).  This EUA will remain in effect (meaning this test can be used) for the duration of the COVID-19 declaration under  Section 564(b)(1) of the Act, 21 U.S.C. section 360bbb-3(b)(1), unless the authorization is terminated or revoked sooner.  Performed at [facility], [address], [place], [place] [address]      RN Pressure Injury Documentation:     Estimated body mass index is 34.7 kg/m as calculated from the following:   Height as of this encounter: 5\' 7"  (1.702 m).   Weight as of this encounter: 100.5 kg.  Malnutrition Type:      Malnutrition Characteristics:      Nutrition Interventions:    Radiology Studies: CT RENAL STONE STUDY  Result Date: 02/27/2020 CLINICAL DATA:  Abdominal pain nausea vomiting EXAM: CT ABDOMEN AND PELVIS WITHOUT CONTRAST TECHNIQUE: Multidetector CT imaging of the abdomen and pelvis was performed following the standard protocol without IV contrast. COMPARISON:  None. FINDINGS: Lower chest: The visualized heart size within normal limits. No pericardial fluid/thickening. No hiatal hernia. The visualized portions of the lungs are clear. Hepatobiliary: Although limited due to the lack of intravenous contrast, normal in appearance without gross focal abnormality. The patient is status post cholecystectomy. No biliary ductal dilation. Pancreas:  Unremarkable.  No surrounding inflammatory changes. Spleen: Normal in size. Although limited due to the lack of intravenous contrast, normal in appearance. Adrenals/Urinary Tract: Both adrenal glands appear normal. The kidneys and collecting system appear normal without evidence of urinary tract calculus or hydronephrosis. Bladder is unremarkable. Stomach/Bowel: The stomach is normal in appearance. There is moderately dilated jejunum and proximal ileum with air and fluid measuring up to 3.9 cm down to the level of the anterior umbilical hernia where there is a focal loop of ileum with surrounding fat stranding changes and possible wall thickening. The distal ileal loop exiting the hernia appears to be decompressed  with the remainder of the distal ileum and terminal ileum appear to be decompressed. There is scattered colonic diverticula without diverticulitis. Vascular/Lymphatic: There are no enlarged abdominal or pelvic lymph nodes. Scattered aortic atherosclerotic calcifications are seen without aneurysmal dilatation. Reproductive: The uterus and adnexa are unremarkable. Other: No evidence of abdominal wall mass or hernia. Musculoskeletal: No acute or significant osseous findings. IMPRESSION: Proximal partial small bowel obstruction to the level of the anterior umbilical hernia with findings that could be suggestive of focal bowel incarceration. The remainder of the distal ileal loops are decompressed. No renal or collecting system calculi. Aortic Atherosclerosis (ICD10-I70.0). Diverticulosis without diverticulitis. Electronically Signed   By: Jonna Clark M.D.   On: 02/27/2020 23:50   Scheduled Meds: Continuous Infusions: . 0.9 % NaCl with KCl 20 mEq / L 75 mL/hr at 02/28/20 1713  . piperacillin-tazobactam (ZOSYN)  IV 3.375 g (02/28/20 1314)     LOS: 0 days   Merlene Laughter, DO [facility] PAGER is on AMION  If 7PM-7AM, please contact night-coverage [email or web address]

## 2020-02-29 ENCOUNTER — Inpatient Hospital Stay (HOSPITAL_COMMUNITY): Payer: Medicare HMO | Admitting: Anesthesiology

## 2020-02-29 ENCOUNTER — Encounter (HOSPITAL_COMMUNITY)
Admission: EM | Disposition: A | Discharge status: Home / Self Care | Payer: Self-pay | Source: Home / Self Care | Attending: Internal Medicine

## 2020-02-29 ENCOUNTER — Encounter (HOSPITAL_COMMUNITY): Payer: Self-pay | Admitting: Internal Medicine

## 2020-02-29 DIAGNOSIS — K450 Other specified abdominal hernia with obstruction, without gangrene: Secondary | ICD-10-CM

## 2020-02-29 DIAGNOSIS — F309 Manic episode, unspecified: Secondary | ICD-10-CM

## 2020-02-29 DIAGNOSIS — N1831 Chronic kidney disease, stage 3a: Secondary | ICD-10-CM

## 2020-02-29 HISTORY — PX: UMBILICAL HERNIA REPAIR: SHX196

## 2020-02-29 LAB — COMPREHENSIVE METABOLIC PANEL
ALT: 48 U/L — ABNORMAL HIGH (ref 0–44)
AST: 28 U/L (ref 15–41)
Albumin: 3.3 g/dL — ABNORMAL LOW (ref 3.5–5.0)
Alkaline Phosphatase: 84 U/L (ref 38–126)
Anion gap: 14 (ref 5–15)
BUN: 29 mg/dL — ABNORMAL HIGH (ref 6–20)
CO2: 31 mmol/L (ref 22–32)
Calcium: 8.9 mg/dL (ref 8.9–10.3)
Chloride: 82 mmol/L — ABNORMAL LOW (ref 98–111)
Creatinine, Ser: 1.17 mg/dL — ABNORMAL HIGH (ref 0.44–1.00)
GFR calc Af Amer: 59 mL/min — ABNORMAL LOW (ref >60)
GFR calc non Af Amer: 51 mL/min — ABNORMAL LOW (ref >60)
Glucose, Bld: 93 mg/dL (ref 70–99)
Potassium: 3.2 mmol/L — ABNORMAL LOW (ref 3.5–5.1)
Sodium: 127 mmol/L — ABNORMAL LOW (ref 135–145)
Total Bilirubin: 1 mg/dL (ref 0.3–1.2)
Total Protein: 6.9 g/dL (ref 6.5–8.1)

## 2020-02-29 LAB — CBC WITH DIFFERENTIAL/PLATELET
Abs Immature Granulocytes: 0.03 10*3/uL (ref 0.00–0.07)
Basophils Absolute: 0 10*3/uL (ref 0.0–0.1)
Basophils Relative: 0 %
Eosinophils Absolute: 0 10*3/uL (ref 0.0–0.5)
Eosinophils Relative: 0 %
HCT: 43.5 % (ref 36.0–46.0)
Hemoglobin: 14.8 g/dL (ref 12.0–15.0)
Immature Granulocytes: 1 %
Lymphocytes Relative: 25 %
Lymphs Abs: 1.6 10*3/uL (ref 0.7–4.0)
MCH: 28 pg (ref 26.0–34.0)
MCHC: 34 g/dL (ref 30.0–36.0)
MCV: 82.4 fL (ref 80.0–100.0)
Monocytes Absolute: 0.5 10*3/uL (ref 0.1–1.0)
Monocytes Relative: 8 %
Neutro Abs: 4.3 10*3/uL (ref 1.7–7.7)
Neutrophils Relative %: 66 %
Platelets: 308 10*3/uL (ref 150–400)
RBC: 5.28 MIL/uL — ABNORMAL HIGH (ref 3.87–5.11)
RDW: 16.4 % — ABNORMAL HIGH (ref 11.5–15.5)
WBC: 6.5 10*3/uL (ref 4.0–10.5)
nRBC: 0 % (ref 0.0–0.2)

## 2020-02-29 LAB — BASIC METABOLIC PANEL
Anion gap: 17 — ABNORMAL HIGH (ref 5–15)
BUN: 34 mg/dL — ABNORMAL HIGH (ref 6–20)
CO2: 27 mmol/L (ref 22–32)
Calcium: 8.5 mg/dL — ABNORMAL LOW (ref 8.9–10.3)
Chloride: 77 mmol/L — ABNORMAL LOW (ref 98–111)
Creatinine, Ser: 1.07 mg/dL — ABNORMAL HIGH (ref 0.44–1.00)
GFR calc Af Amer: >60 mL/min (ref >60)
GFR calc non Af Amer: 57 mL/min — ABNORMAL LOW (ref >60)
Glucose, Bld: 92 mg/dL (ref 70–99)
Potassium: 3.1 mmol/L — ABNORMAL LOW (ref 3.5–5.1)
Sodium: 121 mmol/L — ABNORMAL LOW (ref 135–145)

## 2020-02-29 LAB — MAGNESIUM: Magnesium: 2.5 mg/dL — ABNORMAL HIGH (ref 1.7–2.4)

## 2020-02-29 LAB — PHOSPHORUS: Phosphorus: 3.6 mg/dL (ref 2.5–4.6)

## 2020-02-29 SURGERY — REPAIR, HERNIA, UMBILICAL, ADULT
Anesthesia: General

## 2020-02-29 MED ORDER — POTASSIUM CHLORIDE 10 MEQ/100ML IV SOLN
10.0000 meq | INTRAVENOUS | Status: AC
  Administered 2020-02-29 (×4): 10 meq via INTRAVENOUS
  Filled 2020-02-29 (×4): qty 100

## 2020-02-29 MED ORDER — ROCURONIUM BROMIDE 10 MG/ML (PF) SYRINGE
PREFILLED_SYRINGE | INTRAVENOUS | Status: AC
  Filled 2020-02-29: qty 30

## 2020-02-29 MED ORDER — LIDOCAINE 2% (20 MG/ML) 5 ML SYRINGE
INTRAMUSCULAR | Status: DC | PRN
  Administered 2020-02-29: 40 mg via INTRAVENOUS

## 2020-02-29 MED ORDER — BUPIVACAINE HCL (PF) 0.5 % IJ SOLN
INTRAMUSCULAR | Status: DC | PRN
  Administered 2020-02-29: 20 mL

## 2020-02-29 MED ORDER — ONDANSETRON HCL 4 MG/2ML IJ SOLN
INTRAMUSCULAR | Status: AC
  Filled 2020-02-29: qty 2

## 2020-02-29 MED ORDER — SUCCINYLCHOLINE CHLORIDE 200 MG/10ML IV SOSY
PREFILLED_SYRINGE | INTRAVENOUS | Status: AC
  Filled 2020-02-29: qty 10

## 2020-02-29 MED ORDER — DEXAMETHASONE SODIUM PHOSPHATE 10 MG/ML IJ SOLN
INTRAMUSCULAR | Status: DC | PRN
  Administered 2020-02-29: 5 mg via INTRAVENOUS

## 2020-02-29 MED ORDER — PROPOFOL 10 MG/ML IV BOLUS
INTRAVENOUS | Status: DC | PRN
  Administered 2020-02-29: 120 mg via INTRAVENOUS

## 2020-02-29 MED ORDER — ALBUMIN HUMAN 5 % IV SOLN: INTRAVENOUS | Status: DC | PRN

## 2020-02-29 MED ORDER — POTASSIUM CHLORIDE IN NACL 20-0.9 MEQ/L-% IV SOLN
INTRAVENOUS | Status: AC
  Filled 2020-02-29 (×2): qty 1000

## 2020-02-29 MED ORDER — MORPHINE SULFATE (PF) 2 MG/ML IV SOLN: 1.0000 mg | INTRAVENOUS | Status: DC | PRN

## 2020-02-29 MED ORDER — ONDANSETRON HCL 4 MG/2ML IJ SOLN
INTRAMUSCULAR | Status: DC | PRN
  Administered 2020-02-29: 4 mg via INTRAVENOUS

## 2020-02-29 MED ORDER — SUGAMMADEX SODIUM 200 MG/2ML IV SOLN
INTRAVENOUS | Status: DC | PRN
  Administered 2020-02-29: 200 mg via INTRAVENOUS

## 2020-02-29 MED ORDER — POTASSIUM CHLORIDE 10 MEQ/100ML IV SOLN
INTRAVENOUS | Status: AC
  Filled 2020-02-29: qty 100

## 2020-02-29 MED ORDER — MIDAZOLAM HCL 2 MG/2ML IJ SOLN
INTRAMUSCULAR | Status: AC
  Filled 2020-02-29: qty 2

## 2020-02-29 MED ORDER — 0.9 % SODIUM CHLORIDE (POUR BTL) OPTIME
TOPICAL | Status: DC | PRN
  Administered 2020-02-29: 1000 mL

## 2020-02-29 MED ORDER — ALBUMIN HUMAN 5 % IV SOLN
INTRAVENOUS | Status: AC
  Filled 2020-02-29: qty 250

## 2020-02-29 MED ORDER — PROPOFOL 10 MG/ML IV BOLUS
INTRAVENOUS | Status: AC
  Filled 2020-02-29: qty 20

## 2020-02-29 MED ORDER — MIDAZOLAM HCL 5 MG/5ML IJ SOLN
INTRAMUSCULAR | Status: DC | PRN
  Administered 2020-02-29: 2 mg via INTRAVENOUS

## 2020-02-29 MED ORDER — FENTANYL CITRATE (PF) 100 MCG/2ML IJ SOLN
INTRAMUSCULAR | Status: DC | PRN
  Administered 2020-02-29 (×5): 50 ug via INTRAVENOUS

## 2020-02-29 MED ORDER — ROCURONIUM BROMIDE 10 MG/ML (PF) SYRINGE
PREFILLED_SYRINGE | INTRAVENOUS | Status: DC | PRN
  Administered 2020-02-29 (×2): 10 mg via INTRAVENOUS
  Administered 2020-02-29: 30 mg via INTRAVENOUS

## 2020-02-29 MED ORDER — LACTATED RINGERS IV SOLN: INTRAVENOUS | Status: DC | PRN

## 2020-02-29 MED ORDER — BUPIVACAINE HCL (PF) 0.5 % IJ SOLN
INTRAMUSCULAR | Status: AC
  Filled 2020-02-29: qty 30

## 2020-02-29 MED ORDER — SUCCINYLCHOLINE CHLORIDE 20 MG/ML IJ SOLN
INTRAMUSCULAR | Status: DC | PRN
  Administered 2020-02-29: 140 mg via INTRAVENOUS

## 2020-02-29 MED ORDER — FENTANYL CITRATE (PF) 250 MCG/5ML IJ SOLN
INTRAMUSCULAR | Status: AC
  Filled 2020-02-29: qty 5

## 2020-02-29 MED ORDER — FENTANYL CITRATE (PF) 100 MCG/2ML IJ SOLN: 25.0000 ug | INTRAMUSCULAR | Status: DC | PRN

## 2020-02-29 MED ORDER — POTASSIUM CHLORIDE IN NACL 20-0.9 MEQ/L-% IV SOLN: INTRAVENOUS | Status: DC

## 2020-02-29 SURGICAL SUPPLY — 32 items
ADH SKN CLS APL DERMABOND .7 (GAUZE/BANDAGES/DRESSINGS) ×1
APL PRP STRL LF DISP 70% ISPRP (MISCELLANEOUS) ×1
BINDER ABDOMINAL 12 ML 46-62 (SOFTGOODS) IMPLANT
BLADE SURG 15 STRL LF DISP TIS (BLADE) ×1 IMPLANT
BLADE SURG 15 STRL SS (BLADE) ×3
CHLORAPREP W/TINT 26 (MISCELLANEOUS) ×3 IMPLANT
COVER WAND RF STERILE (DRAPES) IMPLANT
DECANTER SPIKE VIAL GLASS SM (MISCELLANEOUS) IMPLANT
DERMABOND ADVANCED (GAUZE/BANDAGES/DRESSINGS) ×2
DERMABOND ADVANCED .7 DNX12 (GAUZE/BANDAGES/DRESSINGS) ×1 IMPLANT
DRAPE LAPAROSCOPIC ABDOMINAL (DRAPES) ×3 IMPLANT
ELECT REM PT RETURN 15FT ADLT (MISCELLANEOUS) ×3 IMPLANT
GAUZE SPONGE 4X4 12PLY STRL (GAUZE/BANDAGES/DRESSINGS) ×4 IMPLANT
GOWN STRL REUS W/TWL XL LVL3 (GOWN DISPOSABLE) ×6 IMPLANT
KIT BASIN (CUSTOM PROCEDURE TRAY) ×3 IMPLANT
KIT TURNOVER KIT A (KITS) IMPLANT
NEEDLE HYPO 22GX1.5 SAFETY (NEEDLE) ×3 IMPLANT
PACK BASIC VI WITH GOWN DISP (CUSTOM PROCEDURE TRAY) ×3 IMPLANT
PENCIL SMOKE EVACUATOR (MISCELLANEOUS) IMPLANT
SPONGE LAP 18X18 RF (DISPOSABLE) ×3 IMPLANT
STAPLER VISISTAT 35W (STAPLE) ×2 IMPLANT
SUT MNCRL AB 4-0 PS2 18 (SUTURE) ×3 IMPLANT
SUT NOVA NAB DX-16 0-1 5-0 T12 (SUTURE) ×3 IMPLANT
SUT VIC AB 3-0 SH 27 (SUTURE) ×3
SUT VIC AB 3-0 SH 27X BRD (SUTURE) ×1 IMPLANT
SUT VICRYL 2 0 18  UND BR (SUTURE)
SUT VICRYL 2 0 18 UND BR (SUTURE) IMPLANT
SYR 20ML LL LF (SYRINGE) ×3 IMPLANT
SYR BULB IRRIG 60ML STRL (SYRINGE) ×2 IMPLANT
TAPE CLOTH SURG 4X10 WHT LF (GAUZE/BANDAGES/DRESSINGS) ×2 IMPLANT
TOWEL OR 17X26 10 PK STRL BLUE (TOWEL DISPOSABLE) ×3 IMPLANT
TOWEL OR NON WOVEN STRL DISP B (DISPOSABLE) ×3 IMPLANT

## 2020-02-29 NOTE — Op Note (Addendum)
   Sally Wang 02/29/2020   Pre-op Diagnosis: Incarcerated umbilical hernia     Post-op Diagnosis: same  Procedure(s): Repair of incarcerated umbilical hernia  Surgeon(s): Abigail Miyamoto, MD  Anesthesia: General  Staff:  Circulator: Lyman Desanctis, RN Relief Circulator: Swygert, Sylvester Harder, RN Scrub Person: Kesler, Willey Blade, Dorathy Daft M  Estimated Blood Loss: Minimal               Indications: This is a 59 year old female with schizophrenia who has been involuntarily committed.  She has a small bowel obstruction from an incarcerated hernia just above the umbilicus.  Surgery was strongly recommended to the family to prevent ischemia or infarction of the bowel in the hernia which could not be reduced.  Findings: The patient was found to have omentum and small bowel incarcerated in the hernia.  It was dusky but not ischemic.  It was fairly thickened.  I was able to milk proximal small bowel contents through this area.  The decision was made to forego any resection.  The fascial defect was repaired primarily  Procedure: The patient was brought to the operating room identifies correct patient.  She is placed upon the operating table general anesthesia was induced.  Her abdomen was prepped and draped in the usual sterile fashion.  I made a vertical incision over the top of the palpable hernia above the umbilicus.  I took this down to the hernia sac and fascia.  I opened up the hernia sac and evaluate the contents which contained small bowel and omentum.  The small bowel was dusky.  I opened up the fascia further was able to eviscerate small bowel proximal and distal to this area.  Once this was done the bowel appeared viable.  I was able to milk contents through the area.  The decision was made to then reduce the area back into the abdominal cavity.  I excised the redundant sac.  I then elected to repair the hernia primarily as there have been a lot of fluid in the hernia sac and the area  appeared inflamed.  I was able to close the fascial defect with several interrupted and figure-of-eight #1 Novafil sutures.  I anesthetized the fascia further with Marcaine.  I then irrigated the the wound with saline and closed the incision with staples.  Gauze and tape were applied.  She tolerated the procedure well.  All the counts were correct at the end of the procedure.  The patient was then extubated in the operating room and taken in stable addition to the recovery room.          Abigail Miyamoto   Date: 02/29/2020  Time: 7:41 PM

## 2020-02-29 NOTE — Progress Notes (Signed)
Received from [facility] at 2100  Resting quietly at present

## 2020-02-29 NOTE — Progress Notes (Signed)
   02/29/20 2106  Assess: MEWS Score  Temp 98.3 F (36.8 C)  BP (!) 171/109  Pulse Rate (!) 117  SpO2 100 %  Assess: MEWS Score  MEWS Temp 0  MEWS Systolic 0  MEWS Pulse 2  MEWS RR 0  MEWS LOC 1  MEWS Score 3  MEWS Score Color Yellow  Assess: if the MEWS score is Yellow or Red  Were vital signs taken at a resting state? Yes  Focused Assessment Documented focused assessment  Early Detection of Sepsis Score *See Row Information* Low  MEWS guidelines implemented *See Row Information* Yes  Take Vital Signs  Increase Vital Sign Frequency  Yellow: Q 2hr X 2 then Q 4hr X 2, if remains yellow, continue Q 4hrs  Escalate  MEWS: Escalate Yellow: discuss with charge nurse/RN and consider discussing with provider and RRT  Notify: Charge Nurse/RN  Name of Charge Nurse/RN Notified Debbie  Date Charge Nurse/RN Notified 02/29/20  Time Charge Nurse/RN Notified 2124  Notify: Provider  Provider Name/Title Duwayne Heck)  Date Provider Notified 02/29/20  Time Provider Notified 2124  Notification Type Page  Notification Reason Change in status  Response Other (Comment) (hydrazaline 10mg  adm)  Date of Provider Response  (pending)

## 2020-02-29 NOTE — Progress Notes (Signed)
PROGRESS NOTE    Sally Wang  WIO:[id number] DOB: 1961/04/15 DOA: 02/27/2020 PCP: Patient, No Pcp Per   Brief Narrative:  HPI per Dr. Midge Minium on 02/27/2020 Sally Wang is a 59 y.o. female with history of hypertension and schizoaffective disorder admitted and discharged from behavioral health on 02/05/2020 and as per the husband since the discharge has not been taking any of her medications was brought to the ER the patient has been having persistent nausea vomiting for the last 1 week.  Not sure when was her last bowel movement.  Patient has not had any chest pain shortness of breath or any fever chills.  At times the vomitus is blood-tinged.  During last admission patient was Covid positive.  ED Course: In the ER patient appears confused and at times talking hyperreligious.  Patient is not allowing completed physical exam.  Labs are significant for sodium 116 potassium 3 creatinine 1.2 lipase 66 with hemoglobin of 15.4 Covid test was negative.  Drug screen is negative EKG shows sinus tachycardia.  **Interim History Patient remained extremely manic schizoaffective disorder she has been noncompliant with her medications.  Psychiatry was consulted andrecommending considering Zyprexa Zydis 5 mg p.o. or IM twice daily and they recommend continuing one-to-one continuous observation monitoring and recommending psychiatric inpatient admission when she is medically cleared.  She did not let me examine her yesterday but today she let me.  Surgery evaluated and feels that she has incarcerated  hernia containing bowel that cannot be reduced.  She is at risk for bowel strangulation and eventual perforation without surgery and they discussed with the family and they understand and wish to proceed with the surgery which will be done today.    Patient sodium is improving slowly however she remained hyperreligious and extremely manic today in four-point restraints.  Assessment & Plan:   Principal  Problem:   Hyponatremia Active Problems:   Schizoaffective disorder (HCC)   ARF (acute renal failure) (HCC)   Hypokalemia  Severe hyponatremia with hypokalemia and CKD stage III in the setting of significant dehydration, improving slowly -Suspect patient's hyponatremia could be from dehydration that patient has been having vomiting.   -Will check urine studies including urine osmolality urine sodium check serum osmolality TSH and cortisol levels and will closely monitor basic metabolic panel.   -We will also check serum uric acid levels.   -C/w gently hydrating thinking that patient could be probably dehydrated given the acute renal failure with nausea vomiting. -Sodium is improving and potassium is still on the lower side but will be replete -Sodium is now 127 up from 116 -Potassium was 3.2 today -continue monitor repeat as necessary -Continue with normal saline +20 mEq of KCl at 75 mL's per hour but will reduce the rate to 50 mils per hour and avoidance of not overcorrect too quickly -Repeat BMPs every 4 and CMP in a.m.  CKD stage IIIa -Patient's BUNs/creatinine has been relatively stable despite getting IV fluid hydration which leads me to believe that she does have some evidence of renal dysfunction Patient BUN/creatinine is now 29/1.17 and is trended down from 48/1.20 on admission -Avoid further nephrotoxic medications, contrast dyes, hypotension and renally dose medications Repeat CMP in a.m.  Hyponatremia/Hypochloremia -Slowly improving and sodium is gone from 116 on admission is now 8 -Patient is quite level has gone from 6.7 is now 82  Elevated ALT -Patient's ALT has been mildly elevated and peaked at 50 and today it is 48 next-on presentation it was  45 -Continue monitor and trend and if continues to persistently be elevated will obtain a right upper quadrant ultrasound as well as a acute hepatitis panel  -Repeat CMP in a.m.   Leukocytosis -Patient's WBC went from 10.9  is now 6.5 -Continue to monitor for signs and symptoms of infection; likely in the setting of her incarcerated hernia -Repeat CBC in a.m.  Hypokalemia -See above  -Patient's potassium was 3.2 today -Replete with IV KCl 40 mEq as well as normal saline with 20 mEq of KCl continuous -Magnesium level was 2.5  -Continue to monitor and replete as necessary -Repeat CMP in the a.m.  Nausea and Vomiting -cause not clear but likley be incarcerated hernia as the CT of the abdomen pelvis showed a proximal partial small bowel obstruction to the level of the anterior umbilical hernia findings that could be suggestive of focal bowel incarceration with the remainder of the distal ileal loops being decompressed -General surgery was consulted and recommending n.p.o. and fluid hydration -Because she continues to have an incarcerated hernia containing bowel which can her be reduced she is at risk for bowel strangulation and omental perforation without surgery -She has been IVC but did not have the medical decision mental capacity to make decisions about surgery still surgery is reached out to the family and they have agreed and the family is going to understand the patient needs surgery and she is being posted for later this afternoon  Hypertension  -Does not take any medication as per the patient has been medically patient.  IV hydralazine  Schizoaffective disorder  -Has not been taking any of her medications since last discharge on 02/05/2020.  -She is currently extremely manic and psychiatry been consulted.  She has been IVC then in four-point restraints now as she is moved multiple IVs. -Psychiatry recommends inpatient psychiatric hospitalization once she is medically cleared and improved and after she had her surgery  Obesity -Estimated body mass index is 34.6 kg/m as calculated from the following:   Height as of this encounter: 5\' 7"  (1.702 m).   Weight as of this encounter: 100.2 kg. -Continued  Weight Loss and Dietary Counseling   DVT prophylaxis: SCDs Code Status: FULL CODE  Family Communication: No family present at bedside  Disposition Plan: Remain inpatient given her incarcerated hernia containing bowel that needs surgical intervention  Status is: Inpatient  Remains inpatient appropriate because:Persistent severe electrolyte disturbances, Unsafe d/c plan, IV treatments appropriate due to intensity of illness or inability to take PO and Inpatient level of care appropriate due to severity of illness   Dispo: The patient is from: Home              Anticipated d/c is to: [facility]              Anticipated d/c date is: 3 days              Patient currently is not medically stable to d/c.   Consultants:  General Surgery Psychiatry    Procedures: None   Antimicrobials:  Anti-infectives (From admission, onward)   Start     Dose/Rate Route Frequency Ordered Stop   02/28/20 0230  piperacillin-tazobactam (ZOSYN) IVPB 3.375 g     Discontinue     3.375 g 12.5 mL/hr over 240 Minutes Intravenous Every 8 hours 02/28/20 0201       Subjective: Seen at bedside and she is extremely manic this morning and in four-point restraints and she is talking about God and is very  hyper religious.  Has some tenderness when palpating on her abdomen.  Was more agitated yesterday but appears little bit calmer today but still extremely psychotic.  No other concerns or complaints this time.  Objective: Vitals:   02/28/20 0548 02/28/20 1331 02/28/20 2016 02/29/20 0500  BP: (!) 135/57 (!) 150/81 (!) 136/113   Pulse: (!) 105 100 100   Resp: 18 16 19    Temp: 97.6 F (36.4 C) 97.6 F (36.4 C) (!) 97.3 F (36.3 C)   TempSrc: Oral Oral Oral   SpO2: 98%  100%   Weight:    100.2 kg  Height:        Intake/Output Summary (Last 24 hours) at 02/29/2020 1249 Last data filed at 02/29/2020 1200 Gross per 24 hour  Intake 558.81 ml  Output 1325 ml  Net -766.19 ml   Filed Weights   02/28/20 0002 02/29/20  0500  Weight: 100.5 kg 100.2 kg   Examination: Physical Exam:  Constitutional: WN/WD obese African-American female who is in four-point restraints currently in extremely manic and psychotic and is extremely hyperreligious Eyes: Lids and conjunctivae normal, sclerae anicteric  ENMT: External Ears, Nose appear normal. Grossly normal hearing. Neck: Appears normal, supple, no cervical masses, normal ROM, no appreciable thyromegaly neck: No JVD Respiratory: Diminished to auscultation bilaterally, no wheezing, rales, rhonchi or crackles. Normal respiratory effort and patient is not tachypenic. No accessory muscle use.  Unlabored breathing Cardiovascular: RRR, no murmurs / rubs / gallops. S1 and S2 auscultated. No extremity edema.  Abdomen: Soft, tender to palpate around the umbilicus, distended secondary to body habitus. Bowel sounds positive.  GU: Deferred. Musculoskeletal: No clubbing / cyanosis of digits/nails. No joint deformity upper and lower extremities.  Skin: No rashes, lesions, ulcers on limited skin evaluation. No induration; Warm and dry.  Neurologic: CN 2-12 grossly intact with no focal deficits.  Romberg sign and cerebellar reflexes not assessed Psychiatric: Impaired judgment and insight.  She is extremely manic and having psychotic features.    Data Reviewed: I have personally reviewed following labs and imaging studies  CBC: Recent Labs  Lab 02/27/20 1924 02/28/20 0039 02/29/20 0829  WBC 9.7 10.9* 6.5  NEUTROABS 7.0 8.2* 4.3  HGB 15.4* 15.0 14.8  HCT 43.9 42.7 43.5  MCV 80.3 80.4 82.4  PLT 385 339 106   Basic Metabolic Panel: Recent Labs  Lab 02/28/20 1454 02/28/20 1707 02/28/20 1928 02/29/20 0121 02/29/20 0829  NA 121* 121* 121* 121* 127*  K 3.2* 3.1* 3.2* 3.1* 3.2*  CL 73* 74* 75* 77* 82*  CO2 31 31 30 27 31   GLUCOSE 102* 98 98 92 93  BUN 43* 42* 40* 34* 29*  CREATININE 1.05* 1.08* 1.12* 1.07* 1.17*  CALCIUM 8.9 8.7* 8.8* 8.5* 8.9  MG  --   --   --   --   2.5*  PHOS  --   --   --   --  3.6   GFR: Estimated Creatinine Clearance: 63.7 mL/min (A) (by C-G formula based on SCr of 1.17 mg/dL (H)). Liver Function Tests: Recent Labs  Lab 02/27/20 1924 02/28/20 0039 02/28/20 1454 02/28/20 1928 02/29/20 0829  AST 34 35 35 35 28  ALT 44 45* 50* 49* 48*  ALKPHOS 87 87 87 83 84  BILITOT 0.8 0.7 0.9 1.0 1.0  PROT 7.9 7.6 7.5 7.3 6.9  ALBUMIN 3.9 3.8 3.7 3.6 3.3*   Recent Labs  Lab 02/27/20 1924  LIPASE 66*   No results for input(s): AMMONIA in the  last 168 hours. Coagulation Profile: No results for input(s): INR, PROTIME in the last 168 hours. Cardiac Enzymes: No results for input(s): CKTOTAL, CKMB, CKMBINDEX, TROPONINI in the last 168 hours. BNP (last 3 results) No results for input(s): PROBNP in the last 8760 hours. HbA1C: No results for input(s): HGBA1C in the last 72 hours. CBG: No results for input(s): GLUCAP in the last 168 hours. Lipid Profile: No results for input(s): CHOL, HDL, LDLCALC, TRIG, CHOLHDL, LDLDIRECT in the last 72 hours. Thyroid Function Tests: Recent Labs    02/28/20 0039  TSH 0.861   Anemia Panel: No results for input(s): VITAMINB12, FOLATE, FERRITIN, TIBC, IRON, RETICCTPCT in the last 72 hours. Sepsis Labs: No results for input(s): PROCALCITON, LATICACIDVEN in the last 168 hours.  Recent Results (from the past 240 hour(s))  SARS Coronavirus 2 by RT PCR (hospital order, performed in [facility] lab) Nasopharyngeal Nasopharyngeal Swab     Status: None   Collection Time: 02/27/20  7:24 PM   Specimen: Nasopharyngeal Swab  Result Value Ref Range Status   SARS Coronavirus 2 NEGATIVE NEGATIVE Final    Comment: (NOTE) SARS-CoV-2 target nucleic acids are NOT DETECTED.  The SARS-CoV-2 RNA is generally detectable in upper and lower respiratory specimens during the acute phase of infection. The lowest concentration of SARS-CoV-2 viral copies this assay can detect is 250 copies / mL. A negative result  does not preclude SARS-CoV-2 infection and should not be used as the sole basis for treatment or other patient management decisions.  A negative result may occur with improper specimen collection / handling, submission of specimen other than nasopharyngeal swab, presence of viral mutation(s) within the areas targeted by this assay, and inadequate number of viral copies (<250 copies / mL). A negative result must be combined with clinical observations, patient history, and epidemiological information.  Fact Sheet for Patients:   BoilerBrush.com.cyhttps://www.fda.gov/media/136312/download  Fact Sheet for Healthcare Providers: https://pope.com/https://www.fda.gov/media/136313/download  This test is not yet approved or  cleared by the [place] FDA and has been authorized for detection and/or diagnosis of SARS-CoV-2 by FDA under an Emergency Use Authorization (EUA).  This EUA will remain in effect (meaning this test can be used) for the duration of the COVID-19 declaration under Section 564(b)(1) of the Act, 21 U.S.C. section 360bbb-3(b)(1), unless the authorization is terminated or revoked sooner.  Performed at [facility], [address] [place], [place] [address]      RN Pressure Injury Documentation:     Estimated body mass index is 34.6 kg/m as calculated from the following:   Height as of this encounter: 5\' 7"  (1.702 m).   Weight as of this encounter: 100.2 kg.  Malnutrition Type:      Malnutrition Characteristics:      Nutrition Interventions:    Radiology Studies: CT RENAL STONE STUDY  Result Date: 02/27/2020 CLINICAL DATA:  Abdominal pain nausea vomiting EXAM: CT ABDOMEN AND PELVIS WITHOUT CONTRAST TECHNIQUE: Multidetector CT imaging of the abdomen and pelvis was performed following the standard protocol without IV contrast. COMPARISON:  None. FINDINGS: Lower chest: The visualized heart size within normal limits. No pericardial fluid/thickening. No hiatal hernia. The  visualized portions of the lungs are clear. Hepatobiliary: Although limited due to the lack of intravenous contrast, normal in appearance without gross focal abnormality. The patient is status post cholecystectomy. No biliary ductal dilation. Pancreas:  Unremarkable.  No surrounding inflammatory changes. Spleen: Normal in size. Although limited due to the lack of intravenous contrast, normal in appearance.  Adrenals/Urinary Tract: Both adrenal glands appear normal. The kidneys and collecting system appear normal without evidence of urinary tract calculus or hydronephrosis. Bladder is unremarkable. Stomach/Bowel: The stomach is normal in appearance. There is moderately dilated jejunum and proximal ileum with air and fluid measuring up to 3.9 cm down to the level of the anterior umbilical hernia where there is a focal loop of ileum with surrounding fat stranding changes and possible wall thickening. The distal ileal loop exiting the hernia appears to be decompressed with the remainder of the distal ileum and terminal ileum appear to be decompressed. There is scattered colonic diverticula without diverticulitis. Vascular/Lymphatic: There are no enlarged abdominal or pelvic lymph nodes. Scattered aortic atherosclerotic calcifications are seen without aneurysmal dilatation. Reproductive: The uterus and adnexa are unremarkable. Other: No evidence of abdominal wall mass or hernia. Musculoskeletal: No acute or significant osseous findings. IMPRESSION: Proximal partial small bowel obstruction to the level of the anterior umbilical hernia with findings that could be suggestive of focal bowel incarceration. The remainder of the distal ileal loops are decompressed. No renal or collecting system calculi. Aortic Atherosclerosis (ICD10-I70.0). Diverticulosis without diverticulitis. Electronically Signed   By: Jonna Clark M.D.   On: 02/27/2020 23:50   Scheduled Meds: . OLANZapine zydis  5 mg Oral BID   Continuous Infusions: .  0.9 % NaCl with KCl 20 mEq / L 75 mL/hr at 02/28/20 1713  . piperacillin-tazobactam (ZOSYN)  IV 3.375 g (02/29/20 0518)  . potassium chloride 10 mEq (02/29/20 1157)  . potassium chloride      LOS: 1 day   Merlene Laughter, DO [facility] PAGER is on AMION  If 7PM-7AM, please contact night-coverage [email or web address]

## 2020-02-29 NOTE — Transfer of Care (Signed)
Immediate Anesthesia Transfer of Care Note  Patient: Sally Wang  Procedure(s) Performed: HERNIA REPAIR UMBILICAL ADULT (N/A )  Patient Location: PACU  Anesthesia Type:General  Level of Consciousness: drowsy and patient cooperative  Airway & Oxygen Therapy: Patient Spontanous Breathing and Patient connected to face mask  Post-op Assessment: Report given to RN and Post -op Vital signs reviewed and stable  Post vital signs: Reviewed and stable  Last Vitals:  Vitals Value Taken Time  BP 166/108 02/29/20 2000  Temp 36.3 C 02/29/20 1958  Pulse 95 02/29/20 2001  Resp 19 02/29/20 2001  SpO2 100 % 02/29/20 2001  Vitals shown include unvalidated device data.  Last Pain:  Vitals:   02/29/20 1637  TempSrc:   PainSc: 0-No pain         Complications: No complications documented.

## 2020-02-29 NOTE — Progress Notes (Signed)
Sitter at beside preop Charlcie Cradle, NT.

## 2020-02-29 NOTE — Progress Notes (Addendum)
CC: Abdominal pain  Subjective: Patient is manic this a.m. and in wrist restraints.  She does have an IV going and her sodium is somewhat improved.  Her abdomen is soft but she has a palpable umbilical hernia that is still tender.  It is hard to tell how tender it is.  She says she knows there is a hole there but that God was going to fill it.  Objective: Vital signs in last 24 hours: Temp:  [97.3 F (36.3 C)-97.6 F (36.4 C)] 97.3 F (36.3 C) (06/16 2016) Pulse Rate:  [100] 100 (06/16 2016) Resp:  [16-19] 19 (06/16 2016) BP: (136-150)/(81-113) 136/113 (06/16 2016) SpO2:  [100 %] 100 % (06/16 2016) Weight:  [100.2 kg] 100.2 kg (06/17 0500)  N.p.o. 558 IV recorded . 0.9 % NaCl with KCl 20 mEq / L 75 mL/hr at 02/28/20 1713  . piperacillin-tazobactam (ZOSYN)  IV 3.375 g (02/29/20 0518)  . potassium chloride 10 mEq (02/29/20 0853)  . potassium chloride    Urine 1325, no BM recorded Afebrile vital signs are stable blood pressure was up last evening. NA up to 121, potassium 3.1, creatinine 1.07. WBC 6.5 H/H 14.8/43.5. Platelets 308,000  Intake/Output from previous day: 06/16 0701 - 06/17 0700 In: 558.8 [I.V.:139.8; IV Piggyback:419] Out: 1325 [Urine:1325] Intake/Output this shift: No intake/output data recorded.  General appearance: alert and She is manic this a.m., in wrist restraints, but very pleasant. Resp: clear to auscultation bilaterally Cardio: Regular rate and rhythm GI: Abdomen soft it is generally not tender, except over the umbilicus at the 12 o'clock position you can feel the hernia it is hard and tender at this point.  Positive bowel sounds.  Lab Results:  Recent Labs    02/28/20 0039 02/29/20 0829  WBC 10.9* 6.5  HGB 15.0 14.8  HCT 42.7 43.5  PLT 339 308    BMET Recent Labs    02/28/20 1928 02/29/20 0121  NA 121* 121*  K 3.2* 3.1*  CL 75* 77*  CO2 30 27  GLUCOSE 98 92  BUN 40* 34*  CREATININE 1.12* 1.07*  CALCIUM 8.8* 8.5*    PT/INR No results for input(s): LABPROT, INR in the last 72 hours.  Recent Labs  Lab 02/27/20 1924 02/28/20 0039 02/28/20 1454 02/28/20 1928  AST 34 35 35 35  ALT 44 45* 50* 49*  ALKPHOS 87 87 87 83  BILITOT 0.8 0.7 0.9 1.0  PROT 7.9 7.6 7.5 7.3  ALBUMIN 3.9 3.8 3.7 3.6     Lipase     Component Value Date/Time   LIPASE 66 (H) 02/27/2020 1924     Medications: . OLANZapine zydis  5 mg Oral BID    Assessment/Plan Schizoaffective disorder-off medications Delusions/possible psychosis IVC last 02/27/20 Severe hyponatremia Acute kidney disease Dehydration   Nausea and vomiting with umbilical hernia by CT, most likely incarcerated  FEN: N.p.o./IV fluids removed by the patient ID: Zosyn 6/16>> day 2 DVT: None  Plan: From our standpoint the safest thing would be to go ahead and fix this hernia.  I have attempted to call Daniesha Driver her spouse; [phone] and it went to voicemail. I was able to contact her daughter Florentina Jenny [phone], and she thinks we should go forward with surgery.  She is going to contact other family members and will try to get someone up to the floor so we can talk to them and sign the permit.  I have placed a consent in the chart, she is already on  Zosyn.       LOS: 1 day    Victoriah Wilds 02/29/2020 Please see Amion

## 2020-02-29 NOTE — Anesthesia Procedure Notes (Signed)
Procedure Name: Intubation Performed by: Kizzie Fantasia, CRNA Pre-anesthesia Checklist: Patient identified, Emergency Drugs available, Suction available, Patient being monitored and Timeout performed Patient Re-evaluated:Patient Re-evaluated prior to induction Oxygen Delivery Method: Circle system utilized Preoxygenation: Pre-oxygenation with 100% oxygen Induction Type: IV induction Ventilation: Mask ventilation without difficulty Laryngoscope Size: Glidescope and 3 Grade View: Grade II Tube type: Oral Tube size: 7.0 mm Airway Equipment and Method: Video-laryngoscopy and Stylet Placement Confirmation: ETT inserted through vocal cords under direct vision,  positive ETCO2 and breath sounds checked- equal and bilateral Secured at: 21 cm Tube secured with: Tape Dental Injury: Teeth and Oropharynx as per pre-operative assessment  Difficulty Due To: Difficult Airway- due to anterior larynx Future Recommendations: Recommend- induction with short-acting agent, and alternative techniques readily available

## 2020-02-29 NOTE — Anesthesia Preprocedure Evaluation (Addendum)
Anesthesia Evaluation  Patient identified by MRN, date of birth, ID band Patient confused    Reviewed: Allergy & Precautions, NPO status , Patient's Chart, lab work & pertinent test results, Unable to perform ROS - Chart review only  Airway       Comment: Unable to assess 2/2 pt cooperation Dental   Unable to assess 2/2 pt cooperation:   Pulmonary neg pulmonary ROS,   Unable to assess 2/2 pt cooperation  Pulmonary exam normal        Cardiovascular hypertension, Pt. on medications negative cardio ROS   Rhythm:Regular Rate:Tachycardia     Neuro/Psych PSYCHIATRIC DISORDERS Schizophrenia negative neurological ROS     GI/Hepatic negative GI ROS, Neg liver ROS,   Endo/Other  negative endocrine ROS  Renal/GU Renal InsufficiencyRenal diseaseCr 1.17, K 3.2  negative genitourinary   Musculoskeletal negative musculoskeletal ROS (+)   Abdominal   Peds  Hematology negative hematology ROS (+)   Anesthesia Other Findings   Reproductive/Obstetrics                          Anesthesia Physical Anesthesia Plan  ASA: II  Anesthesia Plan: General   Post-op Pain Management:    Induction: Intravenous  PONV Risk Score and Plan: 3 and Midazolam, Dexamethasone and Ondansetron  Airway Management Planned: Oral ETT  Additional Equipment:   Intra-op Plan:   Post-operative Plan: Extubation in OR  Informed Consent: I have reviewed the patients History and Physical, chart, labs and discussed the procedure including the risks, benefits and alternatives for the proposed anesthesia with the patient or authorized representative who has indicated his/her understanding and acceptance.     Dental advisory given  Plan Discussed with: CRNA  Anesthesia Plan Comments:         Anesthesia Quick Evaluation

## 2020-03-01 ENCOUNTER — Encounter (HOSPITAL_COMMUNITY): Payer: Self-pay | Admitting: Surgery

## 2020-03-01 LAB — CBC WITH DIFFERENTIAL/PLATELET
Abs Immature Granulocytes: 0.04 10*3/uL (ref 0.00–0.07)
Basophils Absolute: 0 10*3/uL (ref 0.0–0.1)
Basophils Relative: 0 %
Eosinophils Absolute: 0 10*3/uL (ref 0.0–0.5)
Eosinophils Relative: 0 %
HCT: 41.9 % (ref 36.0–46.0)
Hemoglobin: 13.7 g/dL (ref 12.0–15.0)
Immature Granulocytes: 0 %
Lymphocytes Relative: 11 %
Lymphs Abs: 1.1 10*3/uL (ref 0.7–4.0)
MCH: 27.6 pg (ref 26.0–34.0)
MCHC: 32.7 g/dL (ref 30.0–36.0)
MCV: 84.3 fL (ref 80.0–100.0)
Monocytes Absolute: 0.3 10*3/uL (ref 0.1–1.0)
Monocytes Relative: 3 %
Neutro Abs: 8.2 10*3/uL — ABNORMAL HIGH (ref 1.7–7.7)
Neutrophils Relative %: 86 %
Platelets: 247 10*3/uL (ref 150–400)
RBC: 4.97 MIL/uL (ref 3.87–5.11)
RDW: 16.6 % — ABNORMAL HIGH (ref 11.5–15.5)
WBC: 9.7 10*3/uL (ref 4.0–10.5)
nRBC: 0 % (ref 0.0–0.2)

## 2020-03-01 LAB — COMPREHENSIVE METABOLIC PANEL
ALT: 47 U/L — ABNORMAL HIGH (ref 0–44)
AST: 25 U/L (ref 15–41)
Albumin: 3.4 g/dL — ABNORMAL LOW (ref 3.5–5.0)
Alkaline Phosphatase: 70 U/L (ref 38–126)
Anion gap: 16 — ABNORMAL HIGH (ref 5–15)
BUN: 19 mg/dL (ref 6–20)
CO2: 25 mmol/L (ref 22–32)
Calcium: 8.8 mg/dL — ABNORMAL LOW (ref 8.9–10.3)
Chloride: 90 mmol/L — ABNORMAL LOW (ref 98–111)
Creatinine, Ser: 1.01 mg/dL — ABNORMAL HIGH (ref 0.44–1.00)
GFR calc Af Amer: >60 mL/min (ref >60)
GFR calc non Af Amer: >60 mL/min (ref >60)
Glucose, Bld: 93 mg/dL (ref 70–99)
Potassium: 3.4 mmol/L — ABNORMAL LOW (ref 3.5–5.1)
Sodium: 131 mmol/L — ABNORMAL LOW (ref 135–145)
Total Bilirubin: 1.1 mg/dL (ref 0.3–1.2)
Total Protein: 6.8 g/dL (ref 6.5–8.1)

## 2020-03-01 LAB — MAGNESIUM: Magnesium: 2.3 mg/dL (ref 1.7–2.4)

## 2020-03-01 LAB — PHOSPHORUS: Phosphorus: 3.6 mg/dL (ref 2.5–4.6)

## 2020-03-01 MED ORDER — POTASSIUM CHLORIDE 10 MEQ/100ML IV SOLN
10.0000 meq | INTRAVENOUS | Status: AC
  Administered 2020-03-01 (×4): 10 meq via INTRAVENOUS
  Filled 2020-03-01 (×4): qty 100

## 2020-03-01 MED ORDER — ACETAMINOPHEN 10 MG/ML IV SOLN
1000.0000 mg | Freq: Four times a day (QID) | INTRAVENOUS | Status: AC
  Administered 2020-03-01 – 2020-03-02 (×4): 1000 mg via INTRAVENOUS
  Filled 2020-03-01 (×4): qty 100

## 2020-03-01 NOTE — Progress Notes (Signed)
Patient's vital signs rechecked, patient still Yellow MEWS score due to being agitated. Patient given PRN hydralazine for elevated BP, will continue to monitor and assess patient.

## 2020-03-01 NOTE — Progress Notes (Signed)
Patient would not let sitter recheck vital signs due to "her now resting and not wanting to be bothered". Will attempt to reassess and recheck to validate MEWS score.

## 2020-03-01 NOTE — Progress Notes (Signed)
1 Day Post-Op    CC: Abdominal pain  Subjective: Nursing tech says she just fell asleep.  She did wake up when I was in there, and currently she does not seem to be in too much discomfort.  She said it felt like there was an instrument in her stomach and she heard a belt which was my epic notification going off.  Her abdomen is soft she has a few bowel sounds.  I did pull up her gown to look at the wound since she was quiet, and resting.  She did not want the tech and I did talk in the room.  Objective: Vital signs in last 24 hours: Temp:  [97.4 F (36.3 C)-98.9 F (37.2 C)] 97.9 F (36.6 C) (06/18 0504) Pulse Rate:  [89-141] 102 (06/18 0504) Resp:  [15-26] 18 (06/18 0504) BP: (129-187)/(73-128) 137/90 (06/18 0504) SpO2:  [99 %-100 %] 100 % (06/18 0504) Weight:  [100 kg] 100 kg (06/18 0334)  N.p.o. 2541 IV Urine 300 Afebrile vital signs are stable she was tachycardic last evening NA 131 potassium 3.4, chloride 90, creatinine 1.01 WBC 9.7   Intake/Output from previous day: 06/17 0701 - 06/18 0700 In: 2541.8 [I.V.:1784.6; IV Piggyback:757.2] Out: 310 [Urine:300; Blood:10] Intake/Output this shift: No intake/output data recorded.  General appearance: cooperative and no distress Resp: clear to auscultation bilaterally GI: Soft, few bowel sounds still tender over the incision site.  Lab Results:  Recent Labs    02/29/20 0829 03/01/20 0416  WBC 6.5 9.7  HGB 14.8 13.7  HCT 43.5 41.9  PLT 308 247    BMET Recent Labs    02/29/20 0829 03/01/20 0416  NA 127* 131*  K 3.2* 3.4*  CL 82* 90*  CO2 31 25  GLUCOSE 93 93  BUN 29* 19  CREATININE 1.17* 1.01*  CALCIUM 8.9 8.8*   PT/INR No results for input(s): LABPROT, INR in the last 72 hours.  Recent Labs  Lab 02/28/20 0039 02/28/20 1454 02/28/20 1928 02/29/20 0829 03/01/20 0416  AST 35 35 35 28 25  ALT 45* 50* 49* 48* 47*  ALKPHOS 87 87 83 84 70  BILITOT 0.7 0.9 1.0 1.0 1.1  PROT 7.6 7.5 7.3 6.9 6.8  ALBUMIN  3.8 3.7 3.6 3.3* 3.4*     Lipase     Component Value Date/Time   LIPASE 66 (H) 02/27/2020 1924     Medications:  OLANZapine zydis  5 mg Oral BID    Assessment/Plan Schizoaffective disorder-off medications Delusions/possible psychosis IVC last 02/27/20 Severe hyponatremia Acute kidney disease Dehydration  Incarcerated umbilical hernia Repair of incarcerated umbilical hernia 02/29/2020, DR. Abigail Miyamoto  FEN: N.p.o./IV fluids removed by the patient ID: Zosyn 6/16>> day 2 DVT: None Follow-up: DR. Abigail Miyamoto   Plan: I get a put her on ice chips and sips for now.  Allow them to give her some p.o. medicines if she will take them.  Her creatinine is just returned to normal so I am going to put her on some IV Tylenol for pain control.  We will advance her diet as her bowel function returns.       LOS: 2 days    Primitivo Merkey 03/01/2020 Please see Amion

## 2020-03-01 NOTE — Progress Notes (Signed)
Pharmacy Antibiotic Note  Sally Wang is a 59 y.o. female admitted on 02/27/2020 with Intra-abdominal infection.  Pharmacy has been consulted for zosyn dosing.  03/01/2020 Scr 1.01, CrCl ~ 57mls/min WBC 9.7 Afebrile  Plan: Continue Zosyn 3.375g IV Q8H infused over 4hrs. Pharmacy will sign off as renal function stable, will follow peripherally  Height: 5\' 7"  (170.2 cm) Weight: 100 kg (220 lb 7.4 oz) IBW/kg (Calculated) : 61.6  Temp (24hrs), Avg:98.2 F (36.8 C), Min:97.4 F (36.3 C), Max:98.9 F (37.2 C)  Recent Labs  Lab 02/27/20 1924 02/27/20 1924 02/28/20 0039 02/28/20 1454 02/28/20 1707 02/28/20 1928 02/29/20 0121 02/29/20 0829 03/01/20 0416  WBC 9.7  --  10.9*  --   --   --   --  6.5 9.7  CREATININE 1.22*   < > 1.20*   < > 1.08* 1.12* 1.07* 1.17* 1.01*   < > = values in this interval not displayed.    Estimated Creatinine Clearance: 73.8 mL/min (A) (by C-G formula based on SCr of 1.01 mg/dL (H)).    No Known Allergies  Antimicrobials this admission: Zosyn 02/28/2020 >>   Dose adjustments this admission: -  Microbiology results: -  Thank you for allowing pharmacy to be a part of this patient's care.  03/01/2020 RPh 03/01/2020, 9:59 AM

## 2020-03-01 NOTE — Care Management Important Message (Signed)
Important Message  Patient Details IM Letter given to Daryel Gerald SW Case Manager to present to the Patient Name: Sally Wang MRN: [id number] Date of Birth: [birth year]   Medicare Important Message Given:  Yes     Caren Macadam 03/01/2020, 10:20 AM

## 2020-03-01 NOTE — Progress Notes (Signed)
PROGRESS NOTE    Sally Wang  WOE:[id number] DOB: 12/16/1960 DOA: 02/27/2020 PCP: Patient, No Pcp Per   Brief Narrative:  HPI per Dr. Midge Minium on 02/27/2020 Sally Wang is a 59 y.o. female with history of hypertension and schizoaffective disorder admitted and discharged from behavioral health on 02/05/2020 and as per the husband since the discharge has not been taking any of her medications was brought to the ER the patient has been having persistent nausea vomiting for the last 1 week.  Not sure when was her last bowel movement.  Patient has not had any chest pain shortness of breath or any fever chills.  At times the vomitus is blood-tinged.  During last admission patient was Covid positive.  ED Course: In the ER patient appears confused and at times talking hyperreligious.  Patient is not allowing completed physical exam.  Labs are significant for sodium 116 potassium 3 creatinine 1.2 lipase 66 with hemoglobin of 15.4 Covid test was negative.  Drug screen is negative EKG shows sinus tachycardia.  **Interim History Patient remained extremely manic schizoaffective disorder she has been noncompliant with her medications.  Psychiatry was consulted andrecommending considering Zyprexa Zydis 5 mg p.o. or IM twice daily and they recommend continuing one-to-one continuous observation monitoring and recommending psychiatric inpatient admission when she is medically cleared.    General Surgery evaluated and feels that she has incarcerated  hernia containing bowel that cannot be reduced.  She is at risk for bowel strangulation and eventual perforation without surgery and they discussed with the family and they understand and wish to proceed with the surgery which was done.  Patient sodium is improving slowly however she remained hyperreligious and extremely manic today in four-point restraints.  Surgery has evaluated and recommending ice chips and ambulation as well as okay medications but  advancing diet once bowel function slowly returned.  Assessment & Plan:   Principal Problem:   Hyponatremia Active Problems:   Schizoaffective disorder (HCC)   ARF (acute renal failure) (HCC)   Hypokalemia  Severe hyponatremia with hypokalemia and CKD stage III in the setting of significant dehydration, improving slowly -Suspect patient's hyponatremia could be from dehydration that patient has been having vomiting.   -Labs were checked for her CKD -C/w gently hydrating thinking that patient could be probably dehydrated given the acute renal failure with nausea vomiting. -Sodium is improving and potassium is still on the lower side but will be replete -Sodium is now 131 up from 116 -Potassium was 3.4 today -continue monitor repeat as necessary -Continue with normal saline +20 mEq of KCl and IV fluid hydration rate was increased by general surgery last night to 100 MLS per hour we will continue to monitor carefully -Repeat BMPs every 4 and CMP in a.m.  CKD stage IIIa Elevated Anion Gap -Patient's BUNs/creatinine has been relatively stable despite getting IV fluid hydration which leads me to believe that she does have some evidence of renal dysfunction -Anion Gap is now 16 -Patient BUN/creatinine is now 19/1.01 and is trended down from 48/1.20 on admission -Avoid further nephrotoxic medications, contrast dyes, hypotension and renally dose medications Repeat CMP in a.m.  Hyponatremia/Hypochloremia -Slowly improving and sodium is gone from 116 on admission is now 131 -Patient is chloride level has gone from 67is now 90  Elevated ALT -Patient's ALT has been mildly elevated and peaked at 50 and today it is 47  -On presentation it was 45 -Continue monitor and trend and if continues to persistently be elevated  will obtain a right upper quadrant ultrasound as well as a acute hepatitis panel  -Repeat CMP in a.m.   Leukocytosis -Patient's WBC went from 10.9 -> 6.5 -> 9.7 -Continue to  monitor for signs and symptoms of infection; likely in the setting of her incarcerated hernia -Repeat CBC in a.m.  Hypokalemia -See above  -Patient's potassium was 3.4 today -Replete with IV KCl 40 mEq as well as normal saline with 20 mEq of KCl continuous as above -Magnesium level was 2.3 today -Continue to monitor and replete as necessary -Repeat CMP in the a.m.  Nausea and Vomiting, improved  -Cause not clear but likley be incarcerated hernia as the CT of the abdomen pelvis showed a proximal partial small bowel obstruction to the level of the anterior umbilical hernia findings that could be suggestive of focal bowel incarceration with the remainder of the distal ileal loops being decompressed -General surgery was consulted and recommending n.p.o. and fluid hydration prior to her surgical intervention -Because she continues to have an incarcerated hernia containing bowel which can her be reduced she is at risk for bowel strangulation and omental perforation without surgery -She has been IVC but did not have the medical decision mental capacity to make decisions about surgery but family is active on her behalf and consented for surgical procedure -She is postoperative day 1 and now on sips with chips of ice  Hypertension  -Does not take any medication as per the patient has been medically patient.  IV hydralazine  Schizoaffective Disorder  -Has not been taking any of her medications since last discharge on 02/05/2020.  -She is currently extremely manic and psychiatry been consulted.  She has been IVC then in four-point restraints now as she is moved multiple IVs. -Psychiatry recommends inpatient psychiatric hospitalization once she is medically cleared and improved and after she had her surgery  Obesity -Estimated body mass index is 34.53 kg/m as calculated from the following:   Height as of this encounter: 5\' 7"  (1.702 m).   Weight as of this encounter: 100 kg. -Continued Weight Loss  and Dietary Counseling   DVT prophylaxis: SCDs Code Status: FULL CODE  Family Communication: No family present at bedside  Disposition Plan: Remain inpatient given her incarcerated hernia containing bowel that needs surgical intervention and will need to go to inpatient psychiatric hospitalization when she is improved from a surgical standpoint  Status is: Inpatient  Remains inpatient appropriate because:Persistent severe electrolyte disturbances, Unsafe d/c plan, IV treatments appropriate due to intensity of illness or inability to take PO and Inpatient level of care appropriate due to severity of illness   Dispo: The patient is from: Home              Anticipated d/c is to: [facility]              Anticipated d/c date is: 2 days              Patient currently is not medically stable to d/c.   Consultants:  General Surgery Psychiatry    Procedures: None   Antimicrobials:  Anti-infectives (From admission, onward)   Start     Dose/Rate Route Frequency Ordered Stop   02/28/20 0230  piperacillin-tazobactam (ZOSYN) IVPB 3.375 g     Discontinue     3.375 g 12.5 mL/hr over 240 Minutes Intravenous Every 8 hours 02/28/20 0201       Subjective: Seen at bedside and she remains extremely manic but is able to tolerate some p.o.  intake.  No nausea or vomiting.  Continues to be hyper religious and has very tangential thinking and pressured speech.  No acute events overnight.  No other concerns or plans at this time.  Objective: Vitals:   02/29/20 2253 03/01/20 0044 03/01/20 0334 03/01/20 0504  BP: 135/73 129/80  137/90  Pulse: (!) 113 (!) 105  (!) 102  Resp: 17   18  Temp: 98.6 F (37 C) 98.9 F (37.2 C)  97.9 F (36.6 C)  TempSrc: Oral Axillary    SpO2: 99% 99%  100%  Weight:   100 kg   Height:        Intake/Output Summary (Last 24 hours) at 03/01/2020 1407 Last data filed at 02/29/2020 2300 Gross per 24 hour  Intake 2541.78 ml  Output 310 ml  Net 2231.78 ml   Filed Weights    02/28/20 0002 02/29/20 0500 03/01/20 0334  Weight: 100.5 kg 100.2 kg 100 kg   Examination: Physical Exam:  Constitutional: WN/WD obese African-American female who is currently still manic and psychotic and extremely hyperreligious with pressured speech Eyes: Lids and conjunctivae normal, sclerae anicteric  ENMT: External Ears, Nose appear normal. Grossly normal hearing.  Neck: Appears normal, supple, no cervical masses, normal ROM, no appreciable thyromegaly neck: No JVD Respiratory: Diminished to auscultation bilaterally, no wheezing, rales, rhonchi or crackles. Normal respiratory effort and patient is not tachypenic. No accessory muscle use.  Unlabored breathing Cardiovascular: RRR, no murmurs / rubs / gallops. S1 and S2 auscultated. No extremity edema.  Abdomen: Soft, tender in abdomen slightly, distended secondary body habitus.  Incisions appear clean dry and intact.. Bowel sounds positive.  GU: Deferred. Musculoskeletal: No clubbing / cyanosis of digits/nails. No joint deformity upper and lower extremities.  Skin: No rashes, lesions, ulcers on limited skin evaluation. No induration; Warm and dry.  Neurologic: CN 2-12 grossly intact with no focal deficits.  Romberg sign cerebellar reflexes not assessed.  Psychiatric: Impaired judgment and insight.  She is awake but extremely manic and having psychotic features with pressured speech and tangential thinking and she is extremely hyperreligious  Data Reviewed: I have personally reviewed following labs and imaging studies  CBC: Recent Labs  Lab 02/27/20 1924 02/28/20 0039 02/29/20 0829 03/01/20 0416  WBC 9.7 10.9* 6.5 9.7  NEUTROABS 7.0 8.2* 4.3 8.2*  HGB 15.4* 15.0 14.8 13.7  HCT 43.9 42.7 43.5 41.9  MCV 80.3 80.4 82.4 84.3  PLT 385 339 308 247   Basic Metabolic Panel: Recent Labs  Lab 02/28/20 1707 02/28/20 1928 02/29/20 0121 02/29/20 0829 03/01/20 0416  NA 121* 121* 121* 127* 131*  K 3.1* 3.2* 3.1* 3.2* 3.4*  CL 74* 75*  77* 82* 90*  CO2 31 30 27 31 25   GLUCOSE 98 98 92 93 93  BUN 42* 40* 34* 29* 19  CREATININE 1.08* 1.12* 1.07* 1.17* 1.01*  CALCIUM 8.7* 8.8* 8.5* 8.9 8.8*  MG  --   --   --  2.5* 2.3  PHOS  --   --   --  3.6 3.6   GFR: Estimated Creatinine Clearance: 73.8 mL/min (A) (by C-G formula based on SCr of 1.01 mg/dL (H)). Liver Function Tests: Recent Labs  Lab 02/28/20 0039 02/28/20 1454 02/28/20 1928 02/29/20 0829 03/01/20 0416  AST 35 35 35 28 25  ALT 45* 50* 49* 48* 47*  ALKPHOS 87 87 83 84 70  BILITOT 0.7 0.9 1.0 1.0 1.1  PROT 7.6 7.5 7.3 6.9 6.8  ALBUMIN 3.8 3.7 3.6 3.3* 3.4*  Recent Labs  Lab 02/27/20 1924  LIPASE 66*   No results for input(s): AMMONIA in the last 168 hours. Coagulation Profile: No results for input(s): INR, PROTIME in the last 168 hours. Cardiac Enzymes: No results for input(s): CKTOTAL, CKMB, CKMBINDEX, TROPONINI in the last 168 hours. BNP (last 3 results) No results for input(s): PROBNP in the last 8760 hours. HbA1C: No results for input(s): HGBA1C in the last 72 hours. CBG: No results for input(s): GLUCAP in the last 168 hours. Lipid Profile: No results for input(s): CHOL, HDL, LDLCALC, TRIG, CHOLHDL, LDLDIRECT in the last 72 hours. Thyroid Function Tests: Recent Labs    02/28/20 0039  TSH 0.861   Anemia Panel: No results for input(s): VITAMINB12, FOLATE, FERRITIN, TIBC, IRON, RETICCTPCT in the last 72 hours. Sepsis Labs: No results for input(s): PROCALCITON, LATICACIDVEN in the last 168 hours.  Recent Results (from the past 240 hour(s))  SARS Coronavirus 2 by RT PCR (hospital order, performed in [facility] hospital lab) Nasopharyngeal Nasopharyngeal Swab     Status: None   Collection Time: 02/27/20  7:24 PM   Specimen: Nasopharyngeal Swab  Result Value Ref Range Status   SARS Coronavirus 2 NEGATIVE NEGATIVE Final    Comment: (NOTE) SARS-CoV-2 target nucleic acids are NOT DETECTED.  The SARS-CoV-2 RNA is generally detectable in  upper and lower respiratory specimens during the acute phase of infection. The lowest concentration of SARS-CoV-2 viral copies this assay can detect is 250 copies / mL. A negative result does not preclude SARS-CoV-2 infection and should not be used as the sole basis for treatment or other patient management decisions.  A negative result may occur with improper specimen collection / handling, submission of specimen other than nasopharyngeal swab, presence of viral mutation(s) within the areas targeted by this assay, and inadequate number of viral copies (<250 copies / mL). A negative result must be combined with clinical observations, patient history, and epidemiological information.  Fact Sheet for Patients:   StrictlyIdeas.no  Fact Sheet for Healthcare Providers: BankingDealers.co.za  This test is not yet approved or  cleared by the [place] FDA and has been authorized for detection and/or diagnosis of SARS-CoV-2 by FDA under an Emergency Use Authorization (EUA).  This EUA will remain in effect (meaning this test can be used) for the duration of the COVID-19 declaration under Section 564(b)(1) of the Act, 21 U.S.C. section 360bbb-3(b)(1), unless the authorization is terminated or revoked sooner.  Performed at [facility], [place] [address] [place], [place] [address]      RN Pressure Injury Documentation:     Estimated body mass index is 34.53 kg/m as calculated from the following:   Height as of this encounter: 5\' 7"  (1.702 m).   Weight as of this encounter: 100 kg.  Malnutrition Type:      Malnutrition Characteristics:      Nutrition Interventions:    Radiology Studies: No results found. Scheduled Meds: . OLANZapine zydis  5 mg Oral BID   Continuous Infusions: . 0.9 % NaCl with KCl 20 mEq / L 100 mL/hr at 03/01/20 1016  . acetaminophen 1,000 mg (03/01/20 0959)  . piperacillin-tazobactam  (ZOSYN)  IV 3.375 g (03/01/20 0530)  . potassium chloride 10 mEq (03/01/20 1342)    LOS: 2 days   Kerney Elbe, DO [facility] PAGER is on [place]  If 7PM-7AM, please contact night-coverage [email or web address]

## 2020-03-01 NOTE — Progress Notes (Signed)
   03/01/20 1418  Assess: MEWS Score  Temp 98.7 F (37.1 C)  BP (!) 145/96  Pulse Rate (!) 115  SpO2 100 %  O2 Device Room Air  Assess: MEWS Score  MEWS Temp 0  MEWS Systolic 0  MEWS Pulse 2  MEWS RR 0  MEWS LOC 0  MEWS Score 2  MEWS Score Color Yellow  Assess: if the MEWS score is Yellow or Red  Were vital signs taken at a resting state? No (patient agitated; rambled speech)  Focused Assessment Documented focused assessment  Early Detection of Sepsis Score *See Row Information* Low  MEWS guidelines implemented *See Row Information* No, previously yellow, continue vital signs every 4 hours  Treat  MEWS Interventions Administered scheduled meds/treatments  Notify: Charge Nurse/RN  Name of Charge Nurse/RN Notified Kasia  Date Charge Nurse/RN Notified 03/01/20  Time Charge Nurse/RN Notified 1436    Patient was not in a resting state when vital signs were obtained. Patient was slightly agitated, having rambled speech, and delusional. Patient has been consistently tachycardiac due to being in a manic state. Will try to obtain another set of vitals when patient is more calm.

## 2020-03-02 LAB — CBC WITH DIFFERENTIAL/PLATELET
Abs Immature Granulocytes: 0.04 10*3/uL (ref 0.00–0.07)
Basophils Absolute: 0 10*3/uL (ref 0.0–0.1)
Basophils Relative: 0 %
Eosinophils Absolute: 0.1 10*3/uL (ref 0.0–0.5)
Eosinophils Relative: 1 %
HCT: 39.4 % (ref 36.0–46.0)
Hemoglobin: 12.9 g/dL (ref 12.0–15.0)
Immature Granulocytes: 1 %
Lymphocytes Relative: 27 %
Lymphs Abs: 1.7 10*3/uL (ref 0.7–4.0)
MCH: 28.3 pg (ref 26.0–34.0)
MCHC: 32.7 g/dL (ref 30.0–36.0)
MCV: 86.4 fL (ref 80.0–100.0)
Monocytes Absolute: 0.4 10*3/uL (ref 0.1–1.0)
Monocytes Relative: 6 %
Neutro Abs: 4.1 10*3/uL (ref 1.7–7.7)
Neutrophils Relative %: 65 %
Platelets: 194 10*3/uL (ref 150–400)
RBC: 4.56 MIL/uL (ref 3.87–5.11)
RDW: 17.2 % — ABNORMAL HIGH (ref 11.5–15.5)
WBC: 6.4 10*3/uL (ref 4.0–10.5)
nRBC: 0 % (ref 0.0–0.2)

## 2020-03-02 LAB — COMPREHENSIVE METABOLIC PANEL
ALT: 39 U/L (ref 0–44)
AST: 21 U/L (ref 15–41)
Albumin: 3.1 g/dL — ABNORMAL LOW (ref 3.5–5.0)
Alkaline Phosphatase: 69 U/L (ref 38–126)
Anion gap: 9 (ref 5–15)
BUN: 16 mg/dL (ref 6–20)
CO2: 26 mmol/L (ref 22–32)
Calcium: 8.1 mg/dL — ABNORMAL LOW (ref 8.9–10.3)
Chloride: 91 mmol/L — ABNORMAL LOW (ref 98–111)
Creatinine, Ser: 0.97 mg/dL (ref 0.44–1.00)
GFR calc Af Amer: >60 mL/min (ref >60)
GFR calc non Af Amer: >60 mL/min (ref >60)
Glucose, Bld: 85 mg/dL (ref 70–99)
Potassium: 4 mmol/L (ref 3.5–5.1)
Sodium: 126 mmol/L — ABNORMAL LOW (ref 135–145)
Total Bilirubin: 0.4 mg/dL (ref 0.3–1.2)
Total Protein: 6.1 g/dL — ABNORMAL LOW (ref 6.5–8.1)

## 2020-03-02 LAB — PHOSPHORUS: Phosphorus: 2.4 mg/dL — ABNORMAL LOW (ref 2.5–4.6)

## 2020-03-02 LAB — MAGNESIUM: Magnesium: 2.1 mg/dL (ref 1.7–2.4)

## 2020-03-02 MED ORDER — SODIUM CHLORIDE 0.9 % IV SOLN: INTRAVENOUS | Status: DC

## 2020-03-02 MED ORDER — SODIUM PHOSPHATES 45 MMOLE/15ML IV SOLN
10.0000 mmol | Freq: Once | INTRAVENOUS | Status: AC
  Administered 2020-03-02: 10 mmol via INTRAVENOUS
  Filled 2020-03-02: qty 3.33

## 2020-03-02 NOTE — Progress Notes (Addendum)
2 Days Post-Op   Subjective/Chief Complaint: None Patient has flight of ideas.  Awake and alert but not coherent in speech nor thought.  Denies pain.  Sitter at bedside   Objective: Vital signs in last 24 hours: Temp:  [97.9 F (36.6 C)-98.7 F (37.1 C)] 97.9 F (36.6 C) (06/19 3491) Pulse Rate:  [89-119] 89 (06/19 0613) Resp:  [16-22] 18 (06/19 0613) BP: (138-162)/(78-103) 146/93 (06/19 0613) SpO2:  [98 %-100 %] 100 % (06/19 7915) Weight:  [105.6 kg] 105.6 kg (06/19 0613)    Intake/Output from previous day: 06/18 0701 - 06/19 0700 In: 2458 [P.O.:960; I.V.:482.9; IV Piggyback:1015.1] Out: -  Intake/Output this shift: No intake/output data recorded.  Incision/Wound: Dressing removed.  Staples intact.  Soft nontender.  Incision intact.  No rebound or guarding.  Lab Results:  Recent Labs    02/29/20 0829 03/01/20 0416  WBC 6.5 9.7  HGB 14.8 13.7  HCT 43.5 41.9  PLT 308 247   BMET Recent Labs    02/29/20 0829 03/01/20 0416  NA 127* 131*  K 3.2* 3.4*  CL 82* 90*  CO2 31 25  GLUCOSE 93 93  BUN 29* 19  CREATININE 1.17* 1.01*  CALCIUM 8.9 8.8*   PT/INR No results for input(s): LABPROT, INR in the last 72 hours. ABG No results for input(s): PHART, HCO3 in the last 72 hours.  Invalid input(s): PCO2, PO2  Studies/Results: No results found.  Anti-infectives: Anti-infectives (From admission, onward)   Start     Dose/Rate Route Frequency Ordered Stop   02/28/20 0230  piperacillin-tazobactam (ZOSYN) IVPB 3.375 g     Discontinue     3.375 g 12.5 mL/hr over 240 Minutes Intravenous Every 8 hours 02/28/20 0201        Assessment/Plan: s/p Procedure(s): HERNIA REPAIR UMBILICAL ADULT (N/A)  advance diet.  Surgically ready for discharge and/or placement per medical service.  Stable stay in for 10 days and these can be removed depending on the final disposition.  LOS: 3 days    Dortha Schwalbe MD 03/02/2020

## 2020-03-02 NOTE — Progress Notes (Signed)
   03/01/20 2221  Assess: MEWS Score  Temp 98.1 F (36.7 C)  BP 138/78  Pulse Rate (!) 110  Resp (!) 22  SpO2 98 %  Assess: MEWS Score  MEWS Temp 0  MEWS Systolic 0  MEWS Pulse 1  MEWS RR 1  MEWS LOC 0  MEWS Score 2  MEWS Score Color Yellow  Assess: if the MEWS score is Yellow or Red  Were vital signs taken at a resting state? Yes  Focused Assessment Documented focused assessment  Early Detection of Sepsis Score *See Row Information* Low  MEWS guidelines implemented *See Row Information* No, previously yellow, continue vital signs every 4 hours  Document  Patient Outcome Other (Comment) (no tretment required)  Progress note created (see row info) Yes

## 2020-03-02 NOTE — Progress Notes (Signed)
PROGRESS NOTE    Sally Wang  ZOX:[id number] DOB: [birth year] DOA: 02/27/2020 PCP: Patient, No Pcp Per   Brief Narrative:  HPI per Dr. Midge MiniumArshad Kakrakandy on 02/27/2020 [name] is a 59 y.o. female with history of hypertension and schizoaffective disorder admitted and discharged from behavioral health on 02/05/2020 and as per the husband since the discharge has not been taking any of her medications was brought to the ER the patient has been having persistent nausea vomiting for the last 1 week.  Not sure when was her last bowel movement.  Patient has not had any chest pain shortness of breath or any fever chills.  At times the vomitus is blood-tinged.  During last admission patient was Covid positive.  ED Course: In the ER patient appears confused and at times talking hyperreligious.  Patient is not allowing completed physical exam.  Labs are significant for sodium 116 potassium 3 creatinine 1.2 lipase 66 with hemoglobin of 15.4 Covid test was negative.  Drug screen is negative EKG shows sinus tachycardia.  **Interim History Patient remained extremely manic schizoaffective disorder she has been noncompliant with her medications.  Psychiatry was consulted andrecommending considering Zyprexa Zydis 5 mg p.o. or IM twice daily and they recommend continuing one-to-one continuous observation monitoring and recommending psychiatric inpatient admission when she is medically cleared.    General Surgery evaluated and feels that she has incarcerated  hernia containing bowel that cannot be reduced.  She is at risk for bowel strangulation and eventual perforation without surgery and they discussed with the family and they understand and wish to proceed with the surgery which was done.  Patient sodium is improving slowly however she remained hyperreligious and extremely manic today in four-point restraints.  Surgery has evaluated and recommending ice chips and ambulation as well as okay medications but  advancing diet once bowel function slowly returned.  Today 03/02/2020 her diet was advanced to regular diet.  She will be resumed on IV fluid hydration with normal saline given that her sodium dropped slightly from yesterday.  Sodium is now 126 down from 141  Assessment & Plan:   Principal Problem:   Hyponatremia Active Problems:   Schizoaffective disorder (HCC)   ARF (acute renal failure) (HCC)   Hypokalemia  Severe hyponatremia with hypokalemia and CKD stage III in the setting of significant dehydration, improving slowly but dropped slightly -Suspect patient's hyponatremia could be from dehydration that patient has been having vomiting.   -C/w gently hydrating thinking that patient could be probably dehydrated given the acute renal failure with nausea vomiting.  Nausea vomiting is now improved.  Sodium -Sodium is improving and potassium is still on the lower side but will be replete -Sodium was 131 up from 116 but dropped slightly and is now 126.  Will resume IV fluids with normal saline at 75 mL's per hour and will also give 10 mEq of IV sodium phosphate -Potassium was 3.4 today -continue monitor repeat as necessary -IV fluids had timed out and stopped but will resume at 75 MLS per hour -Repeat CMP in a.m.  CKD stage IIIa, improving Elevated Anion Gap, improved -Patient's BUNs/creatinine has been relatively stable despite getting IV fluid hydration which leads me to believe that she does have some evidence of renal dysfunction -Anion Gap is now 9 down from 16 -Patient BUN/creatinine is now 16/0.97 and is trended down from 48/1.20 on admission -Avoid further nephrotoxic medications, contrast dyes, hypotension and renally dose medications Repeat CMP in a.m.  Hyponatremia/Hypochloremia -Slowly  improving and sodium is gone from 116 on admission is now 131 yesterday and today is 126 -Patient is chloride level has gone from 67is now 91 -Resumed IV fluid hydration with normal saline at 75  mils per hour plus IV sodium phosphate 10 mmol -Continue to monitor and trend -Repeat CBC in a.m.  Elevated ALT -Patient's ALT has been mildly elevated and peaked at 50 and today it is 39 -On presentation it was 45 -Continue monitor and trend and if continues to persistently be elevated will obtain a right upper quadrant ultrasound as well as a acute hepatitis panel  -Repeat CMP in a.m.    Leukocytosis -Patient's WBC went from 10.9 -> 6.5 -> 9.7 -> 6.4 -Continue to monitor for signs and symptoms of infection; likely in the setting of her incarcerated hernia -Repeat CBC in a.m.  Hypokalemia -See above  -Patient's potassium was 4.0 today  -Magnesium level was 2.1 today -Continue to monitor and replete as necessary -Repeat CMP in the a.m.  Nausea and Vomiting, improved  -Cause not clear but likley be incarcerated hernia as the CT of the abdomen pelvis showed a proximal partial small bowel obstruction to the level of the anterior umbilical hernia findings that could be suggestive of focal bowel incarceration with the remainder of the distal ileal loops being decompressed -General surgery was consulted and recommending n.p.o. and fluid hydration prior to her surgical intervention -Because she continues to have an incarcerated hernia containing bowel which can her be reduced she is at risk for bowel strangulation and omental perforation without surgery -She has been IVC but did not have the medical decision mental capacity to make decisions about surgery but family is active on her behalf and consented for surgical procedure -She is postoperative day 1 and now on sips with chips of ice  Hypertension  -Does not take any medication as per the patient has been medically patient.  IV hydralazine  Schizoaffective Disorder  -Has not been taking any of her medications since last discharge on 02/05/2020.  -She is currently extremely manic and psychiatry been consulted.  She has been IVC then in  four-point restraints now as she is moved multiple IVs. -Psychiatry recommends inpatient psychiatric hospitalization once she is medically cleared and improved and after she had her surgery; surgery has now cleared the patient from their standpoint and recommending having the staples remain for 10 days and removed depending on the final disposition  Obesity -Estimated body mass index is 36.46 kg/m as calculated from the following:   Height as of this encounter: 5\' 7"  (1.702 m).   Weight as of this encounter: 105.6 kg. -Continued Weight Loss and Dietary Counseling   DVT prophylaxis: SCDs Code Status: FULL CODE  Family Communication: No family present at bedside  Disposition Plan: Remain inpatient given her incarcerated hernia containing bowel that needs surgical intervention and will need to go to inpatient psychiatric hospitalization when she is improved from a surgical standpoint.  Sodium is improving and her surgery is sign of the case.  If her sodium is close to normal or normalized likely can be discharged to inpatient psychiatric facility  Status is: Inpatient  Remains inpatient appropriate because:Persistent severe electrolyte disturbances, Unsafe d/c plan, IV treatments appropriate due to intensity of illness or inability to take PO and Inpatient level of care appropriate due to severity of illness   Dispo: The patient is from: Home              Anticipated d/c is  to: [facility]              Anticipated d/c date is: 2 days              Patient currently is not medically stable to d/c.  She is very close to being medically stable though  Consultants:  General Surgery Psychiatry    Procedures: Repair of incarcerated umbilical hernia   Antimicrobials:  Anti-infectives (From admission, onward)   Start     Dose/Rate Route Frequency Ordered Stop   02/28/20 0230  piperacillin-tazobactam (ZOSYN) IVPB 3.375 g     Discontinue     3.375 g 12.5 mL/hr over 240 Minutes Intravenous Every 8  hours 02/28/20 0201       Subjective: Seen at bedside and she remains extremely manic again and is tolerating p.o. well now advance to regular diet.  She continues to have flight of ideas and tangential and pressured speech.  Continues also be hyperreligious.  When I spoke to her and that her sodium dropped and was slightly worse today she states that I should not be "negative".  No no other concerns or complaints at this time and she does not believe that her lab values are hers.  Objective: Vitals:   03/01/20 1418 03/01/20 1802 03/01/20 2221 03/02/20 0613  BP: (!) 145/96 (!) 162/103 138/78 (!) 146/93  Pulse: (!) 115 (!) 119 (!) 110 89  Resp:  16 (!) 22 18  Temp: 98.7 F (37.1 C) 98.7 F (37.1 C) 98.1 F (36.7 C) 97.9 F (36.6 C)  TempSrc: Oral Oral Oral Oral  SpO2: 100% 100% 98% 100%  Weight:    105.6 kg  Height:        Intake/Output Summary (Last 24 hours) at 03/02/2020 1312 Last data filed at 03/02/2020 0930 Gross per 24 hour  Intake 1857.95 ml  Output --  Net 1857.95 ml   Filed Weights   02/29/20 0500 03/01/20 0334 03/02/20 0613  Weight: 100.2 kg 100 kg 105.6 kg   Examination: Physical Exam:  Constitutional: WN/WD obese African-American female who is still currently extremely manic and psychotic having flight of ideas and tangential thinking and is extremely hyperreligious with pressured speech Eyes: Lids and conjunctivae normal, sclerae anicteric  ENMT: External Ears, Nose appear normal. Grossly normal hearing.  Neck: Appears normal, supple, no cervical masses, normal ROM, no appreciable thyromegaly; no JVD Respiratory: Diminished to auscultation bilaterally, no wheezing, rales, rhonchi or crackles. Normal respiratory effort and patient is not tachypenic. No accessory muscle use.  Unlabored breathing Cardiovascular: RRR, no murmurs / rubs / gallops. S1 and S2 auscultated.  Abdomen: Soft, slightly tender, distended secondary body habitus.  Has a midline incision with  staples bowel sounds positive.  GU: Deferred. Musculoskeletal: No clubbing / cyanosis of digits/nails. No joint deformity upper and lower extremities.  Skin: No rashes, lesions, ulcers on limited skin evaluation. No induration; Warm and dry.  Neurologic: CN 2-12 grossly intact with no focal deficits. Romberg sign and cerebellar reflexes not assessed.  Psychiatric: Tired judgment and insight. Alert but is not oriented x 3.  She is extremely manic with psychotic features with pressured speech, tangential thinking, and flight of ideas and she is extremely hyperreligious  Data Reviewed: I have personally reviewed following labs and imaging studies  CBC: Recent Labs  Lab 02/27/20 1924 02/28/20 0039 02/29/20 0829 03/01/20 0416 03/02/20 0905  WBC 9.7 10.9* 6.5 9.7 6.4  NEUTROABS 7.0 8.2* 4.3 8.2* 4.1  HGB 15.4* 15.0 14.8 13.7 12.9  HCT  43.9 42.7 43.5 41.9 39.4  MCV 80.3 80.4 82.4 84.3 86.4  PLT 385 339 308 247 161   Basic Metabolic Panel: Recent Labs  Lab 02/28/20 1928 02/29/20 0121 02/29/20 0829 03/01/20 0416 03/02/20 0905  NA 121* 121* 127* 131* 126*  K 3.2* 3.1* 3.2* 3.4* 4.0  CL 75* 77* 82* 90* 91*  CO2 30 27 31 25 26   GLUCOSE 98 92 93 93 85  BUN 40* 34* 29* 19 16  CREATININE 1.12* 1.07* 1.17* 1.01* 0.97  CALCIUM 8.8* 8.5* 8.9 8.8* 8.1*  MG  --   --  2.5* 2.3 2.1  PHOS  --   --  3.6 3.6 2.4*   GFR: Estimated Creatinine Clearance: 79 mL/min (by C-G formula based on SCr of 0.97 mg/dL). Liver Function Tests: Recent Labs  Lab 02/28/20 1454 02/28/20 1928 02/29/20 0829 03/01/20 0416 03/02/20 0905  AST 35 35 28 25 21   ALT 50* 49* 48* 47* 39  ALKPHOS 87 83 84 70 69  BILITOT 0.9 1.0 1.0 1.1 0.4  PROT 7.5 7.3 6.9 6.8 6.1*  ALBUMIN 3.7 3.6 3.3* 3.4* 3.1*   Recent Labs  Lab 02/27/20 1924  LIPASE 66*   No results for input(s): AMMONIA in the last 168 hours. Coagulation Profile: No results for input(s): INR, PROTIME in the last 168 hours. Cardiac Enzymes: No  results for input(s): CKTOTAL, CKMB, CKMBINDEX, TROPONINI in the last 168 hours. BNP (last 3 results) No results for input(s): PROBNP in the last 8760 hours. HbA1C: No results for input(s): HGBA1C in the last 72 hours. CBG: No results for input(s): GLUCAP in the last 168 hours. Lipid Profile: No results for input(s): CHOL, HDL, LDLCALC, TRIG, CHOLHDL, LDLDIRECT in the last 72 hours. Thyroid Function Tests: No results for input(s): TSH, T4TOTAL, FREET4, T3FREE, THYROIDAB in the last 72 hours. Anemia Panel: No results for input(s): VITAMINB12, FOLATE, FERRITIN, TIBC, IRON, RETICCTPCT in the last 72 hours. Sepsis Labs: No results for input(s): PROCALCITON, LATICACIDVEN in the last 168 hours.  Recent Results (from the past 240 hour(s))  SARS Coronavirus 2 by RT PCR (hospital order, performed in [facility] hospital lab) Nasopharyngeal Nasopharyngeal Swab     Status: None   Collection Time: 02/27/20  7:24 PM   Specimen: Nasopharyngeal Swab  Result Value Ref Range Status   SARS Coronavirus 2 NEGATIVE NEGATIVE Final    Comment: (NOTE) SARS-CoV-2 target nucleic acids are NOT DETECTED.  The SARS-CoV-2 RNA is generally detectable in upper and lower respiratory specimens during the acute phase of infection. The lowest concentration of SARS-CoV-2 viral copies this assay can detect is 250 copies / mL. A negative result does not preclude SARS-CoV-2 infection and should not be used as the sole basis for treatment or other patient management decisions.  A negative result may occur with improper specimen collection / handling, submission of specimen other than nasopharyngeal swab, presence of viral mutation(s) within the areas targeted by this assay, and inadequate number of viral copies (<250 copies / mL). A negative result must be combined with clinical observations, patient history, and epidemiological information.  Fact Sheet for Patients:   StrictlyIdeas.no  Fact  Sheet for Healthcare Providers: BankingDealers.co.za  This test is not yet approved or  cleared by the [place] FDA and has been authorized for detection and/or diagnosis of SARS-CoV-2 by FDA under an Emergency Use Authorization (EUA).  This EUA will remain in effect (meaning this test can be used) for the duration of the COVID-19 declaration under Section 564(b)(1)  of the Act, 21 U.S.C. section 360bbb-3(b)(1), unless the authorization is terminated or revoked sooner.  Performed at [facility], [address], [place], [place] [address]      RN Pressure Injury Documentation:     Estimated body mass index is 36.46 kg/m as calculated from the following:   Height as of this encounter: 5\' 7"  (1.702 m).   Weight as of this encounter: 105.6 kg.  Malnutrition Type:      Malnutrition Characteristics:      Nutrition Interventions:    Radiology Studies: No results found. Scheduled Meds:  OLANZapine zydis  5 mg Oral BID   Continuous Infusions:  sodium chloride     piperacillin-tazobactam (ZOSYN)  IV 3.375 g (03/02/20 0525)   sodium phosphate  Dextrose 5% IVPB      LOS: 3 days   03/04/20, DO [facility] PAGER is on AMION  If 7PM-7AM, please contact night-coverage [email or web address]

## 2020-03-03 DIAGNOSIS — K460 Unspecified abdominal hernia with obstruction, without gangrene: Secondary | ICD-10-CM

## 2020-03-03 NOTE — Progress Notes (Signed)
Attempted to restart IV but patient refused even after explained the rationale. Assigned RN made aware.

## 2020-03-03 NOTE — Progress Notes (Signed)
Patient pulled out IV.

## 2020-03-03 NOTE — Progress Notes (Signed)
   03/03/20 2020  Assess: MEWS Score  Temp 98.2 F (36.8 C)  BP (!) 158/115  Pulse Rate (!) 114  Resp 17  Level of Consciousness Alert  SpO2 99 %  O2 Device Room Air  Assess: MEWS Score  MEWS Temp 0  MEWS Systolic 0  MEWS Pulse 2  MEWS RR 0  MEWS LOC 0  MEWS Score 2  MEWS Score Color Yellow  Assess: if the MEWS score is Yellow or Red  Were vital signs taken at a resting state? Yes  Focused Assessment Documented focused assessment  Early Detection of Sepsis Score *See Row Information* Low  MEWS guidelines implemented *See Row Information* No, previously yellow, continue vital signs every 4 hours  Escalate  MEWS: Escalate Yellow: discuss with charge nurse/RN and consider discussing with provider and RRT  Notify: Charge Nurse/RN  Name of Charge Nurse/RN Notified Vera RN   Date Charge Nurse/RN Notified 03/03/20  Time Charge Nurse/RN Notified 2030  Document  Patient Outcome Other (Comment) (Pt refusing frequent VS )

## 2020-03-03 NOTE — Progress Notes (Signed)
Have arrived this morning to find night nurse and security in room trying to give pt a Haldol IM injection. This was completed. Pt has refused meds, VS; has pulled IV out, and continues to be difficult to persuade to be compliant. Sitter in room and pt is being monitored. MD aware of this continued behavior.

## 2020-03-03 NOTE — Progress Notes (Signed)
PROGRESS NOTE    Sally Wang  DQQ:[id number] DOB: 1961-04-21 DOA: 02/27/2020 PCP: Patient, No Pcp Per   Brief Narrative:  HPI per Dr. Midge Minium on 02/27/2020 Sally Wang is a 59 y.o. female with history of hypertension and schizoaffective disorder admitted and discharged from behavioral health on 02/05/2020 and as per the husband since the discharge has not been taking any of her medications was brought to the ER the patient has been having persistent nausea vomiting for the last 1 week.  Not sure when was her last bowel movement.  Patient has not had any chest pain shortness of breath or any fever chills.  At times the vomitus is blood-tinged.  During last admission patient was Covid positive.  ED Course: In the ER patient appears confused and at times talking hyperreligious.  Patient is not allowing completed physical exam.  Labs are significant for sodium 116 potassium 3 creatinine 1.2 lipase 66 with hemoglobin of 15.4 Covid test was negative.  Drug screen is negative EKG shows sinus tachycardia.  **Interim History Patient remained extremely manic schizoaffective disorder she has been noncompliant with her medications.  Psychiatry was consulted andrecommending considering Zyprexa Zydis 5 mg p.o. or IM twice daily and they recommend continuing one-to-one continuous observation monitoring and recommending psychiatric inpatient admission when she is medically cleared.    General Surgery evaluated and feels that she has incarcerated  hernia containing bowel that cannot be reduced.  She is at risk for bowel strangulation and eventual perforation without surgery and they discussed with the family and they understand and wish to proceed with the surgery which was done.  Patient sodium is improving slowly however she remained hyperreligious and extremely manic today in four-point restraints.  Yesterday 03/02/2020 her diet was advanced to regular diet.  She was be resumed on IV fluid hydration  with normal saline given that her sodium slightly dropped from 1 31-1 26.  Today she would not let us recheck labs and has pulled out her IV.  She refuses to talk to me today and had to be given Haldol this morning and had security called her.  It appears that her medical issues have been improved and we wanted to check her sodium however given her behavioral disturbances and with her sodium being above 125 for last 3 checks she is medically stable to be discharged to psychiatric facility where she wants further care given her significant mania as well as behavioral issues.  Assessment & Plan:   Principal Problem:   Hyponatremia Active Problems:   Schizoaffective disorder (HCC)   ARF (acute renal failure) (HCC)   Hypokalemia  Severe hyponatremia with hypokalemia and CKD stage III in the setting of significant dehydration, improving  -Suspect patient's hyponatremia could be from dehydration that patient has been having vomiting but her vomiting has now stopped.   -C/w gently hydrating thinking that patient could be probably dehydrated given the acute renal failure with nausea vomiting.  Nausea vomiting is now improved. -Sodium is improving and potassium is still on the lower side but will be replete; potassium is improved yesterday -Sodium was 131 up from 116 but dropped slightly and is now 126.  Will resume IV fluids with normal saline at 75 mL's per hour and will also give 10 mEq of IV sodium phosphate and this was given yesterday but patient will not let us recheck labs today and has pulled out her IV -Potassium was 4.0 yesterday but patient would not allow [place] to recheck  labs today -continue monitor repeat as necessary -We had resumed IV fluid hydration yesterday and IV fluid hydration is now stopped as patient is allowed her IV and will not last recheck labs -She appears to be medically stable for inpatient psychiatric facility and can have her CBC and CMP checked within 1 week  CKD stage  IIIa, improved Elevated Anion Gap, improved -Patient's BUNs/creatinine has been relatively stable despite getting IV fluid hydration which leads me to believe that she does have some evidence of renal dysfunction -Anion Gap is now 9 down from 16 -Patient BUN/creatinine is now 16/0.97 yesterday and is trended down from 48/1.20 on admission -Avoid further nephrotoxic medications, contrast dyes, hypotension and renally dose medications -She was resumed on IV fluid hydration yesterday because her sodium slightly dropped and is now be stopped given that she is tolerating her food without issues and because she will not let us recheck labs and has pulled out her IV. -Repeat CMP within 1 week  Hyponatremia/Hypochloremia -Slowly improving and sodium is gone from 116 on admission i and trended up to 131 but dropped to 126 yesterday but has not allowed [place] to recheck her labs today -Patient is chloride level has gone from 67is now 91 yesterday -Resumed IV fluid hydration with normal saline at 75 mils per hour plus IV sodium phosphate 10 mmol and she was given dose yesterday but this is now stopped given that she has pulled out her IV and because she refuses to check labs -Continue to monitor and trend -Repeat CMP within 1 week  Elevated ALT -Patient's ALT has been mildly elevated and peaked at 50 and today it is 39 -On presentation it was 45 -Continue monitor and trend and if continues to persistently be elevated will obtain a right upper quadrant ultrasound as well as a acute hepatitis panel  -Repeat CMP in 1 week  Leukocytosis -Patient's WBC went from 10.9 -> 6.5 -> 9.7 -> 6.4 and is now improved and she would not let us check labs today -Continue to monitor for signs and symptoms of infection; likely in the setting of her incarcerated hernia -Repeat CBC within 1 week  Hypokalemia -See above  -Patient's potassium was 4.0 yesterday -Magnesium level was 2.1 yesterday -Continue to monitor and  replete as necessary -Repeat CMP within 1 week  Nausea and Vomiting, improved  -Cause not clear but likley be incarcerated hernia as the CT of the abdomen pelvis showed a proximal partial small bowel obstruction to the level of the anterior umbilical hernia findings that could be suggestive of focal bowel incarceration with the remainder of the distal ileal loops being decompressed -General surgery was consulted and recommending n.p.o. and fluid hydration prior to her surgical intervention -Because she continues to have an incarcerated hernia containing bowel which can her be reduced she is at risk for bowel strangulation and omental perforation without surgery -She has been IVC but did not have the medical decision mental capacity to make decisions about surgery but family is active on her behalf and consented for surgical procedure -She is postoperative day 1 and now on sips with chips of ice  Hypertension  -Does not take any medication as per the patient has been medically patient.  IV hydralazine  Schizoaffective Disorder  -Has not been taking any of her medications since last discharge on 02/05/2020 and has been refusing them now -She is currently extremely manic and psychiatry been consulted.  She has been IVC'd has been placed in four-point restraints intermittently.  She continues remove her IVs -Psychiatry recommends inpatient psychiatric hospitalization once she is medically cleared and improved and after she had her surgery; surgery has now cleared the patient from their standpoint and recommending having the staples remain for 10 days and removed depending on the final disposition.  She is also medically cleared from my perspective to go to a psychiatric facility given that she needs more acute treatment of her psychiatric issues and that her medical issues are significantly improved with her sodium trending upwards with IV fluid hydration  Obesity -Estimated body mass index is 36.46  kg/m as calculated from the following:   Height as of this encounter: 5\' 7"  (1.702 m).   Weight as of this encounter: 105.6 kg. -Continued Weight Loss and Dietary Counseling   DVT prophylaxis: SCDs Code Status: FULL CODE  Family Communication: No family present at bedside  Disposition Plan: Transfer to psychiatric facility now that she is medically stable  Status is: Inpatient  Remains inpatient appropriate because:Persistent severe electrolyte disturbances, Unsafe d/c plan, IV treatments appropriate due to intensity of illness or inability to take PO and Inpatient level of care appropriate due to severity of illness   Dispo: The patient is from: Home              [facility]              Anticipated d/c date is: 1 day              Patient currently is medically stable to d/c.    Consultants:  General Surgery Psychiatry    Procedures: Repair of incarcerated umbilical hernia   Antimicrobials:  Anti-infectives (From admission, onward)   Start     Dose/Rate Route Frequency Ordered Stop   02/28/20 0230  piperacillin-tazobactam (ZOSYN) IVPB 3.375 g     Discontinue     3.375 g 12.5 mL/hr over 240 Minutes Intravenous Every 8 hours 02/28/20 0201       Subjective: Seen at bedside and she continues to have significant behavioral issues.  Security had been called on her this morning and she was given IM Haldol.  She is refusing all her lab work and when I walked into the room she is refusing to speak with me and answer any questions.  She purposely is ignoring me and refuses to answer any further questions.  She has removed her IV and will not let us check blood work.  Because her sodium has been above 125 for last 3 checks likely would have gone up in the setting of the IV fluid hydration that she was given yesterday.  She is medically stable to be discharged to inpatient behavioral health given her persistent mania and behavioral disturbances now.   Objective: Vitals:     03/01/20 1802 03/01/20 2221 03/02/20 0613 03/03/20 0652  BP: (!) 162/103 138/78 (!) 146/93 (!) 187/115  Pulse: (!) 119 (!) 110 89 (!) 114  Resp: 16 (!) 22 18 (!) 22  Temp:   97.9 F (36.6 C) 98.4 F (36.9 C)  TempSrc: Oral Oral Oral Oral  SpO2: 100% 98% 100% 99%  Weight:   105.6 kg   Height:        Intake/Output Summary (Last 24 hours) at 03/03/2020 1308 Last data filed at 03/03/2020 1000 Gross per 24 hour  Intake 2065.49 ml  Output 700 ml  Net 1365.49 ml   Filed Weights   02/29/20 0500 03/01/20 0334 03/02/20 0613  Weight: 100.2 kg 100  kg 105.6 kg   Examination: Physical Exam:  Constitutional: WN/WD African-American female currently in NAD and appears calm and she refuses to answer any questions Eyes: Lids and conjunctivae normal, sclerae anicteric  ENMT: External Ears, Nose appear normal. Grossly normal hearing.  Neck: Appears normal, supple, no cervical masses, normal ROM, no appreciable thyromegaly; no JVD Respiratory: Diminished to auscultation bilaterally, no wheezing, rales, rhonchi or crackles. Normal respiratory effort and patient is not tachypenic. No accessory muscle use.  Unlabored breathing Cardiovascular: RRR, no murmurs / rubs / gallops. S1 and S2 auscultated. No extremity edema.  Abdomen: Soft, tender to palpate, distended secondary body habitus. Bowel sounds positive.  GU: Deferred. Musculoskeletal: No clubbing / cyanosis of digits/nails. No joint deformity upper and lower extremities.  Skin: No rashes, lesions, ulcers on limited skin evaluation. No induration; Warm and dry.  Neurologic: She refuses to speak to me and does not want to follow commands for neurological examination. Psychiatric: Impaired judgment and insight.  She refuses to speak with me   Data Reviewed: I have personally reviewed following labs and imaging studies  CBC: Recent Labs  Lab 02/27/20 1924 02/28/20 0039 02/29/20 0829 03/01/20 0416 03/02/20 0905  WBC 9.7 10.9* 6.5 9.7 6.4   NEUTROABS 7.0 8.2* 4.3 8.2* 4.1  HGB 15.4* 15.0 14.8 13.7 12.9  HCT 43.9 42.7 43.5 41.9 39.4  MCV 80.3 80.4 82.4 84.3 86.4  PLT 385 339 308 247 194   Basic Metabolic Panel: Recent Labs  Lab 02/28/20 1928 02/29/20 0121 02/29/20 0829 03/01/20 0416 03/02/20 0905  NA 121* 121* 127* 131* 126*  K 3.2* 3.1* 3.2* 3.4* 4.0  CL 75* 77* 82* 90* 91*  CO2 30 27 31 25 26   GLUCOSE 98 92 93 93 85  BUN 40* 34* 29* 19 16  CREATININE 1.12* 1.07* 1.17* 1.01* 0.97  CALCIUM 8.8* 8.5* 8.9 8.8* 8.1*  MG  --   --  2.5* 2.3 2.1  PHOS  --   --  3.6 3.6 2.4*   GFR: Estimated Creatinine Clearance: 79 mL/min (by C-G formula based on SCr of 0.97 mg/dL). Liver Function Tests: Recent Labs  Lab 02/28/20 1454 02/28/20 1928 02/29/20 0829 03/01/20 0416 03/02/20 0905  AST 35 35 28 25 21   ALT 50* 49* 48* 47* 39  ALKPHOS 87 83 84 70 69  BILITOT 0.9 1.0 1.0 1.1 0.4  PROT 7.5 7.3 6.9 6.8 6.1*  ALBUMIN 3.7 3.6 3.3* 3.4* 3.1*   Recent Labs  Lab 02/27/20 1924  LIPASE 66*   No results for input(s): AMMONIA in the last 168 hours. Coagulation Profile: No results for input(s): INR, PROTIME in the last 168 hours. Cardiac Enzymes: No results for input(s): CKTOTAL, CKMB, CKMBINDEX, TROPONINI in the last 168 hours. BNP (last 3 results) No results for input(s): PROBNP in the last 8760 hours. HbA1C: No results for input(s): HGBA1C in the last 72 hours. CBG: No results for input(s): GLUCAP in the last 168 hours. Lipid Profile: No results for input(s): CHOL, HDL, LDLCALC, TRIG, CHOLHDL, LDLDIRECT in the last 72 hours. Thyroid Function Tests: No results for input(s): TSH, T4TOTAL, FREET4, T3FREE, THYROIDAB in the last 72 hours. Anemia Panel: No results for input(s): VITAMINB12, FOLATE, FERRITIN, TIBC, IRON, RETICCTPCT in the last 72 hours. Sepsis Labs: No results for input(s): PROCALCITON, LATICACIDVEN in the last 168 hours.  Recent Results (from the past 240 hour(s))  SARS Coronavirus 2 by RT PCR  (hospital order, performed in [facility] hospital lab) Nasopharyngeal Nasopharyngeal Swab  Status: None   Collection Time: 02/27/20  7:24 PM   Specimen: Nasopharyngeal Swab  Result Value Ref Range Status   SARS Coronavirus 2 NEGATIVE NEGATIVE Final    Comment: (NOTE) SARS-CoV-2 target nucleic acids are NOT DETECTED.  The SARS-CoV-2 RNA is generally detectable in upper and lower respiratory specimens during the acute phase of infection. The lowest concentration of SARS-CoV-2 viral copies this assay can detect is 250 copies / mL. A negative result does not preclude SARS-CoV-2 infection and should not be used as the sole basis for treatment or other patient management decisions.  A negative result may occur with improper specimen collection / handling, submission of specimen other than nasopharyngeal swab, presence of viral mutation(s) within the areas targeted by this assay, and inadequate number of viral copies (<250 copies / mL). A negative result must be combined with clinical observations, patient history, and epidemiological information.  Fact Sheet for Patients:   BoilerBrush.com.cy  Fact Sheet for Healthcare Providers: https://pope.com/  This test is not yet approved or  cleared by the [place] FDA and has been authorized for detection and/or diagnosis of SARS-CoV-2 by FDA under an Emergency Use Authorization (EUA).  This EUA will remain in effect (meaning this test can be used) for the duration of the COVID-19 declaration under Section 564(b)(1) of the Act, 21 U.S.C. section 360bbb-3(b)(1), unless the authorization is terminated or revoked sooner.  Performed at [facility], [address] [place], [place] [address]      RN Pressure Injury Documentation:     Estimated body mass index is 36.46 kg/m as calculated from the following:   Height as of this encounter: 5\' 7"  (1.702 m).   Weight as  of this encounter: 105.6 kg.  Malnutrition Type:      Malnutrition Characteristics:      Nutrition Interventions:    Radiology Studies: No results found. Scheduled Meds:  OLANZapine zydis  5 mg Oral BID   Continuous Infusions:  sodium chloride Stopped (03/03/20 0645)   piperacillin-tazobactam (ZOSYN)  IV 3.375 g (03/03/20 0536)    LOS: 4 days   03/05/20, DO [facility] PAGER is on AMION  If 7PM-7AM, please contact night-coverage [email or web address]

## 2020-03-03 NOTE — Progress Notes (Signed)
Patient has refused all vitals, hygiene care and po med.

## 2020-03-04 ENCOUNTER — Encounter (HOSPITAL_COMMUNITY): Payer: Self-pay | Admitting: Internal Medicine

## 2020-03-04 DIAGNOSIS — F259 Schizoaffective disorder, unspecified: Secondary | ICD-10-CM

## 2020-03-04 DIAGNOSIS — I10 Essential (primary) hypertension: Secondary | ICD-10-CM

## 2020-03-04 MED ORDER — ONDANSETRON HCL 4 MG PO TABS: 4.0000 mg | ORAL_TABLET | Freq: Four times a day (QID) | ORAL | 0 refills | Status: DC | PRN

## 2020-03-04 MED ORDER — AMLODIPINE BESYLATE 5 MG PO TABS: 10.0000 mg | ORAL_TABLET | Freq: Every day | ORAL | 0 refills | Status: DC

## 2020-03-04 MED ORDER — OLANZAPINE 5 MG PO TBDP: 5.0000 mg | ORAL_TABLET | Freq: Two times a day (BID) | ORAL | 0 refills | Status: DC

## 2020-03-04 MED ORDER — HALOPERIDOL DECANOATE 100 MG/ML IM SOLN
100.0000 mg | Freq: Once | INTRAMUSCULAR | Status: AC
  Administered 2020-03-04: 100 mg via INTRAMUSCULAR
  Filled 2020-03-04: qty 1

## 2020-03-04 MED ORDER — HYDRALAZINE HCL 25 MG PO TABS: 25.0000 mg | ORAL_TABLET | Freq: Three times a day (TID) | ORAL | Status: DC | PRN

## 2020-03-04 NOTE — Final Consult Note (Signed)
Consultant Final Sign-Off Note    Assessment/Final recommendations  Sally Wang is a 59 y.o. female followed by me for incarcerated umbilical hernia s/p repair of incarcerated umbilical hernia by Dr. Ninfa Linden on 02/29/2020.   Wound care (if applicable): Staples out POD #10 (~6/27)   Diet at discharge: per primary team   Activity at discharge: Do not lift anything greater than 10 lbs for the next 6 weeks   Follow-up appointment:  Dr. Ninfa Linden. Will need nurse visit for staple removal if not removed prior to discharge.    Pending results:  Unresulted Labs (From admission, onward) Comment          Start     Ordered   03/03/20 0500  CBC with Differential/Platelet  Tomorrow morning,   R        03/02/20 1323           Medication recommendations:   Other recommendations:    Thank you for allowing [place] to participate in the care of your patient!  Please consult [place] again if you have further needs for your patient.  Barth Kirks [facility] 03/04/2020 8:27 AM    Subjective   Able to tell me that she is not having any abdominal pain and she is tolerating a diet.   Objective  Vital signs in last 24 hours: Temp:  [98.1 F (36.7 C)-98.2 F (36.8 C)] 98.1 F (36.7 C) (06/21 0602) Pulse Rate:  [93-114] 93 (06/21 0602) Resp:  [17] 17 (06/20 2020) BP: (156-158)/(101-115) 156/101 (06/21 0602) SpO2:  [99 %] 99 % (06/21 0602)  Gen: Awake and alert, NAD Lungs: Normal rate and effort Abd; Soft, NT, +BS, incision with staples in place c/d/i  Pertinent labs and Studies: Recent Labs    03/02/20 0905  WBC 6.4  HGB 12.9  HCT 39.4   BMET Recent Labs    03/02/20 0905  NA 126*  K 4.0  CL 91*  CO2 26  GLUCOSE 85  BUN 16  CREATININE 0.97  CALCIUM 8.1*   No results for input(s): LABURIN in the last 72 hours. Results for orders placed or performed during the hospital encounter of 02/27/20  SARS Coronavirus 2 by RT PCR (hospital order, performed in [facility] hospital lab)  Nasopharyngeal Nasopharyngeal Swab     Status: None   Collection Time: 02/27/20  7:24 PM   Specimen: Nasopharyngeal Swab  Result Value Ref Range Status   SARS Coronavirus 2 NEGATIVE NEGATIVE Final    Comment: (NOTE) SARS-CoV-2 target nucleic acids are NOT DETECTED.  The SARS-CoV-2 RNA is generally detectable in upper and lower respiratory specimens during the acute phase of infection. The lowest concentration of SARS-CoV-2 viral copies this assay can detect is 250 copies / mL. A negative result does not preclude SARS-CoV-2 infection and should not be used as the sole basis for treatment or other patient management decisions.  A negative result may occur with improper specimen collection / handling, submission of specimen other than nasopharyngeal swab, presence of viral mutation(s) within the areas targeted by this assay, and inadequate number of viral copies (<250 copies / mL). A negative result must be combined with clinical observations, patient history, and epidemiological information.  Fact Sheet for Patients:   StrictlyIdeas.no  Fact Sheet for Healthcare Providers: BankingDealers.co.za  This test is not yet approved or  cleared by the [place] FDA and has been authorized for detection and/or diagnosis of SARS-CoV-2 by FDA under an Emergency Use Authorization (EUA).  This EUA will remain  in effect (meaning this test can be used) for the duration of the COVID-19 declaration under Section 564(b)(1) of the Act, 21 U.S.C. section 360bbb-3(b)(1), unless the authorization is terminated or revoked sooner.  Performed at [facility], [address] [place], [place] [address]     Imaging: No results found.

## 2020-03-04 NOTE — Care Management Important Message (Signed)
Important Message  Patient Details IM Letter given to Amada Jupiter SW Case Manager to present to the Patient Name: Sally Wang MRN: [id number] Date of Birth: 1961-08-07   Medicare Important Message Given:  Yes     Caren Macadam 03/04/2020, 10:40 AM

## 2020-03-04 NOTE — Progress Notes (Signed)
PROGRESS NOTE    [name]  UMP:[id number] DOB: November 01, 1960 DOA: 02/27/2020 PCP: Patient, No Pcp Per   Brief Narrative:  HPI per Dr. Gean Birchwood on 02/27/2020 Sally Wang is a 59 y.o. female with history of hypertension and schizoaffective disorder admitted and discharged from behavioral health on 02/05/2020 and as per the husband since the discharge has not been taking any of her medications was brought to the ER the patient has been having persistent nausea vomiting for the last 1 week.  Not sure when was her last bowel movement.  Patient has not had any chest pain shortness of breath or any fever chills.  At times the vomitus is blood-tinged.  During last admission patient was Covid positive.  ED Course: In the ER patient appears confused and at times talking hyperreligious.  Patient is not allowing completed physical exam.  Labs are significant for sodium 116 potassium 3 creatinine 1.2 lipase 66 with hemoglobin of 15.4 Covid test was negative.  Drug screen is negative EKG shows sinus tachycardia.  **Interim History Patient remained extremely manic schizoaffective disorder she has been noncompliant with her medications.  Psychiatry was consulted andrecommending considering Zyprexa Zydis 5 mg p.o. or IM twice daily and they recommend continuing one-to-one continuous observation monitoring and recommending psychiatric inpatient admission when she is medically cleared.    General Surgery evaluated and feels that she has incarcerated  hernia containing bowel that cannot be reduced.  She is at risk for bowel strangulation and eventual perforation without surgery and they discussed with the family and they understand and wish to proceed with the surgery which was done.  Patient sodium is improving slowly however she remained hyperreligious and extremely manic today in four-point restraints.  On 03/02/2020 her diet was advanced to regular diet.  She was be resumed on IV fluid hydration with  normal saline given that her sodium slightly dropped from 131 -> 126. She would not let us recheck labs and has pulled out her IV.   It appears that her medical issues have been improved and we wanted to check her sodium however given her behavioral disturbances and with her sodium being above 125 for last 3 checks she is medically stable to be discharged to psychiatric facility where she wants further care given her significant mania as well as behavioral issues.  6/21: Calmer today but still has flight of ideas, pressured speech and is very hyperreligious and manic.  She is not as agitated and actually spoke with me today as opposed yesterday when she would not answer any my questions.  She has been deemed medically stable to transfer to psychiatric facility however the [facility] team informing that she needs a reconsult for psychiatry and so I placed this and awaiting reevaluation.  Assessment & Plan:   Principal Problem:   Hyponatremia Active Problems:   Schizoaffective disorder (HCC)   ARF (acute renal failure) (HCC)   Hypokalemia  Severe hyponatremia with hypokalemia and CKD stage III in the setting of significant dehydration, improving  -Suspect patient's hyponatremia could be from dehydration that patient has been having vomiting but her vomiting has now stopped.   -C/w gently hydrating thinking that patient could be probably dehydrated given the acute renal failure with nausea vomiting.  Nausea vomiting is now improved. -Sodium is improving and potassium is still on the lower side but will be replete; potassium is improved yesterday -Sodium was 131 up from 116 but dropped slightly and is now 126 on last check. Has  removed IV and will not allow [place] to repeat her labs -Potassium was 4.0 the day before yesterday but patient would not allow [place] to recheck labs today -continue monitor repeat as necessary -We had resumed IV fluid hydration yesterday and IV fluid hydration is now stopped as patient  is allowed her IV and will not last recheck labs -She is medically stable for inpatient psychiatric facility and can have her CBC and CMP checked within 1 week  CKD stage IIIa, improved Elevated Anion Gap, improved -Patient's BUNs/creatinine has been relatively stable despite getting IV fluid hydration which leads me to believe that she does have some evidence of renal dysfunction -Anion Gap is now 9 down from 16 -Patient BUN/creatinine is now 16/0.97 yesterday and is trended down from 48/1.20 on admission -Avoid further nephrotoxic medications, contrast dyes, hypotension and renally dose medications -She was resumed on IV fluid hydration yesterday because her sodium slightly dropped and is now be stopped given that she is tolerating her food without issues and because she will not let us recheck labs and has pulled out her IV. -Repeat CMP within 1 week  Hyponatremia/Hypochloremia -Slowly improving and sodium is gone from 116 on admission i and trended up to 131 but dropped to 126 yesterday but has not allowed [place] to recheck her labs today -Patient is chloride level has gone from 67is now 91 yesterday -Resumed IV fluid hydration with normal saline at 75 mils per hour plus IV sodium phosphate 10 mmol and she was given dose yesterday but this is now stopped given that she has pulled out her IV and because she refuses to check labs -Continue to monitor and trend -Repeat CMP within 1 week  Elevated ALT -Patient's ALT has been mildly elevated and peaked at 50 and on last check was 39 -On presentation it was 45 -Continue monitor and trend and if continues to persistently be elevated will obtain a right upper quadrant ultrasound as well as a acute hepatitis panel  -Repeat CMP in 1 week  Leukocytosis -Patient's WBC went from 10.9 -> 6.5 -> 9.7 -> 6.4 and is now improved and she would not let us check labs today -Continue to monitor for signs and symptoms of infection; likely in the setting of her  incarcerated hernia -Repeat CBC within 1 week  Hypokalemia -See above  -Patient's potassium was 4.0 the day before yesterday -Magnesium level was 2.1 the day before yesterday -Continue to monitor and replete as necessary -Repeat CMP within 1 week  Nausea and Vomiting, improved  -Cause not clear but likley be incarcerated hernia as the CT of the abdomen pelvis showed a proximal partial small bowel obstruction to the level of the anterior umbilical hernia findings that could be suggestive of focal bowel incarceration with the remainder of the distal ileal loops being decompressed -General surgery was consulted and recommending n.p.o. and fluid hydration prior to her surgical intervention -Because she continues to have an incarcerated hernia containing bowel which can her be reduced she is at risk for bowel strangulation and omental perforation without surgery -She has been IVC but did not have the medical decision mental capacity to make decisions about surgery but family is active on her behalf and consented for surgical procedure -She is postoperative day 1 and now on sips with chips of ice  Hypertension  -Does not take any medication as per the patient has been medically patient.  IV hydralazine if needed but removed her IV -will change to po Hydralazine 25 mg  po q8hprn for SBP>150 or DBP>100 -BP was 156/101  Schizoaffective Disorder  -Has not been taking any of her medications since last discharge on 02/05/2020 and has been refusing them now -She is currently extremely manic and psychiatry been consulted.  She has been IVC'd has been placed in four-point restraints intermittently.  She continues remove her IVs -Psychiatry recommends inpatient psychiatric hospitalization once she is medically cleared and improved and after she had her surgery; surgery has now cleared the patient from their standpoint and recommending having the staples remain for 10 days and removed depending on the final  disposition.  She is also medically cleared from my perspective to go to a psychiatric facility given that she needs more acute treatment of her psychiatric issues and that her medical issues are significantly improved with her sodium trending upwards with IV fluid hydration  Obesity -Estimated body mass index is 36.46 kg/m as calculated from the following:   Height as of this encounter: 5\' 7"  (1.702 m).   Weight as of this encounter: 105.6 kg. -Continued Weight Loss and Dietary Counseling   DVT prophylaxis: SCDs Code Status: FULL CODE  Family Communication: No family present at bedside  Disposition Plan: Transfer to psychiatric facility now that she is medically stable; Have asked TOC with assistant with placement.   Status is: Inpatient  Remains inpatient appropriate because:Persistent severe electrolyte disturbances, Unsafe d/c plan, IV treatments appropriate due to intensity of illness or inability to take PO and Inpatient level of care appropriate due to severity of illness   Dispo: The patient is from: Home              Anticipated d/c is to: [facility]              Anticipated d/c date is: 1 day              Patient currently is medically stable to d/c. She will need Psychiatric Hospitalization   Consultants:  General Surgery Psychiatry    Procedures: Repair of incarcerated umbilical hernia   Antimicrobials:  Anti-infectives (From admission, onward)   Start     Dose/Rate Route Frequency Ordered Stop   02/28/20 0230  piperacillin-tazobactam (ZOSYN) IVPB 3.375 g     Discontinue     3.375 g 12.5 mL/hr over 240 Minutes Intravenous Every 8 hours 02/28/20 0201       Subjective: Seen at bedside and she she was calmer today compared to yesterday but still has flight of ideas, tangential and pressured speech, and continues to be hyperreligious.  Tolerating her diet and not complaining of pain as she states that "God would not allow me pain".  She is medically stable to be transferred  to a psychiatric facility and will need to follow-up with psychiatry and repeat blood work within 1 week.  Surgery has signed off the case and recommending removing staples postoperative day 10.  Objective: Vitals:   03/02/20 0613 03/03/20 0652 03/03/20 2020 03/04/20 0602  BP: (!) 146/93 (!) 187/115 (!) 158/115 (!) 156/101  Pulse: 89 (!) 114 (!) 114 93  Resp: 18 (!) 22 17   Temp: 97.9 F (36.6 C) 98.4 F (36.9 C) 98.2 F (36.8 C) 98.1 F (36.7 C)  TempSrc: Oral Oral Oral Oral  SpO2: 100% 99% 99% 99%  Weight: 105.6 kg     Height:        Intake/Output Summary (Last 24 hours) at 03/04/2020 1116 Last data filed at 03/03/2020 1846 Gross per 24 hour  Intake 360  ml  Output --  Net 360 ml   Filed Weights   02/29/20 0500 03/01/20 0334 03/02/20 9485  Weight: 100.2 kg 100 kg 105.6 kg   Examination: Physical Exam:  Constitutional: WN/WD obese African-American female in NAD and appears calm and comfortable and is tolerating her diet Respiratory: Diminished to auscultation bilaterally, no wheezing, rales, rhonchi or crackles. Normal respiratory effort and patient is not tachypenic. No accessory muscle use.  Cardiovascular: RRR, no murmurs / rubs / gallops. S1 and S2 auscultated. Abdomen: Soft, non-tender, distended due to body habitus. Bowel sounds positive.  Midline incision in her umbilicus with staples that are clean dry and intact GU: Deferred. Neurologic: CN 2-12 grossly intact with no focal deficits. Romberg sign and cerebellar reflexes not assessed.  Psychiatric: Impaired judgment and insight.  Awake and alert but she is very manic and has flight of ideas and pressured speech.    Data Reviewed: I have personally reviewed following labs and imaging studies  CBC: Recent Labs  Lab 02/27/20 1924 02/28/20 0039 02/29/20 0829 03/01/20 0416 03/02/20 0905  WBC 9.7 10.9* 6.5 9.7 6.4  NEUTROABS 7.0 8.2* 4.3 8.2* 4.1  HGB 15.4* 15.0 14.8 13.7 12.9  HCT 43.9 42.7 43.5 41.9 39.4  MCV  80.3 80.4 82.4 84.3 86.4  PLT 385 339 308 247 194   Basic Metabolic Panel: Recent Labs  Lab 02/28/20 1928 02/29/20 0121 02/29/20 0829 03/01/20 0416 03/02/20 0905  NA 121* 121* 127* 131* 126*  K 3.2* 3.1* 3.2* 3.4* 4.0  CL 75* 77* 82* 90* 91*  CO2 30 27 31 25 26   GLUCOSE 98 92 93 93 85  BUN 40* 34* 29* 19 16  CREATININE 1.12* 1.07* 1.17* 1.01* 0.97  CALCIUM 8.8* 8.5* 8.9 8.8* 8.1*  MG  --   --  2.5* 2.3 2.1  PHOS  --   --  3.6 3.6 2.4*   GFR: Estimated Creatinine Clearance: 79 mL/min (by C-G formula based on SCr of 0.97 mg/dL). Liver Function Tests: Recent Labs  Lab 02/28/20 1454 02/28/20 1928 02/29/20 0829 03/01/20 0416 03/02/20 0905  AST 35 35 28 25 21   ALT 50* 49* 48* 47* 39  ALKPHOS 87 83 84 70 69  BILITOT 0.9 1.0 1.0 1.1 0.4  PROT 7.5 7.3 6.9 6.8 6.1*  ALBUMIN 3.7 3.6 3.3* 3.4* 3.1*   Recent Labs  Lab 02/27/20 1924  LIPASE 66*   No results for input(s): AMMONIA in the last 168 hours. Coagulation Profile: No results for input(s): INR, PROTIME in the last 168 hours. Cardiac Enzymes: No results for input(s): CKTOTAL, CKMB, CKMBINDEX, TROPONINI in the last 168 hours. BNP (last 3 results) No results for input(s): PROBNP in the last 8760 hours. HbA1C: No results for input(s): HGBA1C in the last 72 hours. CBG: No results for input(s): GLUCAP in the last 168 hours. Lipid Profile: No results for input(s): CHOL, HDL, LDLCALC, TRIG, CHOLHDL, LDLDIRECT in the last 72 hours. Thyroid Function Tests: No results for input(s): TSH, T4TOTAL, FREET4, T3FREE, THYROIDAB in the last 72 hours. Anemia Panel: No results for input(s): VITAMINB12, FOLATE, FERRITIN, TIBC, IRON, RETICCTPCT in the last 72 hours. Sepsis Labs: No results for input(s): PROCALCITON, LATICACIDVEN in the last 168 hours.  Recent Results (from the past 240 hour(s))  SARS Coronavirus 2 by RT PCR (hospital order, performed in [facility] hospital lab) Nasopharyngeal Nasopharyngeal Swab     Status: None     Collection Time: 02/27/20  7:24 PM   Specimen: Nasopharyngeal Swab  Result Value  Ref Range Status   SARS Coronavirus 2 NEGATIVE NEGATIVE Final    Comment: (NOTE) SARS-CoV-2 target nucleic acids are NOT DETECTED.  The SARS-CoV-2 RNA is generally detectable in upper and lower respiratory specimens during the acute phase of infection. The lowest concentration of SARS-CoV-2 viral copies this assay can detect is 250 copies / mL. A negative result does not preclude SARS-CoV-2 infection and should not be used as the sole basis for treatment or other patient management decisions.  A negative result may occur with improper specimen collection / handling, submission of specimen other than nasopharyngeal swab, presence of viral mutation(s) within the areas targeted by this assay, and inadequate number of viral copies (<250 copies / mL). A negative result must be combined with clinical observations, patient history, and epidemiological information.  Fact Sheet for Patients:   BoilerBrush.com.cyhttps://www.fda.gov/media/136312/download  Fact Sheet for Healthcare Providers: https://pope.com/https://www.fda.gov/media/136313/download  This test is not yet approved or  cleared by the [place] FDA and has been authorized for detection and/or diagnosis of SARS-CoV-2 by FDA under an Emergency Use Authorization (EUA).  This EUA will remain in effect (meaning this test can be used) for the duration of the COVID-19 declaration under Section 564(b)(1) of the Act, 21 U.S.C. section 360bbb-3(b)(1), unless the authorization is terminated or revoked sooner.  Performed at [facility], [address] [place], [place] [address]      RN Pressure Injury Documentation:     Estimated body mass index is 36.46 kg/m as calculated from the following:   Height as of this encounter: 5\' 7"  (1.702 m).   Weight as of this encounter: 105.6 kg.  Malnutrition Type:      Malnutrition Characteristics:       Nutrition Interventions:    Radiology Studies: No results found. Scheduled Meds: . OLANZapine zydis  5 mg Oral BID   Continuous Infusions: . sodium chloride Stopped (03/03/20 0645)  . piperacillin-tazobactam (ZOSYN)  IV 3.375 g (03/03/20 0536)    LOS: 5 days   Merlene Laughtermair Latif Tamsin Nader, DO [facility] P[facility]ER is on AMION  If 7PM-7AM, please contact night-coverage [email or web address]

## 2020-03-04 NOTE — TOC Initial Note (Signed)
Transition of Care [facility]) - Initial/Assessment Note    Patient Details  Name: Sally Wang MRN: [id number] Date of Birth: [birth year]  [facility]) CM/SW Contact:    Amada Jupiter, LCSW Phone Number: 03/04/2020, 10:11 AM  Clinical Narrative:     Have spoken with pt's spouse, Sally Wang, who is aware that MD feels pt is medially stable to transition to inpatient psychiatric program.  Husband in full agreement and feels "she was let go to soon last time" [facility] d/c 5/24).  He is also aware pt is currently under IVC which will need renewal tomorrow.  At this time, await psychiatric consult/ input.              Expected Discharge Plan: Psychiatric Hospital Barriers to Discharge: Continued Medical Work up   Patient Goals and CMS Choice Patient states their goals for this hospitalization and ongoing recovery are:: unable      Expected Discharge Plan and Services Expected Discharge Plan: Psychiatric Hospital       Living arrangements for the past 2 months: Single Family Home                                      Prior Living Arrangements/Services Living arrangements for the past 2 months: Single Family Home Lives with:: Spouse Patient language and need for interpreter reviewed:: Yes Do you feel safe going back to the place where you live?:  (unable to answer due to manic state)      Need for Family Participation in Patient Care: Yes (Comment) Care giver support system in place?: Yes (comment)   Criminal Activity/Legal Involvement Pertinent to Current Situation/Hospitalization: No - Comment as needed  Activities of Daily Living Home Assistive Devices/Equipment: None ADL Screening (condition at time of admission) Patient's cognitive ability adequate to safely complete daily activities?: No Is the patient deaf or have difficulty hearing?: No Does the patient have difficulty seeing, even when wearing glasses/contacts?: No Does the patient have difficulty  concentrating, remembering, or making decisions?: No Patient able to express need for assistance with ADLs?: Yes Does the patient have difficulty dressing or bathing?: No Independently performs ADLs?: Yes (appropriate for developmental age) Does the patient have difficulty walking or climbing stairs?: No Weakness of Legs: None Weakness of Arms/Hands: None  Permission Sought/Granted                  Emotional Assessment Appearance:: Appears stated age Attitude/Demeanor/Rapport: Unable to Assess   Orientation: : Oriented to Self Alcohol / Substance Use: Not Applicable Psych Involvement: Yes (comment)  Admission diagnosis:  Hyponatremia [E87.1] Patient Active Problem List   Diagnosis Date Noted  . Hyponatremia 02/27/2020  . ARF (acute renal failure) (HCC) 02/27/2020  . Hypokalemia 02/27/2020  . Schizoaffective disorder (HCC)   . OBESITY 01/03/2009  . Uncontrolled hypertension 06/17/2007   PCP:  Patient, No Pcp Per Pharmacy:   [id number] - [place]), [place] - [address] [place] [address] Phone: [phone] Fax: [phone]  [facility] [address]), [place] - [address] [place] ([place]) [place] [address] Phone: [phone] Fax: [phone]  [id number] [place], [place] - [address] [place] [address] Phone: [phone] Fax: [phone]     Social Determinants of Health (SDOH) Interventions  Readmission Risk Interventions No flowsheet data found.

## 2020-03-04 NOTE — Plan of Care (Signed)

## 2020-03-04 NOTE — Discharge Instructions (Signed)
[facility] _______[facility] ° °UMBILICAL OR INGUINAL HERNIA REPAIR: POST OP INSTRUCTIONS ° °Always review your discharge instruction sheet given to you by the facility where your surgery was performed. °IF YOU HAVE DISABILITY OR FAMILY LEAVE FORMS, YOU MUST BRING THEM TO THE OFFICE FOR PROCESSING.   °DO NOT GIVE THEM TO YOUR DOCTOR. ° °1. A  prescription for pain medication may be given to you upon discharge.  Take your pain medication as prescribed, if needed.  If narcotic pain medicine is not needed, then you may take acetaminophen (Tylenol) or ibuprofen (Advil) as needed. °2. Take your usually prescribed medications unless otherwise directed. °If you need a refill on your pain medication, please contact your pharmacy.  They will contact our office to request authorization. Prescriptions will not be filled after 5 pm or on week-ends. °3. You should follow a light diet the first 24 hours after arrival home, such as soup and crackers, etc.  Be sure to include lots of fluids daily.  Resume your normal diet the day after surgery. °4.Most patients will experience some swelling and bruising around the umbilicus or in the groin and scrotum.  Ice packs and reclining will help.  Swelling and bruising can take several days to resolve.  °6. It is common to experience some constipation if taking pain medication after surgery.  Increasing fluid intake and taking a stool softener (such as Colace) will usually help or prevent this problem from occurring.  A mild laxative (Milk of Magnesia or Miralax) should be taken according to package directions if there are no bowel movements after 48 hours. °7. Unless discharge instructions indicate otherwise, you may remove your bandages 24-48 hours after surgery, and you may shower at that time.  You may have steri-strips (small skin tapes) in place directly over the incision.  These strips should be left on the skin for 7-10 days.  If your surgeon used skin glue on the  incision, you may shower in 24 hours.  The glue will flake off over the next 2-3 weeks.  Any sutures or staples will be removed at the office during your follow-up visit. °8. ACTIVITIES:  You may resume regular (light) daily activities beginning the next day--such as daily self-care, walking, climbing stairs--gradually increasing activities as tolerated.  You may have sexual intercourse when it is comfortable.  Refrain from any heavy lifting or straining until approved by your doctor. ° °a.You may drive when you are no longer taking prescription pain medication, you can comfortably wear a seatbelt, and you can safely maneuver your car and apply brakes. °b.RETURN TO WORK:   °_____________________________________________ ° °9.You should see your doctor in the office for a follow-up appointment approximately 2-3 weeks after your surgery.  Make sure that you call for this appointment within a day or two after you arrive home to insure a convenient appointment time. °10.OTHER INSTRUCTIONS: _________________________ °   _____________________________________ ° °WHEN TO CALL YOUR DOCTOR: °1. Fever over 101.0 °2. Inability to urinate °3. Nausea and/or vomiting °4. Extreme swelling or bruising °5. Continued bleeding from incision. °6. Increased pain, redness, or drainage from the incision ° °The clinic staff is available to answer your questions during regular business hours.  Please don’t hesitate to call and ask to speak to one of the nurses for clinical concerns.  If you have a medical emergency, go to the nearest emergency room or call 911.  A surgeon from [facility] is always on call at the hospital ° ° °  [address], [address], [place], [place]  [address] ? ° [address] [address], [place], [place]   [address] °[phone] ? [phone] ? FAX [phone] °Web site: [email or web address] °

## 2020-03-04 NOTE — TOC Transition Note (Signed)
Transition of Care [facility]) - CM/SW Discharge Note   Patient Details  Name: Sally Wang MRN: [id number] Date of Birth: 17-Jan-1961  Transition of Care [facility]) CM/SW Contact:  Amada Jupiter, LCSW Phone Number: 03/04/2020, 4:24 PM   Clinical Narrative:   Psychiatry service has cleared pt for d/c home and, per MD, have communicated this to spouse.  Orders for TOC to assist with arrangement of IOP vs PHP.  Contacted Rita at [facility] to refer, however, at time of hospital d/c there is no appointment yet made.  [place] Sink to speak with NP at [facility] to decide between those programs and will contact pt's spouse directly once appointments are made.  IVC rescinded.    Final next level of care: Other (comment) (home with outpatient [facility] programming follow up) Barriers to Discharge: Barriers Resolved   Patient Goals and CMS Choice Patient states their goals for this hospitalization and ongoing recovery are:: unable      Discharge Placement                       Discharge Plan and Services                                     Social Determinants of Health (SDOH) Interventions     Readmission Risk Interventions No flowsheet data found.

## 2020-03-04 NOTE — Discharge Summary (Signed)
Physician Discharge Summary  [name]:[id number] DOB: [birth year] DOA: 02/27/2020  PCP: Patient, No Pcp Per  Admit date: 02/27/2020 Discharge date: 03/04/2020  Admitted From: Home Disposition:  Home with outpatient Psychiatric Follow up  Recommendations for Outpatient Follow-up:  1. Follow up with PCP in 1-2 weeks 2. Follow up General Surgery within 1-2 weeks 3. Follow up with Psychiatry within 1 week 4. Please obtain CMP/CBC, Mag, Phos in one week 5. Please follow up on the following pending results:  Home Health: No Equipment/Devices: None    Discharge Condition: Guarded CODE STATUS: FULL CODE  Diet recommendation: Heart Healthy Diet  Brief/Interim Summary: HPI per Dr. Midge Minium on 02/27/2020 Sally P Simmsis a 59 y.o.femalewithhistory of hypertension and schizoaffective disorder admitted and discharged from behavioral health on 02/05/2020 and as per the husband since the discharge has not been taking any of her medications was brought to the ER the patient has been having persistent nausea vomiting for the last 1 week. Not sure when was her last bowel movement. Patient has not had any chest pain shortness of breath or any fever chills. At times the vomitus is blood-tinged. During last admission patient was Covid positive.  ED Course:In the ER patient appears confused and at times talking hyperreligious. Patient is not allowing completed physical exam. Labs are significant for sodium 116 potassium 3 creatinine 1.2 lipase 66 with hemoglobin of 15.4 Covid test was negative. Drug screen is negative EKG shows sinus tachycardia.  **Interim History Patient remained extremely manic schizoaffective disorder she has been noncompliant with her medications.  Psychiatry was consulted andrecommending considering Zyprexa Zydis 5 mg p.o. or IM twice daily and they recommend continuing one-to-one continuous observation monitoring and recommending psychiatric inpatient admission  when she is medically cleared.    General Surgery evaluated and feels that she has incarcerated  hernia containing bowel that cannot be reduced.  She is at risk for bowel strangulation and eventual perforation without surgery and they discussed with the family and they understand and wish to proceed with the surgery which was done.  Patient sodium is improving slowly however she remained hyperreligious and extremely manic today in four-point restraints.  On 03/02/2020 her diet was advanced to regular diet.  She was be resumed on IV fluid hydration with normal saline given that her sodium slightly dropped from 131 -> 126. She would not let us recheck labs and has pulled out her IV.   It appears that her medical issues have been improved and we wanted to check her sodium however given her behavioral disturbances and with her sodium being above 125 for last 3 checks she is medically stable to be discharged to psychiatric facility where she wants further care given her significant mania as well as behavioral issues.  6/21: Calmer today but still has flight of ideas, pressured speech and is very hyperreligious and manic.  She is not as agitated and actually spoke with me today as opposed yesterday when she would not answer any my questions.  She has been deemed medically stable to transfer to psychiatric facility however the [facility] team informing that she needs a reconsult for psychiatry and so I placed this and awaiting reevaluation.  **After psychiatric reevaluation they feel that the patient is at baseline and that she does not require inpatient hospitalization now for psychiatric issues.  They recommended giving the patient IV Haldol decanoate 100 mg and discharging home and having the patient follow-up with her outpatient psychiatrist.  After several discussions with  the nurse practitioner as well as the psychiatrist they feel that she does not meet inpatient criteria and the psychiatrist feels that she  is actually at her baseline.  We will rescind her IV and discharge her home at the recommendations of psychiatry as they have cleared her.  She continues to have tangential thinking and flight of ideas but they feel that this the patient's new baseline. She has been deemed medically stable to be discharged yesterday and she will need to follow-up with PCP and now psychiatry in the outpatient setting.  I called and updated the husband and he wanted to speak with psychiatry and psychiatry spoke with them and the plan is to still discharge her home now.  Discharge Diagnoses:  Principal Problem:   Hyponatremia Active Problems:   Schizoaffective disorder (HCC)   ARF (acute renal failure) (HCC)   Hypokalemia  Severe hyponatremia with hypokalemia and CKD stage III in the setting of significant dehydration, improving  -Suspect patient's hyponatremia could be from dehydration that patient has been having vomiting but her vomiting has now stopped.  -C/w gently hydrating thinking that patient could be probably dehydrated given the acute renal failure with nausea vomiting.  Nausea vomiting is now improved. -Sodium is improving and potassium is still on the lower side but will be replete; potassium is improved yesterday -Sodium was 131 up from 116 but dropped slightly and is now 126 on last check. Has removed IV and will not allow [place] to repeat her labs -Potassium was 4.0 the day before yesterday but patient would not allow [place] to recheck labs today -continue monitor repeat as necessary -We had resumed IV fluid hydration yesterday and IV fluid hydration is now stopped as patient is allowed her IV and will not last recheck labs -She was stable for inpatient psychiatry facility and wanted to repeat her CBC and CMP within 1 week however psychiatry cleared her for discharge and she will go home now.  She will need to follow-up with PCP and psychiatry in 1 week.  CKD stage IIIa, improved Elevated Anion Gap,  improved -Patient's BUNs/creatinine has been relatively stable despite getting IV fluid hydration which leads me to believe that she does have some evidence of renal dysfunction -Anion Gap is now 9 down from 16 -We will stop her hydrochlorothiazide at discharge -Patient BUN/creatinine is now 16/0.97 yesterday and is trended down from 48/1.20 on admission -Avoid further nephrotoxic medications, contrast dyes, hypotension and renally dose medications -She was resumed on IV fluid hydration yesterday because her sodium slightly dropped and is now be stopped given that she is tolerating her food without issues and because she will not let us recheck labs and has pulled out her IV. -Repeat CMP within 1 week  Hyponatremia/Hypochloremia -Slowly improving and sodium is gone from 116 on admission i and trended up to 131 but dropped to 126 yesterday but has not allowed [place] to recheck her labs today -Patient is chloride level has gone from 67 is now 91 the day before yesterday. -Resumed IV fluid hydration with normal saline at 75 mils per hour plus IV sodium phosphate 10 mmol and she was given dose yesterday but this is now stopped given that she has pulled out her IV and because she refuses to check labs -Continue to monitor and trend -Hold hydrochlorothiazide -Repeat CMP within 1 week  Elevated ALT -Patient's ALT has been mildly elevated and peaked at 50 and on last check was 39 -On presentation it was 45 -Continue monitor  and trend and if continues to persistently be elevated will obtain a right upper quadrant ultrasound as well as a acute hepatitis panel  -Repeat CMP in 1 week  Leukocytosis -Patient's WBC went from 10.9 -> 6.5 -> 9.7 -> 6.4 and is now improved and she would not let us check labs today -Continue to monitor for signs and symptoms of infection; likely in the setting of her incarcerated hernia -Repeat CBC within 1 week  Hypokalemia -See above  -Patient's potassium was 4.0 the  day before yesterday -Magnesium level was 2.1 the day before yesterday -Continue to monitor and replete as necessary -Repeat CMP within 1 week  Nausea and Vomiting, improved  -Cause not clear but likley be incarcerated hernia as the CT of the abdomen pelvis showed a proximal partial small bowel obstruction to the level of the anterior umbilical hernia findings that could be suggestive of focal bowel incarceration with the remainder of the distal ileal loops being decompressed -General surgery was consulted and recommending n.p.o. and fluid hydration prior to her surgical intervention -Because she continues to have an incarcerated hernia containing bowel which can her be reduced she is at risk for bowel strangulation and omental perforation without surgery -She has been IVC but did not have the medical decision mental capacity to make decisions about surgery but family is active on her behalf and consented for surgical procedure -She is postoperative day 1 and now on sips with chips of ice  Hypertension  -Does not take any medication as per the patient has been medically patient. IV hydralazine if needed but removed her IV -will change to po Hydralazine 25 mg po q8hprn for SBP>150 or DBP>100 -BP was 156/101  Schizoaffective Disorder  -Has not been taking any of her medications since last discharge on 02/05/2020 and has been refusing them now.  Continues to refuse medication and had to be given IM Haldol decanoate by psychiatry -She is currently extremely manic and psychiatry been consulted.  She has been IVC'd has been placed in four-point restraints intermittently.  She continues remove her IVs -Psychiatry recommended inpatient psychiatric hospitalization initially once she was medically cleared and improved and after she had her surgery; surgery has now cleared the patient from their standpoint and recommending having the staples remain for 10 days and removed depending on the final  disposition.  She is also medically cleared from my perspective to go to a psychiatric facility but psychiatry evaluated and feels that she does not meet inpatient psychiatric hospitalization now and feels that she is baseline.  After several discussions with her psychiatrist and the psych nurse practitioner they still recommend discharging home and recommend Haldol decanoate.  They also recommend continuing Zyprexa and having the outpatient psychiatrist change her medications  Obesity -Estimated body mass index is 36.46 kg/m as calculated from the following:   Height as of this encounter: 5\' 7"  (1.702 m).   Weight as of this encounter: 105.6 kg. -Continued Weight Loss and Dietary Counseling   Discharge Instructions  Discharge Instructions    Call MD for:  difficulty breathing, headache or visual disturbances   Complete by: As directed    Call MD for:  extreme fatigue   Complete by: As directed    Call MD for:  hives   Complete by: As directed    Call MD for:  persistant dizziness or light-headedness   Complete by: As directed    Call MD for:  persistant nausea and vomiting   Complete by: As directed  Call MD for:  redness, tenderness, or signs of infection (pain, swelling, redness, odor or green/yellow discharge around incision site)   Complete by: As directed    Call MD for:  severe uncontrolled pain   Complete by: As directed    Call MD for:  temperature >100.4   Complete by: As directed    Diet - low sodium heart healthy   Complete by: As directed    Discharge instructions   Complete by: As directed    You were cared for by a hospitalist during your hospital stay. If you have any questions about your discharge medications or the care you received while you were in the hospital after you are discharged, you can call the unit and ask to speak with the hospitalist on call if the hospitalist that took care of you is not available. Once you are discharged, your primary care  physician will handle any further medical issues. Please note that NO REFILLS for any discharge medications will be authorized once you are discharged, as it is imperative that you return to your primary care physician (or establish a relationship with a primary care physician if you do not have one) for your aftercare needs so that they can reassess your need for medications and monitor your lab values.  Follow up with PCP, General Surgery, and Psychiatry in the outpatient setting. Take all medications as prescribed. If symptoms change or worsen please return to the ED for evaluation   Discharge wound care:   Complete by: As directed    Per Surgery. Needs Staples removed in 10 days   Increase activity slowly   Complete by: As directed      Allergies as of 03/04/2020   No Known Allergies     Medication List    STOP taking these medications   carbamazepine 100 MG chewable tablet Commonly known as: TEGRETOL   hydrochlorothiazide 25 MG tablet Commonly known as: HYDRODIURIL   risperiDONE 2 MG disintegrating tablet Commonly known as: RISPERDAL M-TABS   risperidone 3 MG disintegrating tablet Commonly known as: RISPERDAL M-TABS     TAKE these medications   amLODipine 5 MG tablet Commonly known as: NORVASC Take 2 tablets (10 mg total) by mouth daily. What changed: medication strength   OLANZapine zydis 5 MG disintegrating tablet Commonly known as: ZYPREXA Take 1 tablet (5 mg total) by mouth 2 (two) times daily.   ondansetron 4 MG tablet Commonly known as: ZOFRAN Take 1 tablet (4 mg total) by mouth every 6 (six) hours as needed for nausea.            Discharge Care Instructions  (From admission, onward)         Start     Ordered   03/04/20 0000  Discharge wound care:       Comments: Per Surgery. Needs Staples removed in 10 days   03/04/20 1458          Follow-up Information    Surgery, [facility]. Go on 03/11/2020.   Specialty: General Surgery Why: 10am.  Nurse visit for staple removal. Please arrive 30 minutes early for paperwork. Please bring a copy of your photo ID and insurance card.  Contact information: [address] [place] [address] [phone]        Abigail Miyamoto, MD. Go on 03/27/2020.   Specialty: General Surgery Why: 1040am. Please arrive 30 minutes early for paperwork. Please bring a copy of your photo ID and insurance card.  Contact  information: [address] [place] [address] [phone]              No Known Allergies  Consultations:  General Surgery  Psychiatry  Procedures/Studies: CT RENAL STONE STUDY  Result Date: 02/27/2020 CLINICAL DATA:  Abdominal pain nausea vomiting EXAM: CT ABDOMEN AND PELVIS WITHOUT CONTRAST TECHNIQUE: Multidetector CT imaging of the abdomen and pelvis was performed following the standard protocol without IV contrast. COMPARISON:  None. FINDINGS: Lower chest: The visualized heart size within normal limits. No pericardial fluid/thickening. No hiatal hernia. The visualized portions of the lungs are clear. Hepatobiliary: Although limited due to the lack of intravenous contrast, normal in appearance without gross focal abnormality. The patient is status post cholecystectomy. No biliary ductal dilation. Pancreas:  Unremarkable.  No surrounding inflammatory changes. Spleen: Normal in size. Although limited due to the lack of intravenous contrast, normal in appearance. Adrenals/Urinary Tract: Both adrenal glands appear normal. The kidneys and collecting system appear normal without evidence of urinary tract calculus or hydronephrosis. Bladder is unremarkable. Stomach/Bowel: The stomach is normal in appearance. There is moderately dilated jejunum and proximal ileum with air and fluid measuring up to 3.9 cm down to the level of the anterior umbilical hernia where there is a focal loop of ileum with surrounding fat stranding changes and possible wall thickening. The  distal ileal loop exiting the hernia appears to be decompressed with the remainder of the distal ileum and terminal ileum appear to be decompressed. There is scattered colonic diverticula without diverticulitis. Vascular/Lymphatic: There are no enlarged abdominal or pelvic lymph nodes. Scattered aortic atherosclerotic calcifications are seen without aneurysmal dilatation. Reproductive: The uterus and adnexa are unremarkable. Other: No evidence of abdominal wall mass or hernia. Musculoskeletal: No acute or significant osseous findings. IMPRESSION: Proximal partial small bowel obstruction to the level of the anterior umbilical hernia with findings that could be suggestive of focal bowel incarceration. The remainder of the distal ileal loops are decompressed. No renal or collecting system calculi. Aortic Atherosclerosis (ICD10-I70.0). Diverticulosis without diverticulitis. Electronically Signed   By: Jonna Clark M.D.   On: 02/27/2020 23:50     Subjective: Seen at bedside and she she was calmer today compared to yesterday but still has flight of ideas, tangential and pressured speech, and continues to be hyperreligious.  Tolerating her diet and not complaining of pain as she states that "God would not allow me pain".  She is medically stable to be transferred to a psychiatric facility however after reevaluation by psychiatry they do not feel that she meets inpatient psychiatric hospitalization.  The recommending discharging her home and given Haldol lactate.  Her IVC was rescinded and she will be discharged home at the recommendations of psychiatry.  SUrgery has signed off the case and recommending removing staples postoperative day 10.   Discharge Exam: Vitals:   03/03/20 2020 03/04/20 0602  BP: (!) 158/115 (!) 156/101  Pulse: (!) 114 93  Resp: 17   Temp: 98.2 F (36.8 C) 98.1 F (36.7 C)  SpO2: 99% 99%   Vitals:   03/02/20 0613 03/03/20 0652 03/03/20 2020 03/04/20 0602  BP: (!) 146/93 (!) 187/115  (!) 158/115 (!) 156/101  Pulse: 89 (!) 114 (!) 114 93  Resp: 18 (!) 22 17   Temp: 97.9 F (36.6 C) 98.4 F (36.9 C) 98.2 F (36.8 C) 98.1 F (36.7 C)  TempSrc: Oral Oral Oral Oral  SpO2: 100% 99% 99% 99%  Weight: 105.6 kg     Height:  Examination: Physical Exam:  Constitutional: WN/WD obese African-American female in NAD and appears calm and comfortable and is tolerating her diet Respiratory: Diminished to auscultation bilaterally, no wheezing, rales, rhonchi or crackles. Normal respiratory effort and patient is not tachypenic. No accessory muscle use.  Cardiovascular: RRR, no murmurs / rubs / gallops. S1 and S2 auscultated. Abdomen: Soft, non-tender, distended due to body habitus. Bowel sounds positive.  Midline incision in her umbilicus with staples that are clean dry and intact GU: Deferred. Neurologic: CN 2-12 grossly intact with no focal deficits. Romberg sign and cerebellar reflexes not assessed.  Psychiatric: Impaired judgment and insight.  Awake and alert but continues to be manic and has flight of ideas and pressured speech.    The results of significant diagnostics from this hospitalization (including imaging, microbiology, ancillary and laboratory) are listed below for reference.    Microbiology: Recent Results (from the past 240 hour(s))  SARS Coronavirus 2 by RT PCR (hospital order, performed in [facility] hospital lab) Nasopharyngeal Nasopharyngeal Swab     Status: None   Collection Time: 02/27/20  7:24 PM   Specimen: Nasopharyngeal Swab  Result Value Ref Range Status   SARS Coronavirus 2 NEGATIVE NEGATIVE Final    Comment: (NOTE) SARS-CoV-2 target nucleic acids are NOT DETECTED.  The SARS-CoV-2 RNA is generally detectable in upper and lower respiratory specimens during the acute phase of infection. The lowest concentration of SARS-CoV-2 viral copies this assay can detect is 250 copies / mL. A negative result does not preclude SARS-CoV-2 infection and  should not be used as the sole basis for treatment or other patient management decisions.  A negative result may occur with improper specimen collection / handling, submission of specimen other than nasopharyngeal swab, presence of viral mutation(s) within the areas targeted by this assay, and inadequate number of viral copies (<250 copies / mL). A negative result must be combined with clinical observations, patient history, and epidemiological information.  Fact Sheet for Patients:   BoilerBrush.com.cy  Fact Sheet for Healthcare Providers: https://pope.com/  This test is not yet approved or  cleared by the [place] FDA and has been authorized for detection and/or diagnosis of SARS-CoV-2 by FDA under an Emergency Use Authorization (EUA).  This EUA will remain in effect (meaning this test can be used) for the duration of the COVID-19 declaration under Section 564(b)(1) of the Act, 21 U.S.C. section 360bbb-3(b)(1), unless the authorization is terminated or revoked sooner.  Performed at [facility], [address], [place], [place] [address]    Labs: BNP (last 3 results) No results for input(s): BNP in the last 8760 hours. Basic Metabolic Panel: Recent Labs  Lab 02/28/20 1928 02/29/20 0121 02/29/20 0829 03/01/20 0416 03/02/20 0905  NA 121* 121* 127* 131* 126*  K 3.2* 3.1* 3.2* 3.4* 4.0  CL 75* 77* 82* 90* 91*  CO2 GLUCOSE 98 92 93 93 85  BUN 40* 34* 29* 19 16  CREATININE 1.12* 1.07* 1.17* 1.01* 0.97  CALCIUM 8.8* 8.5* 8.9 8.8* 8.1*  MG  --   --  2.5* 2.3 2.1  PHOS  --   --  3.6 3.6 2.4*   Liver Function Tests: Recent Labs  Lab 02/28/20 1454 02/28/20 1928 02/29/20 0829 03/01/20 0416 03/02/20 0905  AST 35 35 ALT 50* 49* 48* 47* 39  ALKPHOS 87 83 84 70 69  BILITOT 0.9 1.0 1.0 1.1 0.4  PROT 7.5 7.3 6.9 6.8 6.1*  ALBUMIN 3.7 3.6 3.3* 3.4* 3.1*   Recent Labs  Lab  02/27/20 1924  LIPASE 66*   No results for input(s): AMMONIA in the last 168 hours. CBC: Recent Labs  Lab 02/27/20 1924 02/28/20 0039 02/29/20 0829 03/01/20 0416 03/02/20 0905  WBC 9.7 10.9* 6.5 9.7 6.4  NEUTROABS 7.0 8.2* 4.3 8.2* 4.1  HGB 15.4* 15.0 14.8 13.7 12.9  HCT 43.9 42.7 43.5 41.9 39.4  MCV 80.3 80.4 82.4 84.3 86.4  PLT 385 339 308 247 194   Cardiac Enzymes: No results for input(s): CKTOTAL, CKMB, CKMBINDEX, TROPONINI in the last 168 hours. BNP: Invalid input(s): POCBNP CBG: No results for input(s): GLUCAP in the last 168 hours. D-Dimer No results for input(s): DDIMER in the last 72 hours. Hgb A1c No results for input(s): HGBA1C in the last 72 hours. Lipid Profile No results for input(s): CHOL, HDL, LDLCALC, TRIG, CHOLHDL, LDLDIRECT in the last 72 hours. Thyroid function studies No results for input(s): TSH, T4TOTAL, T3FREE, THYROIDAB in the last 72 hours.  Invalid input(s): FREET3 Anemia work up No results for input(s): VITAMINB12, FOLATE, FERRITIN, TIBC, IRON, RETICCTPCT in the last 72 hours. Urinalysis No results found for: COLORURINE, APPEARANCEUR, LABSPEC, PHURINE, GLUCOSEU, HGBUR, BILIRUBINUR, KETONESUR, PROTEINUR, UROBILINOGEN, NITRITE, LEUKOCYTESUR Sepsis Labs Invalid input(s): PROCALCITONIN,  WBC,  LACTICIDVEN Microbiology Recent Results (from the past 240 hour(s))  SARS Coronavirus 2 by RT PCR (hospital order, performed in [facility] hospital lab) Nasopharyngeal Nasopharyngeal Swab     Status: None   Collection Time: 02/27/20  7:24 PM   Specimen: Nasopharyngeal Swab  Result Value Ref Range Status   SARS Coronavirus 2 NEGATIVE NEGATIVE Final    Comment: (NOTE) SARS-CoV-2 target nucleic acids are NOT DETECTED.  The SARS-CoV-2 RNA is generally detectable in upper and lower respiratory specimens during the acute phase of infection. The lowest concentration of SARS-CoV-2 viral copies this assay can detect is 250 copies / mL. A negative result  does not preclude SARS-CoV-2 infection and should not be used as the sole basis for treatment or other patient management decisions.  A negative result may occur with improper specimen collection / handling, submission of specimen other than nasopharyngeal swab, presence of viral mutation(s) within the areas targeted by this assay, and inadequate number of viral copies (<250 copies / mL). A negative result must be combined with clinical observations, patient history, and epidemiological information.  Fact Sheet for Patients:   BoilerBrush.com.cyhttps://www.fda.gov/media/136312/download  Fact Sheet for Healthcare Providers: https://pope.com/https://www.fda.gov/media/136313/download  This test is not yet approved or  cleared by the [place] FDA and has been authorized for detection and/or diagnosis of SARS-CoV-2 by FDA under an Emergency Use Authorization (EUA).  This EUA will remain in effect (meaning this test can be used) for the duration of the COVID-19 declaration under Section 564(b)(1) of the Act, 21 U.S.C. section 360bbb-3(b)(1), unless the authorization is terminated or revoked sooner.  Performed at [facility], [address] [place], [place] [address]    Time coordinating discharge: 35 minutes  SIGNED:  Merlene Laughtermair Latif Iman Reinertsen, DO [facility] 03/04/2020, 3:00 PM Pager is on AMION  If 7PM-7AM, please contact night-coverage [email or web address]

## 2020-03-04 NOTE — Consult Note (Addendum)
[facility] Face-to-Face Psychiatry Consult   Reason for Consult:  "Agitation/Psychosis/Schizoaffective Disorder" Referring Physician:  Marikay Alar FNP Patient Identification: Sally Wang MRN:  [id number] Principal Diagnosis: Hyponatremia Diagnosis:  Principal Problem:   Hyponatremia Active Problems:   Schizoaffective disorder (HCC)   ARF (acute renal failure) (HCC)   Hypokalemia   Total Time spent with patient: 30 minutes  Subjective: "I'm in good health and sound mind"   HPI: Sally Wang, 59 y.o., female patient seen face to face by this provider, consulted with Dr. Lucianne Muss; and chart reviewed on 03/04/20.  On evaluation Sally Wang reports she is feeling fine.  States that she has refused to take medication because it is her right.  Patient is aware of her current location and is able to answer questions (rumination) but eventual gives the answer.  Patient relates all answerers to religion; "Yes I have support; God has given me the support I need; all of my children are supportive; You have all of the contact information you need on the computer; God has made things assessable for the knowledge needed; if God didn't want you to have contact he wouldn't have provided a way to get the information."  Patient appears to be at her baseline.  Patient has responded well to Haldol Decanoate in the past; will order today and psych clear.   During evaluation Sally Wang is alert/oriented x 4; calm/cooperative; and mood is congruent with affect. She does not appear to be responding to internal/external stimuli or delusional thoughts; but she is hyper religious, .  Patient denies suicidal/self-harm/homicidal ideation, psychosis, and paranoia.  Patient answered question appropriately.    02/28/20  Sally Wang is a 59 y.o. female patient. Patient states "I don't have any problem in my mind or in my body."  Patient observed refusing exam by medical doctor during assessment, patient states "would everybody  please leave the room, I do not need anything." Patient assessed by nurse practitioner.  Patient alert and oriented, participates in assessment. 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  Patient denies auditory visual hallucinations. Patient appears mildly paranoid, states "I am not sure why need to be examined, I can tell you there is nothing wrong with me, why would there be anything wrong with me?" Patient declines to participate further in interview, calmly asked this writer to leave the room.   Past Psychiatric History: Schizoaffective disorder  Risk to Self:   Denies Risk to Others:   Denies Prior Inpatient Therapy:   Call behavioral health hospital, May 2021 Prior Outpatient Therapy:   Currently seen by Rhunette Croft  Past Medical History:  Past Medical History:  Diagnosis Date  . Hypertension   . Schizoaffective disorder [facility])     Past Surgical History:  Procedure Laterality Date  . CHOLECYSTECTOMY  2006  . UMBILICAL HERNIA REPAIR N/A 02/29/2020   Procedure: HERNIA REPAIR UMBILICAL ADULT;  Surgeon: Abigail Miyamoto, MD;  Location: [facility] ORS;  Service: General;  Laterality: N/A;   Family History:  Family History  Problem Relation Age of Onset  . Coronary artery disease Father        died 52 yo  . Diabetes Mother        died 62  . Hypertension Brother    Family Psychiatric  History:  None reported Social History:  Social History   Substance and Sexual Activity  Alcohol Use No     Social History   Substance and Sexual Activity  Drug Use No    Social History   Socioeconomic History  . Marital status: Married    Spouse name: Hoy Morn  . Number of children: Not on file  . Years of education: Not on file  . Highest education level: Not on file  Occupational History   . Occupation: homemaker  Tobacco Use  . Smoking status: Never Smoker  . Smokeless tobacco: Never Used  Vaping Use  . Vaping Use: Never used  Substance and Sexual Activity  . Alcohol use: No  . Drug use: No  . Sexual activity: Not Currently  Other Topics Concern  . Not on file  Social History Narrative   Husband - [place] -2010 granddaughte      Pets -  Pit bulls #4            Social Determinants of Health   Financial Resource Strain:   . Difficulty of Paying Living Expenses:   Food Insecurity:   . Worried About Charity fundraiser in the Last Year:   . Arboriculturist in the Last Year:   Transportation Needs:   . Film/video editor (Medical):   Marland Kitchen Lack of Transportation (Non-Medical):   Physical Activity:   . Days of Exercise per Week:   . Minutes of Exercise per Session:   Stress:   . Feeling of Stress :   Social Connections:   . Frequency of Communication with Friends and Family:   . Frequency of Social Gatherings with Friends and Family:   . Attends Religious Services:   . Active Member of Clubs or Organizations:   . Attends Archivist Meetings:   Marland Kitchen Marital Status:    Additional Social History:    Allergies:  No Known Allergies  Labs:  No results found for this or any previous visit (from the past 48 hour(s)).  Current Facility-Administered Medications  Medication Dose Route Frequency Provider Last Rate Last Admin  . 0.9 %  sodium chloride infusion   Intravenous Continuous Raiford Noble [place], DO   Stopped at 03/03/20 0645  . haloperidol lactate (HALDOL) injection 2 mg  2 mg Intramuscular Q6H PRN Coralie Keens, MD   2 mg at 03/03/20 0645  . hydrALAZINE (APRESOLINE) injection 10 mg  10 mg Intravenous Q4H PRN Coralie Keens, MD   10 mg at 03/01/20 1825  . hydrALAZINE (APRESOLINE) tablet 25 mg  25 mg Oral Q8H PRN Raiford Noble Latif, DO      . morphine 2 MG/ML injection 1-4 mg  1-4 mg Intravenous  Q1H PRN Coralie Keens, MD      . OLANZapine zydis (ZYPREXA) disintegrating tablet 5 mg  5 mg Oral BID Coralie Keens, MD   5 mg at 03/02/20 0925  . ondansetron (ZOFRAN) tablet 4 mg  4 mg Oral Q6H PRN Coralie Keens, MD       Or  . ondansetron [facility]) injection 4 mg  4 mg Intravenous Q6H PRN Coralie Keens, MD      . piperacillin-tazobactam (ZOSYN) IVPB 3.375 g  3.375 g Intravenous Q8H Coralie Keens, MD 12.5 mL/hr at 03/03/20 0536 3.375 g at 03/03/20 0536    Musculoskeletal: Strength & Muscle Tone: within normal limits Gait & Station: Unable to assess Patient leans: N/A  Psychiatric Specialty  Exam: Physical Exam  Nursing note and vitals reviewed. Constitutional: She is oriented to person, place, and time. She appears well-developed. She is uncooperative.  HENT:  Head: Normocephalic.  Cardiovascular: Normal rate.  Respiratory: Effort normal.  Neurological: She is alert and oriented to person, place, and time.  Psychiatric: Her speech is tangential. Thought content is delusional. Thought content is not paranoid. She expresses inappropriate judgment.    Review of Systems  Constitutional: Negative.   HENT: Negative.   Eyes: Negative.   Respiratory: Negative.   Cardiovascular: Negative.   Gastrointestinal: Negative.   Genitourinary: Negative.   Musculoskeletal: Negative.   Skin: Negative.   Neurological: Negative.   Psychiatric/Behavioral: Positive for agitation. Hallucinations: Denies. Self-injury: Denies. Suicidal ideas: Denies. Nervous/anxious: Stable.     Blood pressure (!) 156/101, pulse 93, temperature 98.1 F (36.7 C), temperature source Oral, resp. rate 17, height 5\' 7"  (1.702 m), weight 105.6 kg, SpO2 99 %.Body mass index is 36.46 kg/m.  General Appearance: Casual and Fairly Groomed  Eye Contact:  Good  Speech:  Clear and Coherent and Normal Rate  Volume:  Normal  Mood:  Anxious  Affect:  Congruent  Thought Process:  Coherent, Goal Directed and  Descriptions of Associations: Tangential  Orientation:  Full (Time, Place, and Person)  Thought Content:  Paranoid Ideation and Tangential  Suicidal Thoughts:  No  Homicidal Thoughts:  No  Memory:  Immediate;   Fair Recent;   Fair Remote;   Fair  Judgement:  Impaired  Insight:  Lacking  Psychomotor Activity:  Normal  Concentration:  Concentration: Fair and Attention Span: Fair  Recall:  of Knowledge:  Good  Language:  Good  Akathisia:  No  Handed:  Right  AIMS (if indicated):     Assets:  Communication Skills Desire for Improvement Financial Resources/Insurance Housing Intimacy Leisure Time Physical Health Resilience Social Support  ADL's:  Unable to assess   Cognition:  WNL  Sleep:        Treatment Plan Summary: Patient discussed Dr. [employer].  Patient is a 59 year old female, recently discharged from [facility] behavioral health.  Patient with history of schizoaffective disorder, patient presents with Per patient medical record patient has been noncompliant with medications times approximately 1 month.   Currently patient appears to be at base line.  Will order Haldol Decanoate 100 mg IM.  Patient psychiatrically cleared to follow up with outpatient psychiatric provider.    Medication management: -Recommend consider Zyprexa zydis 5 mg p.o. or IM twice daily. -Recommend consider continue one-to-one continuous observation monitor for safety.  Haldol Decoanate 100 mg IM.  Disposition:  Psychiatrically clear to follow up with outpatient psychiatric provider.   No evidence of imminent risk to self or others at present.   Patient does not meet criteria for psychiatric inpatient admission. Supportive therapy provided about ongoing stressors. Discussed crisis plan, support from social network, calling 911, coming to the Emergency Department, and calling Suicide Hotline.   Sent message to [facility]:  Patient at base line.  Ordered Haldol Decanoate 100 mg to be given IM.   Patient psychiatrically cleared to follow up with current outpatient psychiatric provider.   Spoke with patients husband who informs it is difficult to get the patient to the doctor and difficult to get her to take medication.  Informed that Haldol Dec was restarted and that I would order social work to set her up with outpatient psychiatric services that can be done virtually and he felt that, that would be  better.     Viviano Bir, NP 03/04/2020 2:02 PM

## 2020-03-04 NOTE — Progress Notes (Signed)
Dc'd to home escorted off unit by husband via w/c at 2042

## 2020-03-05 ENCOUNTER — Telehealth (HOSPITAL_COMMUNITY): Payer: Self-pay | Admitting: Psychiatry

## 2020-03-05 NOTE — Telephone Encounter (Signed)
D:  Patient was referred to PHP/MH-IOP per Dell Ponto, NP.  A:  Discussed pt in PHP's treatment team.  Per Wyvonna Plum, NP, she agrees with the team that patient isn't group appropriate at this time.  She would like someone to contact patient next week to see if she's appropriate then. Case manager called and inform pt's husband of disposition and someone would be calling next week.  R:  Husband was receptive.

## 2020-03-10 NOTE — Anesthesia Postprocedure Evaluation (Signed)
Anesthesia Post Note  Patient: Destini P Kyler  Procedure(s) Performed: HERNIA REPAIR UMBILICAL ADULT (N/A )     Patient location during evaluation: PACU Anesthesia Type: General Level of consciousness: awake and alert Pain management: pain level controlled Vital Signs Assessment: post-procedure vital signs reviewed and stable Respiratory status: spontaneous breathing, nonlabored ventilation, respiratory function stable and patient connected to nasal cannula oxygen Cardiovascular status: blood pressure returned to baseline and stable Postop Assessment: no apparent nausea or vomiting Anesthetic complications: no   No complications documented.  Last Vitals:  Vitals:   03/04/20 0602 03/04/20 1500  BP: (!) 156/101 (!) 138/95  Pulse: 93 100  Resp:    Temp: 36.7 C 36.8 C  SpO2: 99% 98%    Last Pain:  Vitals:   03/04/20 1500  TempSrc: Oral  PainSc:                  Jayd Cadieux L Lagretta Loseke

## 2020-04-05 DIAGNOSIS — F250 Schizoaffective disorder, bipolar type: Secondary | ICD-10-CM | POA: Diagnosis not present

## 2020-05-29 DIAGNOSIS — F250 Schizoaffective disorder, bipolar type: Secondary | ICD-10-CM | POA: Diagnosis not present

## 2020-07-25 DIAGNOSIS — F250 Schizoaffective disorder, bipolar type: Secondary | ICD-10-CM | POA: Diagnosis not present

## 2020-11-10 IMAGING — CT CT RENAL STONE PROTOCOL
2 of 4 series · 16 of 46 positions shown, 18 images · non-contrast
Comparison: None.

CLINICAL DATA: Abdominal pain nausea vomiting

EXAM:
CT ABDOMEN AND PELVIS WITHOUT CONTRAST
TECHNIQUE: Multidetector CT imaging of the abdomen and pelvis was performed
following the standard protocol without IV contrast.

[Series 2: axial st · axial · 0.82mm/px · z∈[-770,-365]mm · 13 of 93 slices shown, 15 images]
[im 6/93  soft-tissue]
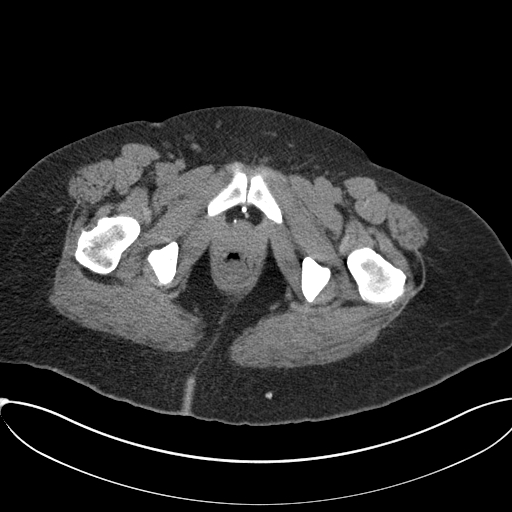
[im 6/93  bone]
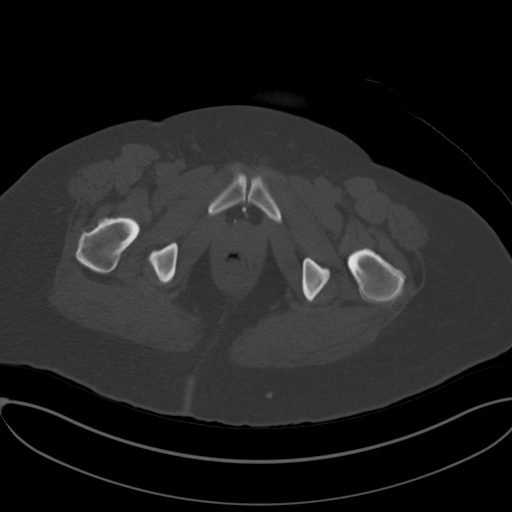
[im 12/93  soft-tissue]
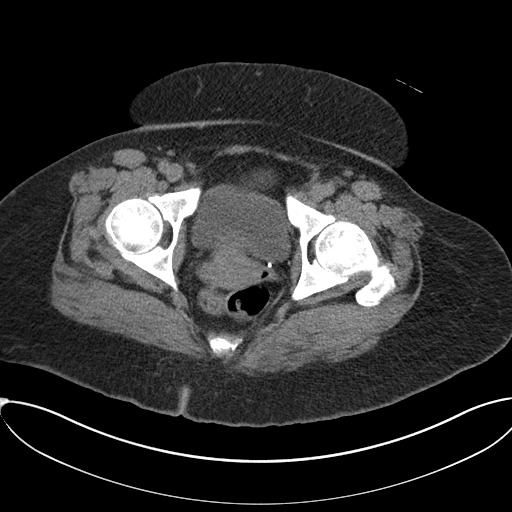
[im 18/93  soft-tissue]
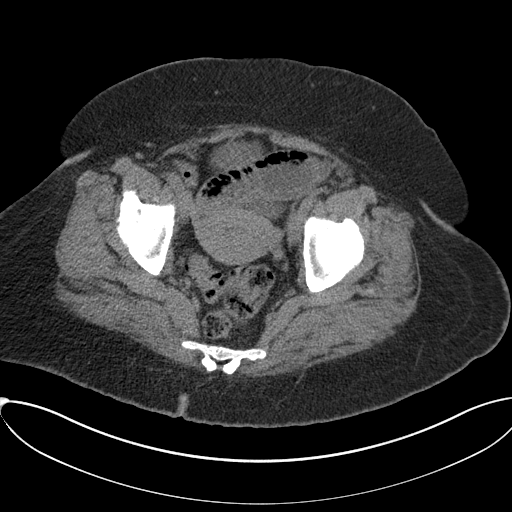
[im 29/93  soft-tissue]
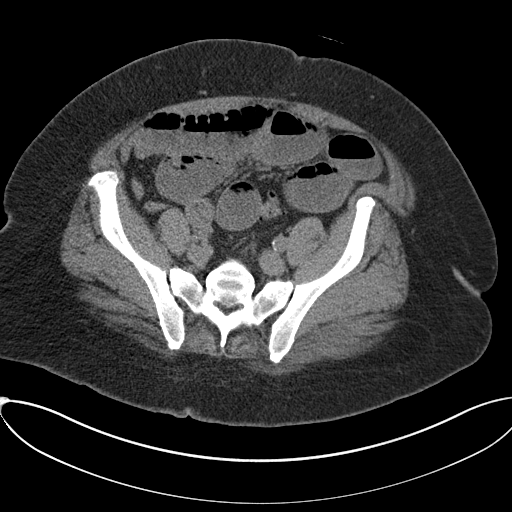
[im 35/93  soft-tissue]
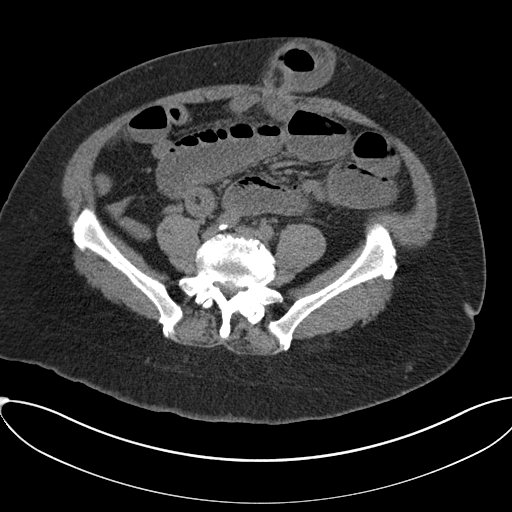
[im 41/93  soft-tissue]
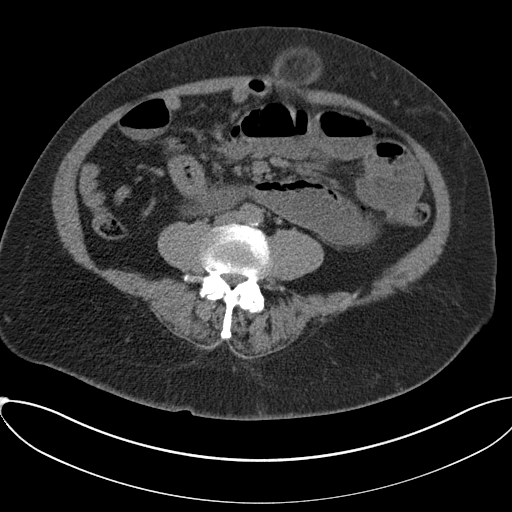
[im 47/93  soft-tissue]
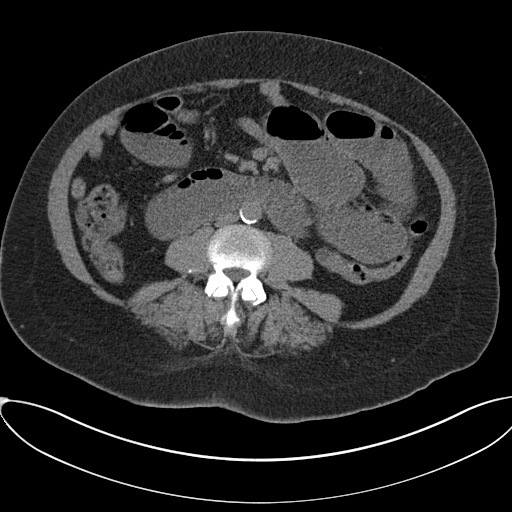
[im 52/93  soft-tissue]
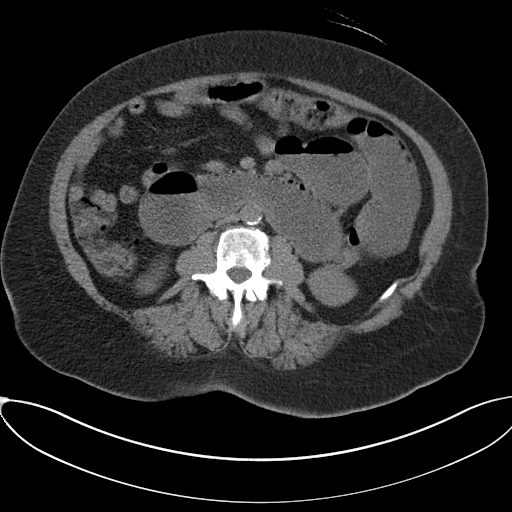
[im 58/93  soft-tissue]
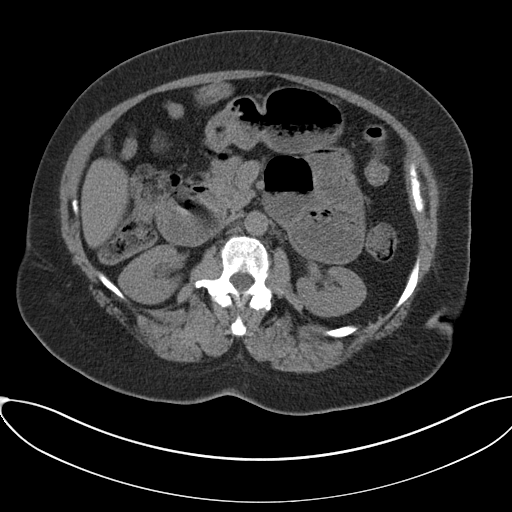
[im 58/93  bone]
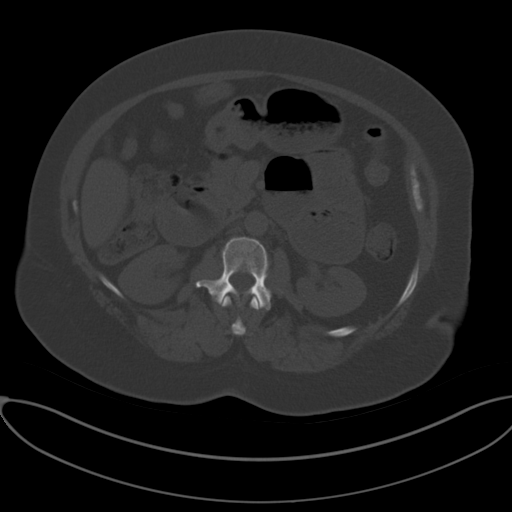
[im 64/93  soft-tissue]
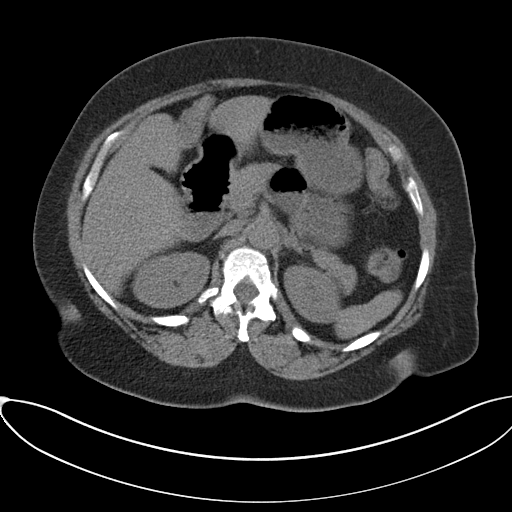
[im 75/93  soft-tissue]
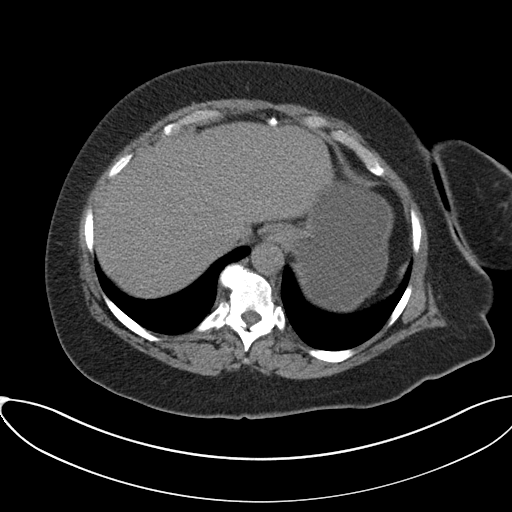
[im 81/93  soft-tissue]
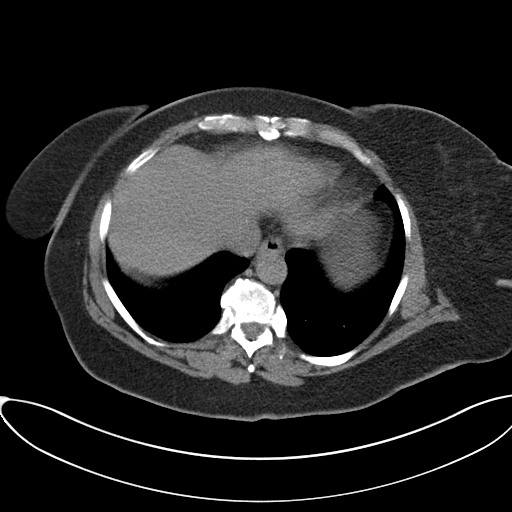
[im 87/93  soft-tissue]
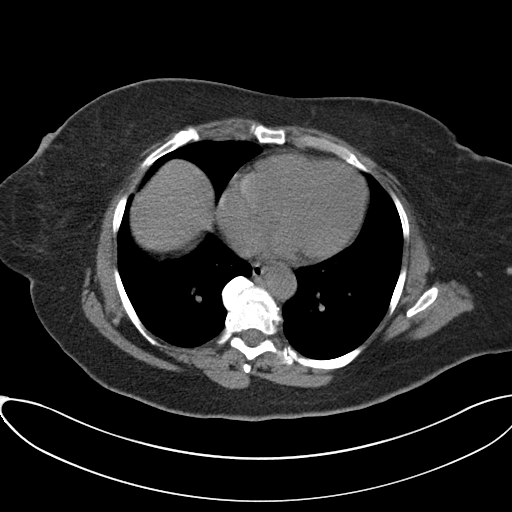

[Series 4: coronal · coronal · 0.86mm/px · 3 of 167 slices shown]
[im 56/167  soft-tissue]
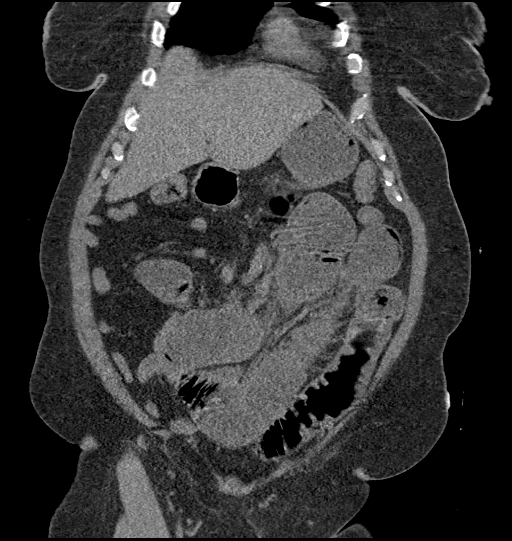
[im 74/167  soft-tissue]
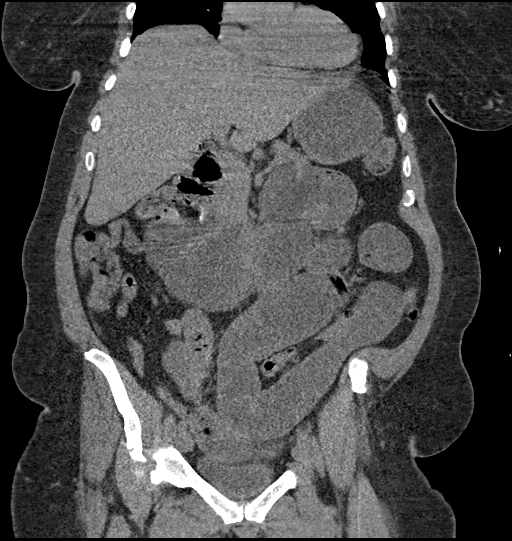
[im 93/167  soft-tissue]
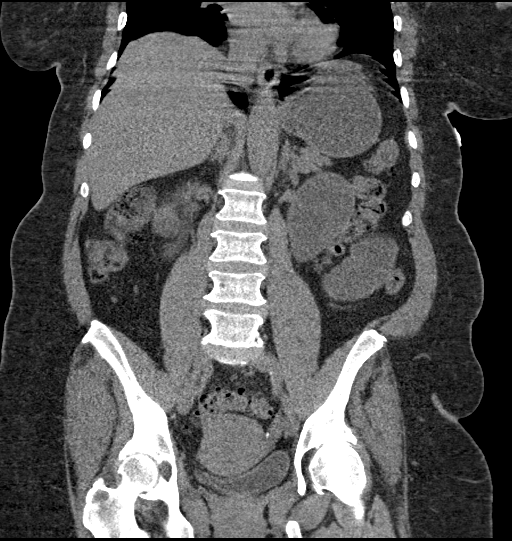

[16 of 46 positions shown; findings below may reference images not displayed]

FINDINGS: Lower chest: The visualized heart size within normal limits. No
pericardial fluid/thickening.

No hiatal hernia.

The visualized portions of the lungs are clear.

Hepatobiliary: Although limited due to the lack of intravenous
contrast, normal in appearance without gross focal abnormality. The
patient is status post cholecystectomy. No biliary ductal dilation.

Pancreas:  Unremarkable.  No surrounding inflammatory changes.

Spleen: Normal in size. Although limited due to the lack of
intravenous contrast, normal in appearance.

Adrenals/Urinary Tract: Both adrenal glands appear normal. The
kidneys and collecting system appear normal without evidence of
urinary tract calculus or hydronephrosis. Bladder is unremarkable.

Stomach/Bowel: The stomach is normal in appearance. There is
moderately dilated jejunum and proximal ileum with air and fluid
measuring up to 3.9 cm down to the level of the anterior umbilical
hernia where there is a focal loop of ileum with surrounding fat
stranding changes and possible wall thickening. The distal ileal
loop exiting the hernia appears to be decompressed with the
remainder of the distal ileum and terminal ileum appear to be
decompressed. There is scattered colonic diverticula without
diverticulitis.

Vascular/Lymphatic: There are no enlarged abdominal or pelvic lymph
nodes. Scattered aortic atherosclerotic calcifications are seen
without aneurysmal dilatation.

Reproductive: The uterus and adnexa are unremarkable.

Other: No evidence of abdominal wall mass or hernia.

Musculoskeletal: No acute or significant osseous findings.
IMPRESSION: Proximal partial small bowel obstruction to the level of the
anterior umbilical hernia with findings that could be suggestive of
focal bowel incarceration. The remainder of the distal ileal loops
are decompressed.

No renal or collecting system calculi.

Aortic Atherosclerosis (TWRC8-V34.4).

Diverticulosis without diverticulitis.

## 2020-11-26 DIAGNOSIS — F250 Schizoaffective disorder, bipolar type: Secondary | ICD-10-CM | POA: Diagnosis not present

## 2020-12-06 DIAGNOSIS — F250 Schizoaffective disorder, bipolar type: Secondary | ICD-10-CM | POA: Diagnosis not present

## 2021-01-16 DIAGNOSIS — F250 Schizoaffective disorder, bipolar type: Secondary | ICD-10-CM | POA: Diagnosis not present

## 2021-01-23 DIAGNOSIS — F250 Schizoaffective disorder, bipolar type: Secondary | ICD-10-CM | POA: Diagnosis not present

## 2021-02-17 DIAGNOSIS — F250 Schizoaffective disorder, bipolar type: Secondary | ICD-10-CM | POA: Diagnosis not present

## 2021-02-20 DIAGNOSIS — F250 Schizoaffective disorder, bipolar type: Secondary | ICD-10-CM | POA: Diagnosis not present

## 2021-03-25 DIAGNOSIS — F250 Schizoaffective disorder, bipolar type: Secondary | ICD-10-CM | POA: Diagnosis not present

## 2021-04-22 DIAGNOSIS — F250 Schizoaffective disorder, bipolar type: Secondary | ICD-10-CM | POA: Diagnosis not present

## 2021-05-02 DIAGNOSIS — F250 Schizoaffective disorder, bipolar type: Secondary | ICD-10-CM | POA: Diagnosis not present

## 2021-06-05 DIAGNOSIS — F250 Schizoaffective disorder, bipolar type: Secondary | ICD-10-CM | POA: Diagnosis not present

## 2021-07-14 DIAGNOSIS — F250 Schizoaffective disorder, bipolar type: Secondary | ICD-10-CM | POA: Diagnosis not present

## 2021-10-22 DIAGNOSIS — F250 Schizoaffective disorder, bipolar type: Secondary | ICD-10-CM | POA: Diagnosis not present

## 2022-01-29 ENCOUNTER — Encounter (HOSPITAL_COMMUNITY): Payer: Self-pay | Admitting: Emergency Medicine

## 2022-01-29 ENCOUNTER — Emergency Department (HOSPITAL_COMMUNITY)
Admission: EM | Admit: 2022-01-29 | Discharge: 2022-02-02 | Disposition: A | Discharge status: Other Acute Inpatient Hospital | Payer: Medicare HMO | Attending: Emergency Medicine | Admitting: Emergency Medicine

## 2022-01-29 ENCOUNTER — Other Ambulatory Visit: Payer: Self-pay

## 2022-01-29 ENCOUNTER — Ambulatory Visit (INDEPENDENT_AMBULATORY_CARE_PROVIDER_SITE_OTHER)
Admission: EM | Admit: 2022-01-29 | Discharge: 2022-01-29 | Disposition: A | Discharge status: Other Acute Inpatient Hospital | Payer: Medicare HMO | Source: Home / Self Care

## 2022-01-29 DIAGNOSIS — F911 Conduct disorder, childhood-onset type: Secondary | ICD-10-CM | POA: Diagnosis not present

## 2022-01-29 DIAGNOSIS — Z91128 Patient's intentional underdosing of medication regimen for other reason: Secondary | ICD-10-CM | POA: Diagnosis not present

## 2022-01-29 DIAGNOSIS — F209 Schizophrenia, unspecified: Secondary | ICD-10-CM | POA: Insufficient documentation

## 2022-01-29 DIAGNOSIS — R609 Edema, unspecified: Secondary | ICD-10-CM | POA: Diagnosis not present

## 2022-01-29 DIAGNOSIS — Z20822 Contact with and (suspected) exposure to covid-19: Secondary | ICD-10-CM | POA: Diagnosis not present

## 2022-01-29 DIAGNOSIS — Z5320 Procedure and treatment not carried out because of patient's decision for unspecified reasons: Secondary | ICD-10-CM | POA: Diagnosis not present

## 2022-01-29 DIAGNOSIS — Z79899 Other long term (current) drug therapy: Secondary | ICD-10-CM | POA: Insufficient documentation

## 2022-01-29 DIAGNOSIS — R451 Restlessness and agitation: Secondary | ICD-10-CM | POA: Diagnosis not present

## 2022-01-29 DIAGNOSIS — R443 Hallucinations, unspecified: Secondary | ICD-10-CM | POA: Insufficient documentation

## 2022-01-29 DIAGNOSIS — Z046 Encounter for general psychiatric examination, requested by authority: Secondary | ICD-10-CM | POA: Insufficient documentation

## 2022-01-29 DIAGNOSIS — F919 Conduct disorder, unspecified: Secondary | ICD-10-CM | POA: Diagnosis not present

## 2022-01-29 DIAGNOSIS — R4689 Other symptoms and signs involving appearance and behavior: Secondary | ICD-10-CM

## 2022-01-29 DIAGNOSIS — F29 Unspecified psychosis not due to a substance or known physiological condition: Secondary | ICD-10-CM | POA: Diagnosis not present

## 2022-01-29 DIAGNOSIS — Z743 Need for continuous supervision: Secondary | ICD-10-CM | POA: Diagnosis not present

## 2022-01-29 DIAGNOSIS — I10 Essential (primary) hypertension: Secondary | ICD-10-CM | POA: Diagnosis not present

## 2022-01-29 DIAGNOSIS — T50916A Underdosing of multiple unspecified drugs, medicaments and biological substances, initial encounter: Secondary | ICD-10-CM | POA: Diagnosis not present

## 2022-01-29 DIAGNOSIS — F259 Schizoaffective disorder, unspecified: Secondary | ICD-10-CM | POA: Diagnosis present

## 2022-01-29 LAB — CBC WITH DIFFERENTIAL/PLATELET
Abs Immature Granulocytes: 0.01 10*3/uL (ref 0.00–0.07)
Basophils Absolute: 0 10*3/uL (ref 0.0–0.1)
Basophils Relative: 0 %
Eosinophils Absolute: 0.1 10*3/uL (ref 0.0–0.5)
Eosinophils Relative: 3 %
HCT: 41.8 % (ref 36.0–46.0)
Hemoglobin: 13.4 g/dL (ref 12.0–15.0)
Immature Granulocytes: 0 %
Lymphocytes Relative: 33 %
Lymphs Abs: 1.6 10*3/uL (ref 0.7–4.0)
MCH: 29.1 pg (ref 26.0–34.0)
MCHC: 32.1 g/dL (ref 30.0–36.0)
MCV: 90.9 fL (ref 80.0–100.0)
Monocytes Absolute: 0.3 10*3/uL (ref 0.1–1.0)
Monocytes Relative: 6 %
Neutro Abs: 2.8 10*3/uL (ref 1.7–7.7)
Neutrophils Relative %: 58 %
Platelets: 280 10*3/uL (ref 150–400)
RBC: 4.6 MIL/uL (ref 3.87–5.11)
RDW: 13.1 % (ref 11.5–15.5)
WBC: 4.8 10*3/uL (ref 4.0–10.5)
nRBC: 0 % (ref 0.0–0.2)

## 2022-01-29 LAB — COMPREHENSIVE METABOLIC PANEL
ALT: 18 U/L (ref 0–44)
AST: 19 U/L (ref 15–41)
Albumin: 3.7 g/dL (ref 3.5–5.0)
Alkaline Phosphatase: 81 U/L (ref 38–126)
Anion gap: 8 (ref 5–15)
BUN: 11 mg/dL (ref 6–20)
CO2: 25 mmol/L (ref 22–32)
Calcium: 9.1 mg/dL (ref 8.9–10.3)
Chloride: 105 mmol/L (ref 98–111)
Creatinine, Ser: 0.86 mg/dL (ref 0.44–1.00)
GFR, Estimated: >60 mL/min (ref >60)
Glucose, Bld: 141 mg/dL — ABNORMAL HIGH (ref 70–99)
Potassium: 3.4 mmol/L — ABNORMAL LOW (ref 3.5–5.1)
Sodium: 138 mmol/L (ref 135–145)
Total Bilirubin: 0.2 mg/dL — ABNORMAL LOW (ref 0.3–1.2)
Total Protein: 7.7 g/dL (ref 6.5–8.1)

## 2022-01-29 LAB — ETHANOL: Alcohol, Ethyl (B): <10 mg/dL (ref <10)

## 2022-01-29 LAB — ACETAMINOPHEN LEVEL: Acetaminophen (Tylenol), Serum: <10 ug/mL — ABNORMAL LOW (ref 10–30)

## 2022-01-29 LAB — SALICYLATE LEVEL: Salicylate Lvl: <7 mg/dL — ABNORMAL LOW (ref 7.0–30.0)

## 2022-01-29 NOTE — ED Triage Notes (Signed)
Patient arrived with EMS from home , IVC for psychiatric treatment - psychosis /aggressive towards family . Denies SI or HI , no hallucinations .

## 2022-01-29 NOTE — ED Provider Triage Note (Signed)
Emergency Medicine Provider Triage Evaluation Note  Sally Wang , a 61 y.o. female  was evaluated in triage.  Pt presents to ED under IVC.  Patient IVC by family members due to concern for aggressive behavior.  She has a history of schizophrenia. Sent from [facility].  Denies any complaints.  States that if she did have complaints "I have someone to take care of it."  Review of Systems  Positive: Aggressive behavior Negative: Suicidal ideation  Physical Exam  BP (!) 169/108   Pulse (!) 114   Temp 98 F (36.7 C)   Resp 18   SpO2 99%  Gen:   Awake, no distress   Resp:  Normal effort  MSK:   Moves extremities without difficulty  Other:  Patient calm  Medical Decision Making  Medically screening exam initiated at 9:44 PM.  Appropriate orders placed.  Sally Wang was informed that the remainder of the evaluation will be completed by another provider, this initial triage assessment does not replace that evaluation, and the importance of remaining in the ED until their evaluation is complete.  We will order medical screening labs   Dietrich Pates, [place] 01/29/22 2146

## 2022-01-29 NOTE — ED Triage Notes (Signed)
Pt presents to [facility] under IVC, petitioned by her husband. Pt refused vitals and did not believe she needed to be here. Very little information was given during triage process.  Per IVC- Pt has been previously diagnosed with schizophrenia, she has medication for her condition but is non-compliant with her medication regimen. She is having medical issues in addition to her mental health issues. She has symptoms of medical issues, but is refusing medical treatment. She is acting erratically. RE: she made bacon with weeds that she pulled from the yard, respondent is combative and argumentative with family members, family is concerned for her well being as she continues to decline mentally and physically.

## 2022-01-29 NOTE — ED Provider Notes (Signed)
Behavioral Health Urgent Care Medical Screening Exam  Patient Name: Sally Wang MRN: [id number] Date of Evaluation: 01/30/22 Chief Complaint:   Diagnosis:  Final diagnoses:  Behavior concern  Mental health assessment declined  Mild unsocial aggression    History of Present illness: Sally Wang is a 61 y.o. female.  Patient presents to [facility] by  [facility], under IVC. Per IVC, respondent has been previously diagnosed with schizophrenia, she has medications for her condition but is noncompliant with her medication regiment.  She is having a medical issue in addition to her mental health issues.  She has symptoms of medical issues but is refusing medical treatment.  She is acting erratically, RE: she made bacon with weeds that she pulled from the yard.  Respondent is combative and argumentative with family members, family's concern for her wellbeing as she is continued to decline mentally and physically.  Observation of patient.  Patient is alert and oriented x4 mood argumentative and angry, affect flat.  Patient patient needs assistance ambulating, therefore we will transfer patient to [facility]: ED spoke with Dr.Steinl MD, who agreed to take the patient.  Psychiatric Specialty Exam  Presentation  General Appearance:Casual  Eye Contact:Fair  Speech:Clear and Coherent  Speech Volume:Normal  Handedness:Ambidextrous   Mood and Affect  Mood:Anxious  Affect:Constricted   Thought Process  Thought Processes:Linear  Descriptions of Associations:Tangential  Orientation:Full (Time, Place and Person)  Thought Content:Illogical    Hallucinations:None  Ideas of Reference:None  Suicidal Thoughts:No  Homicidal Thoughts:No   Sensorium  Memory:Immediate Fair  Judgment:Fair  Insight:Fair   Executive Functions  Concentration:Fair  Attention Span:Fair  Recall:Fair  Fund of Knowledge:Fair  Language:Fair   Psychomotor Activity  Psychomotor  Activity:Normal   Assets  Assets:Desire for Improvement   Sleep  Sleep:Fair  Number of hours: No data recorded  Nutritional Assessment (For OBS and FBC admissions only) Has the patient had a weight loss or gain of 10 pounds or more in the last 3 months?: No Has the patient had a decrease in food intake/or appetite?: No Does the patient have dental problems?: No Does the patient have eating habits or behaviors that may be indicators of an eating disorder including binging or inducing vomiting?: No Has the patient recently lost weight without trying?: 0 Has the patient been eating poorly because of a decreased appetite?: 0 Malnutrition Screening Tool Score: 0    Physical Exam: Physical Exam HENT:     Head: Normocephalic.     Nose: Nose normal.  Cardiovascular:     Rate and Rhythm: Normal rate.  Pulmonary:     Effort: Pulmonary effort is normal.  Musculoskeletal:        General: Normal range of motion.     Cervical back: Normal range of motion.  Skin:    General: Skin is warm.  Neurological:     General: No focal deficit present.     Mental Status: She is alert.  Psychiatric:        Mood and Affect: Mood normal.        Behavior: Behavior normal.        Thought Content: Thought content normal.        Judgment: Judgment normal.   Review of Systems  Constitutional: Negative.   HENT: Negative.    Eyes: Negative.   Respiratory: Negative.    Cardiovascular: Negative.   Gastrointestinal: Negative.   Genitourinary: Negative.   Musculoskeletal: Negative.   Skin: Negative.   Neurological: Negative.   Endo/Heme/Allergies: Negative.  Psychiatric/Behavioral:  The patient is nervous/anxious.   Blood pressure 120/78, pulse 78, temperature (!) 97.3 F (36.3 C), resp. rate 18, SpO2 100 %. There is no height or weight on file to calculate BMI.  Musculoskeletal: Strength & Muscle Tone: within normal limits Gait & Station: normal Patient leans: N/A   [facility] MSE Discharge  Disposition for Follow up and Recommendations: Based on my evaluation the patient does not appear to have an emergency medical condition and can be discharged with resources and follow up care in outpatient services for Medication Management   Sindy Guadeloupe, NP 01/30/2022, 3:27 AM

## 2022-01-30 DIAGNOSIS — F209 Schizophrenia, unspecified: Secondary | ICD-10-CM | POA: Diagnosis not present

## 2022-01-30 LAB — I-STAT BETA HCG BLOOD, ED (MC, WL, AP ONLY): I-stat hCG, quantitative: <5 m[IU]/mL (ref <5)

## 2022-01-30 LAB — RESP PANEL BY RT-PCR (FLU A&B, COVID) ARPGX2
Influenza A by PCR: NEGATIVE
Influenza B by PCR: NEGATIVE
SARS Coronavirus 2 by RT PCR: NEGATIVE

## 2022-01-30 MED ORDER — ZIPRASIDONE MESYLATE 20 MG IM SOLR
20.0000 mg | Freq: Once | INTRAMUSCULAR | Status: AC
  Administered 2022-01-30: 20 mg via INTRAMUSCULAR
  Filled 2022-01-30: qty 20

## 2022-01-30 MED ORDER — STERILE WATER FOR INJECTION IJ SOLN
INTRAMUSCULAR | Status: AC
  Filled 2022-01-30: qty 10

## 2022-01-30 NOTE — ED Notes (Addendum)
Became annoyed during attempt perform psych assessment and refused to speak with the evaluator.  TTS will be performed at a later time according to the evaluator.

## 2022-01-30 NOTE — BH Assessment (Signed)
TTS clinician attempted to complete assessment. Fayrene Fearing, RN, present during patient refusal, patient became upset. TTS will attempt at later time.

## 2022-01-30 NOTE — ED Notes (Signed)
Provider at bedside

## 2022-01-30 NOTE — ED Notes (Signed)
Has insisted on remaining in chair and sleeping.  Patient came out of her room stating she needed to clean herself stating she has urinated and has had a bm on herself.  She is also insistent that she can clean herself.  Patient sent to shower.

## 2022-01-30 NOTE — Progress Notes (Signed)
CSW requested [facility] Oluwatosin, RN to review pt for [facility]. CSW awaiting response.    Maryjean Ka, MSW, [facility] 01/30/2022 10:57 PM

## 2022-01-30 NOTE — ED Provider Notes (Signed)
[facility] EMERGENCY DEPARTMENT Provider Note   CSN: [id number] Arrival date & time: 01/29/22  2101     History  Chief Complaint  Patient presents with   IVC Psychiatric Treatment     Sally Wang is a 61 y.o. female.  The history is provided by medical records. The history is limited by the condition of the patient.  Mental Health Problem Presenting symptoms: aggressive behavior, agitation, bizarre behavior, disorganized speech, disorganized thought process and hallucinations   Degree of incapacity (severity):  Severe Onset quality:  Gradual Timing:  Constant Progression:  Worsening Chronicity:  Recurrent Context: noncompliance   Treatment compliance:  Untreated Relieved by:  Nothing Worsened by:  Nothing Ineffective treatments:  Anti-anxiety medications Risk factors: hx of mental illness       Home Medications Prior to Admission medications   Medication Sig Start Date End Date Taking? Authorizing Provider  amLODipine (NORVASC) 5 MG tablet Take 2 tablets (10 mg total) by mouth daily. 03/04/20   Sheikh, Omair Latif, DO  OLANZapine zydis (ZYPREXA) 5 MG disintegrating tablet Take 1 tablet (5 mg total) by mouth 2 (two) times daily. 03/04/20   Sheikh, Omair Latif, DO  ondansetron (ZOFRAN) 4 MG tablet Take 1 tablet (4 mg total) by mouth every 6 (six) hours as needed for nausea. 03/04/20   Merlene Laughter, DO      Allergies    Patient has no known allergies.    Review of Systems   Review of Systems  Unable to perform ROS: Psychiatric disorder  Constitutional:  Negative for fever.  HENT:  Negative for facial swelling.   Eyes:  Negative for redness.  Respiratory:  Negative for wheezing and stridor.   Psychiatric/Behavioral:  Positive for agitation and hallucinations.    Physical Exam Updated Vital Signs BP (!) 169/108   Pulse (!) 114   Temp 98 F (36.7 C)   Resp 18   SpO2 99%  Physical Exam Vitals and nursing note reviewed. Exam conducted  with a chaperone present.  Constitutional:      Appearance: Normal appearance. She is not diaphoretic.  HENT:     Head: Normocephalic and atraumatic.     Nose: Nose normal.  Eyes:     Conjunctiva/sclera: Conjunctivae normal.     Pupils: Pupils are equal, round, and reactive to light.  Cardiovascular:     Rate and Rhythm: Normal rate and regular rhythm.     Pulses: Normal pulses.     Heart sounds: Normal heart sounds.  Pulmonary:     Effort: Pulmonary effort is normal.     Breath sounds: Normal breath sounds.  Abdominal:     General: Abdomen is flat. Bowel sounds are normal.     Palpations: Abdomen is soft.     Tenderness: There is no abdominal tenderness. There is no guarding.  Musculoskeletal:        General: Normal range of motion.     Cervical back: Normal range of motion and neck supple.     Right lower leg: No edema.     Left lower leg: No edema.  Skin:    General: Skin is warm and dry.     Capillary Refill: Capillary refill takes less than 2 seconds.  Neurological:     General: No focal deficit present.     Mental Status: She is alert.     Deep Tendon Reflexes: Reflexes normal.  Psychiatric:        Behavior: Behavior is agitated and aggressive.  ED Results / Procedures / Treatments   Labs (all labs ordered are listed, but only abnormal results are displayed) Results for orders placed or performed during the hospital encounter of 01/29/22  Comprehensive metabolic panel  Result Value Ref Range   Sodium 138 135 - 145 mmol/L   Potassium 3.4 (L) 3.5 - 5.1 mmol/L   Chloride 105 98 - 111 mmol/L   CO2 25 22 - 32 mmol/L   Glucose, Bld 141 (H) 70 - 99 mg/dL   BUN 11 6 - 20 mg/dL   Creatinine, Ser 5.73 0.44 - 1.00 mg/dL   Calcium 9.1 8.9 - 22.0 mg/dL   Total Protein 7.7 6.5 - 8.1 g/dL   Albumin 3.7 3.5 - 5.0 g/dL   AST 19 15 - 41 U/L   ALT 18 0 - 44 U/L   Alkaline Phosphatase 81 38 - 126 U/L   Total Bilirubin 0.2 (L) 0.3 - 1.2 mg/dL   GFR, Estimated >25 >42 mL/min    Anion gap 8 5 - 15  Ethanol  Result Value Ref Range   Alcohol, Ethyl (B) <10 <10 mg/dL  CBC with Diff  Result Value Ref Range   WBC 4.8 4.0 - 10.5 K/uL   RBC 4.60 3.87 - 5.11 MIL/uL   Hemoglobin 13.4 12.0 - 15.0 g/dL   HCT 70.6 23.7 - 62.8 %   MCV 90.9 80.0 - 100.0 fL   MCH 29.1 26.0 - 34.0 pg   MCHC 32.1 30.0 - 36.0 g/dL   RDW 31.5 17.6 - 16.0 %   Platelets 280 150 - 400 K/uL   nRBC 0.0 0.0 - 0.2 %   Neutrophils Relative % 58 %   Neutro Abs 2.8 1.7 - 7.7 K/uL   Lymphocytes Relative 33 %   Lymphs Abs 1.6 0.7 - 4.0 K/uL   Monocytes Relative 6 %   Monocytes Absolute 0.3 0.1 - 1.0 K/uL   Eosinophils Relative 3 %   Eosinophils Absolute 0.1 0.0 - 0.5 K/uL   Basophils Relative 0 %   Basophils Absolute 0.0 0.0 - 0.1 K/uL   Immature Granulocytes 0 %   Abs Immature Granulocytes 0.01 0.00 - 0.07 K/uL  Acetaminophen level  Result Value Ref Range   Acetaminophen (Tylenol), Serum <10 (L) 10 - 30 ug/mL  Salicylate level  Result Value Ref Range   Salicylate Lvl <7.0 (L) 7.0 - 30.0 mg/dL  I-Stat beta hCG blood, ED  Result Value Ref Range   I-stat hCG, quantitative <5.0 <5 mIU/mL   Comment 3           No results found.  Radiology No results found.  Procedures Procedures    Medications Ordered in ED Medications  ziprasidone (GEODON) injection 20 mg (20 mg Intramuscular Given 01/30/22 0104)  sterile water (preservative free) injection (  Given 01/30/22 0104)    ED Course/ Medical Decision Making/ A&P                           Medical Decision Making Patient is off medication and is acting bizarrely and attempted to cook bacon with weeds from outside   Amount and/or Complexity of Data Reviewed Independent Historian:     Details: IVC paperwork see above External Data Reviewed: notes.    Details: [facility] note reviewed Labs: ordered.    Details: all labs reviewed: negative tylenol, salicylate and pregnancy test. negative ETOH, normal sodium and creatinine.  Liver function  tests normal.  Normal WBC  at 4.8, normal hemoglobin 13.4 ECG/medicine tests: ordered and independent interpretation performed.    Details: normal QTC  Risk Prescription drug management. Risk Details: Patient is under IVC The patient has been placed in psychiatric observation due to the need to provide a safe environment for the patient while obtaining psychiatric consultation and evaluation, as well as ongoing medical and medication management to treat the patient's condition.  The patient has been placed under full IVC at this time.      Final Clinical Impression(s) / ED Diagnoses Final diagnoses:  Involuntary commitment  Schizophrenia, unspecified type [facility](HCC)   Will need inpatient psychiatric care.  Holding orders placed my me  Rx / DC Orders ED Discharge Orders     None         Estephani Popper, MD 01/30/22 [id number]

## 2022-01-30 NOTE — ED Provider Notes (Signed)
Emergency Medicine Observation Re-evaluation Note  Sally Wang is a 61 y.o. female, seen on rounds today.  Pt initially presented to the ED for complaints of IVC Psychiatric Treatment  Currently, the patient is resting awaiting psychiatric evaluation.  Physical Exam  BP (!) 187/114 (BP Location: Left Arm)   Pulse (!) 102   Temp 98.2 F (36.8 C)   Resp 16   SpO2 98%  Physical Exam General: Resting Cardiac: No murmur on my initial exam Lungs: Clear Psych: No agitation  ED Course / MDM  EKG:   I have reviewed the labs performed to date as well as medications administered while in observation.  Recent changes in the last 24 hours include none reported by nursing.  Plan  Current plan is for awaiting psychiatric evaluation and recommendations. Sally Wang is under involuntary commitment.      Towanda Hornstein, Canary Brim, MD 01/30/22 [phone]

## 2022-01-30 NOTE — ED Notes (Signed)
Pt ambulated to restroom. 

## 2022-01-30 NOTE — ED Notes (Signed)
Upon arrival to unit patient was alert and somewhat cooperative and friendly.  Upon assessment she became agitated with nurse asking her the day of week and questions pertaining to her mental status.  She tends to answer questions and talks in word salad.

## 2022-01-30 NOTE — ED Notes (Signed)
Verbal report given to Marta Lamas RN at this time

## 2022-01-30 NOTE — ED Notes (Signed)
Pt given lunch tray.

## 2022-01-30 NOTE — ED Notes (Addendum)
Pt was offered help to lay down in bed or sit in a recliner because she was falling asleep in the chair, she refused. Pt was given a snack after stating she was hungry.

## 2022-01-30 NOTE — ED Notes (Signed)
Attempted to get pt changed into purple scrubs and pt refused. Security at bedside at this time. Attempted multiple times to get her changed and to do an assessment on the pt and pt wouldn't change her clothes and when questions were asked pt wouldn't answer she would start talking about something else. Provider made aware of same
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
## 2022-01-30 NOTE — ED Notes (Signed)
Patient was advised that we could get an order for some medicine to bring down her blood pressure.  She stated she doesn't take anything for her blood pressure and it was probably all the commotion of the day that was making her blood pressure high.

## 2022-01-30 NOTE — ED Notes (Signed)
TTS in process 

## 2022-01-31 MED ORDER — AMLODIPINE BESYLATE 5 MG PO TABS
10.0000 mg | ORAL_TABLET | Freq: Every day | ORAL | Status: DC
  Administered 2022-02-01 – 2022-02-02 (×2): 10 mg via ORAL
  Filled 2022-01-31 (×3): qty 2

## 2022-01-31 MED ORDER — OLANZAPINE 5 MG PO TBDP
5.0000 mg | ORAL_TABLET | Freq: Two times a day (BID) | ORAL | Status: DC
  Administered 2022-02-01 – 2022-02-02 (×3): 5 mg via ORAL
  Filled 2022-01-31 (×6): qty 1

## 2022-01-31 NOTE — ED Notes (Signed)
Patient did not eat her lunch, she threw the whole tray out

## 2022-01-31 NOTE — ED Notes (Signed)
patient has refused  EKG, medication and did not eat any of her lunch

## 2022-01-31 NOTE — ED Notes (Addendum)
Patient was witnessed by sitter to be peeing on floor. The sitter quickly alerted this RN and started to gather supplies to clean the urine off of the floor. When this Rn walked into the room, patient was cleaning up her own urine with chuck pads and a towel. When asked if she needed help, patient stated, "I'm fine. Don't worry, I'm cleaning it up for y'all, you won't have to lift a finger. I appreciate you wanting to help though." This RN provided a trash can for the patient to throw her chuck pads away and was able to dispose of used linen.

## 2022-01-31 NOTE — ED Notes (Signed)
Patients son called to check on patient. Note with his phone number provided for patient. She did not reach for the paper, so note placed on bedside table.

## 2022-01-31 NOTE — BH Assessment (Signed)
Patient was being considered for admission to [facility]. However, declined because she unfortunately will not complying to take her BP medications, her BP is too high for [facility] to take her. In the even ED staff is able to get her to  comply with meds and her BP is down that they will look at possibly accepting her again.

## 2022-01-31 NOTE — ED Notes (Addendum)
Patient requesting use of TV. TV was turned on for patient and finished meal tray was taken out of room. Patient was able to walk to bathroom and use the facilities.

## 2022-01-31 NOTE — Progress Notes (Signed)
Inpatient Behavioral Health Placement  Pt meets inpatient criteria per Doran Heater NP. There are no bed per [facility], RN.  Referral was sent to the following facilities;    Destination Service Provider Address Phone [facility]  [address] [place] [place] [address] [phone]  [facility]  [address] [place] [address] [phone]  [facility]  [address] [place] [address] [phone]  [facility] [address]  [address] [place] [address] [phone]  [facility]  [address] [place], [place] [address] [phone]  [facility] [place] [address] [phone]  [facility]  [address] [place] [address] [phone]  [facility]  [address] [place] [address] [phone]  [facility]  [address], [place] [address] [phone]  [facility]  [address] [place] [address] [phone]  [facility]  [address] [place] [address] [phone]  [facility]  [address], [place] [address] [phone]  [facility]  [address], [place] [address] [phone]  [facility]  [address], [place] [address] [phone]  [facility]  [address] [place] [address] [phone]  [facility]  [address] [place] [address] [phone]  [facility]  [address] [place] [address] [phone]  [facility]  [address], [place] [address] [phone]  [facility]  [address], [place] [address] [phone]   Situation ongoing,  CSW will follow up.   Maryjean Ka, MSW, LCSWA 01/31/2022  @ 1:11 AM

## 2022-01-31 NOTE — ED Notes (Signed)
[facility] called in regards to possibly taking this pt into their facility. This RN briefly talked to the facility and spoke with them about pt's history. When this RN told them about patient's recent BP (187/107) and her recent refusal to have her vitals taken, they asked for this RN to call back when her vitals were updated.  This RN was able to have patient comply with having her vitals taken, but her BP was 210/104 (as she has been refusing her medications as well). This RN called the facility with the updated VS and was told that the facility would not be able to take her with a BP that high and the patient continuing to refuse her medications.   This RN will attempt to have pt take her medications and will notify providers if there is any change in pt's manner.

## 2022-01-31 NOTE — ED Notes (Signed)
Patient showering

## 2022-01-31 NOTE — ED Notes (Signed)
Visitor at bedside.

## 2022-01-31 NOTE — ED Notes (Signed)
Pt refused to be changed into clean pants after staff attempted to clean/change pt following pt's urinary incontinence

## 2022-02-01 ENCOUNTER — Encounter (HOSPITAL_COMMUNITY): Payer: Self-pay | Admitting: Registered Nurse

## 2022-02-01 DIAGNOSIS — F209 Schizophrenia, unspecified: Secondary | ICD-10-CM | POA: Diagnosis not present

## 2022-02-01 NOTE — ED Notes (Signed)
Patient becoming agitated with this RN as retrieving vitals. Per previous shift patient had been refusing vitals and medications to address hypertension. Pt noted to still be hypertensive 208/114. HR in the 120s per radial pulse as patient is agitated at this time. Cindee Lame, [place] is aware.

## 2022-02-01 NOTE — Progress Notes (Signed)
[facility]/BMU LCSW Progress Note   02/01/2022    2:00 PM  Zykira ERIS HANNAN   [id number]   Type of Contact and Topic:  Psychiatric Bed Placement   Pt accepted to [facility]   Patient meets inpatient criteria per  Assunta Found, NP  The attending provider will be Dr. Betti Cruz   Call report to [phone]   Amelia Jo, RN @ [facility] ED notified.     Pt scheduled  to arrive 5/22 AFTER 0900   Signed:  Corky Crafts, MSW, [place], LCAS 02/01/2022 2:01 PM

## 2022-02-01 NOTE — ED Provider Notes (Cosign Needed Addendum)
Emergency Medicine Observation Re-evaluation Note  Lamiya P Eblin is a 61 y.o. female, seen on rounds today.  Pt initially presented to the ED for complaints of IVC Psychiatric Treatment  and Schizophrenia Currently, the patient is sitting upright in chair in no acute distress.  Patient is refusing any vitals or p.o. medications.  Physical Exam  BP (!) 208/114 (BP Location: Left Arm)   Pulse (!) 120   Temp 97.7 F (36.5 C) (Oral)   Resp 20   SpO2 100%  Physical Exam General: Alert in no acute distress Cardiac: Tachycardic Lungs: No acute respiratory distress Psych:   ED Course / MDM  EKG:   I have reviewed the labs performed to date as well as medications administered while in observation.  Recent changes in the last 24 hours include persistent hypertension and tachycardia.  Plan  Current plan is for inpatient admission, patient was accepted at [facility] however declined due to noncompliant with her blood pressure medications.Forestine Chute Riddle is under involuntary commitment.   It is unclear if patient's blood pressure is truly elevated or increased due to agitation and movement during blood pressure exam.  Patient is agreeable to taking her medications at this time.  We will recheck vital signs to see if patient can be transferred to outpatient facility.   Haskel Schroeder, PA-C 02/01/22 0918    Haskel Schroeder, PA-C 02/01/22 0930    Gwyneth Sprout, MD 02/05/22 [phone]

## 2022-02-01 NOTE — ED Notes (Signed)
Pt initially refused medication, RN explained compliance and that pt has a room at another facility and that medication compliance is important to get better. Pt accepted medication.

## 2022-02-01 NOTE — Consult Note (Signed)
  Reason for Consult:  IVC, psychosis, and aggression Referring Physician:  Cy Blamer, MD Location of Patient: [facility] ED Location of Provider:  Other: [facility]        Sally Wang 61 y.o.  female admitted to [facility] ED after sent from [facility] under IVC petitioned by her husband who is also her guardian:  Per IVC:  respondent has been previously diagnosed with schizophrenia, she has medications for her condition but is noncompliant with her medication regiment.  She is having a medical issue in addition to her mental health issues.  She has symptoms of medical issues but is refusing medical treatment.  She is acting erratically, RE: she made bacon with weeds that she pulled from the yard.  Respondent is combative and argumentative with family members, family's concern for her wellbeing as she is continued to decline mentally and physically."   Sally Wang, 61 y.o., female patient seen via tele health for psychiatric evaluation by this provider, consulted with Dr. Nelly Rout; and chart reviewed on 02/01/22.  On evaluation Sally Wang she refuses to give answers to why she is int the hospital and refused to participate at first.   During evaluation Sally Wang is sitting up in chair with no distress noted.  She is alert and oriented x 4 .  She is calm and somewhat cooperative; although reluctant to continue with psychiatric assessment.   She states that she doesn't feel like participating in assessment "I don't want to answer any questions.  They can tell you why I'm here.  They try to make it like I'm crazy."  She continues to be argumentative at times with staff but did take her blood pressure and other medications today.  Her mood is euthymic with congruent affect.  She does not appear to be responding to internal/external stimuli or delusional thoughts at this time but; but she does appear to have some paranoia.  She denies suicidal/self-harm/homicidal ideation, psychosis, and paranoia.  She feels  that she was brought to the ED related to a disagreement with family and that they are trying to make her look crazy.    Disposition: Recommend psychiatric Inpatient admission when medically cleared.   Continue to recommend inpatient psychiatric treatment.     Secure message sent to social work informing that patient continues to meet inpatient psychiatric treatment and patient was denied at [facility] yesterday related to not taking her blood pressure medication.  She is taking today and the RN reached out to them.  There are wanting her information resent to them.    Patient has been accepted to [facility] 02/03/24 accepting provider Dr. Betti Cruz  This service was provided via telemedicine using a 2-way, interactive audio and video technology.  Names of all persons participating in this telemedicine service and their role in this encounter. Name: Jamaree Hosier Role: NP  Name: Sally Wang Role: Patient  Name:  Role:   Name:  Role:

## 2022-02-01 NOTE — ED Notes (Signed)
Left VM for Sgt Cole with [facility] on need for transport of patient to [facility] tomorrow 02/02/22 after 9 am.

## 2022-02-01 NOTE — ED Notes (Addendum)
This RN spoke to intake representative at [facility]. Pt previously declined due to being too hypertensive and noncompliant with BP meds. Pt now with stable BP and compliant with medications and facility now willing to accept patient tomorrow 02/02/22 after 9 am.  Patient will arrive to unit [facility]. Accepting MD: Otho Perl

## 2022-02-02 DIAGNOSIS — F209 Schizophrenia, unspecified: Secondary | ICD-10-CM | POA: Diagnosis not present

## 2022-02-02 DIAGNOSIS — Z20822 Contact with and (suspected) exposure to covid-19: Secondary | ICD-10-CM | POA: Diagnosis not present

## 2022-02-02 DIAGNOSIS — F29 Unspecified psychosis not due to a substance or known physiological condition: Secondary | ICD-10-CM | POA: Diagnosis not present

## 2022-02-02 DIAGNOSIS — Z91128 Patient's intentional underdosing of medication regimen for other reason: Secondary | ICD-10-CM | POA: Diagnosis not present

## 2022-02-02 DIAGNOSIS — Z046 Encounter for general psychiatric examination, requested by authority: Secondary | ICD-10-CM | POA: Diagnosis not present

## 2022-02-02 DIAGNOSIS — Z7984 Long term (current) use of oral hypoglycemic drugs: Secondary | ICD-10-CM | POA: Diagnosis not present

## 2022-02-02 DIAGNOSIS — I10 Essential (primary) hypertension: Secondary | ICD-10-CM | POA: Diagnosis not present

## 2022-02-02 DIAGNOSIS — E119 Type 2 diabetes mellitus without complications: Secondary | ICD-10-CM | POA: Diagnosis not present

## 2022-02-02 DIAGNOSIS — R451 Restlessness and agitation: Secondary | ICD-10-CM | POA: Diagnosis not present

## 2022-02-02 DIAGNOSIS — Z91148 Patient's other noncompliance with medication regimen for other reason: Secondary | ICD-10-CM | POA: Diagnosis not present

## 2022-02-02 DIAGNOSIS — Z79899 Other long term (current) drug therapy: Secondary | ICD-10-CM | POA: Diagnosis not present

## 2022-02-02 DIAGNOSIS — T50916A Underdosing of multiple unspecified drugs, medicaments and biological substances, initial encounter: Secondary | ICD-10-CM | POA: Diagnosis not present

## 2022-02-02 DIAGNOSIS — F250 Schizoaffective disorder, bipolar type: Secondary | ICD-10-CM | POA: Diagnosis not present

## 2022-02-02 NOTE — ED Notes (Signed)
Breakfast order placed ?

## 2022-02-02 NOTE — ED Provider Notes (Signed)
Emergency Medicine Observation Re-evaluation Note  Sally Wang is a 61 y.o. female, seen on rounds today.  Pt initially presented to the ED for complaints of IVC Psychiatric Treatment  and Schizophrenia Currently, the patient is resting in their room.  Physical Exam  BP (!) 161/106 (BP Location: Left Arm)   Pulse (!) 103   Temp 97.8 F (36.6 C) (Oral)   Resp 20   Ht 5\' 7"  (1.702 m)   Wt 105 kg   SpO2 97%   BMI 36.26 kg/m  Physical Exam General: resting comfortably, NAD Lungs: normal WOB Psych: currently calm and resting  ED Course / MDM  EKG:   I have reviewed the labs performed to date as well as medications administered while in observation.  Recent changes in the last 24 hours include accepted to .  Plan  Currently patient has been accepted and is being transferred to [facility], EMTALA completed. Stable at time of transfer. Sally Wang is under involuntary commitment.      [facility], DO 02/02/22 0840

## 2022-04-07 DIAGNOSIS — F250 Schizoaffective disorder, bipolar type: Secondary | ICD-10-CM | POA: Diagnosis not present

## 2022-04-24 DIAGNOSIS — F250 Schizoaffective disorder, bipolar type: Secondary | ICD-10-CM | POA: Diagnosis not present

## 2022-05-29 DIAGNOSIS — F250 Schizoaffective disorder, bipolar type: Secondary | ICD-10-CM | POA: Diagnosis not present

## 2022-07-02 DIAGNOSIS — F250 Schizoaffective disorder, bipolar type: Secondary | ICD-10-CM | POA: Diagnosis not present

## 2022-10-22 ENCOUNTER — Emergency Department (HOSPITAL_COMMUNITY): Payer: Medicare HMO

## 2022-10-22 ENCOUNTER — Other Ambulatory Visit: Payer: Self-pay

## 2022-10-22 ENCOUNTER — Emergency Department (HOSPITAL_COMMUNITY)
Admission: EM | Admit: 2022-10-22 | Discharge: 2022-10-25 | Disposition: A | Discharge status: Other Acute Inpatient Hospital | Payer: Medicare HMO | Attending: Emergency Medicine | Admitting: Emergency Medicine

## 2022-10-22 ENCOUNTER — Encounter (HOSPITAL_COMMUNITY): Payer: Self-pay

## 2022-10-22 DIAGNOSIS — R000 Tachycardia, unspecified: Secondary | ICD-10-CM | POA: Insufficient documentation

## 2022-10-22 DIAGNOSIS — I10 Essential (primary) hypertension: Secondary | ICD-10-CM | POA: Diagnosis not present

## 2022-10-22 DIAGNOSIS — R600 Localized edema: Secondary | ICD-10-CM | POA: Diagnosis not present

## 2022-10-22 DIAGNOSIS — I700 Atherosclerosis of aorta: Secondary | ICD-10-CM | POA: Diagnosis not present

## 2022-10-22 DIAGNOSIS — F259 Schizoaffective disorder, unspecified: Secondary | ICD-10-CM | POA: Diagnosis present

## 2022-10-22 DIAGNOSIS — F22 Delusional disorders: Secondary | ICD-10-CM | POA: Diagnosis present

## 2022-10-22 DIAGNOSIS — I517 Cardiomegaly: Secondary | ICD-10-CM | POA: Diagnosis not present

## 2022-10-22 DIAGNOSIS — Z20822 Contact with and (suspected) exposure to covid-19: Secondary | ICD-10-CM | POA: Diagnosis not present

## 2022-10-22 DIAGNOSIS — F29 Unspecified psychosis not due to a substance or known physiological condition: Secondary | ICD-10-CM | POA: Insufficient documentation

## 2022-10-22 DIAGNOSIS — Z79899 Other long term (current) drug therapy: Secondary | ICD-10-CM | POA: Insufficient documentation

## 2022-10-22 DIAGNOSIS — R451 Restlessness and agitation: Secondary | ICD-10-CM | POA: Diagnosis present

## 2022-10-22 LAB — CBC WITH DIFFERENTIAL/PLATELET
Abs Immature Granulocytes: 0.02 10*3/uL (ref 0.00–0.07)
Basophils Absolute: 0 10*3/uL (ref 0.0–0.1)
Basophils Relative: 0 %
Eosinophils Absolute: 0 10*3/uL (ref 0.0–0.5)
Eosinophils Relative: 1 %
HCT: 40.4 % (ref 36.0–46.0)
Hemoglobin: 12.8 g/dL (ref 12.0–15.0)
Immature Granulocytes: 0 %
Lymphocytes Relative: 26 %
Lymphs Abs: 1.3 10*3/uL (ref 0.7–4.0)
MCH: 29.4 pg (ref 26.0–34.0)
MCHC: 31.7 g/dL (ref 30.0–36.0)
MCV: 92.7 fL (ref 80.0–100.0)
Monocytes Absolute: 0.2 10*3/uL (ref 0.1–1.0)
Monocytes Relative: 5 %
Neutro Abs: 3.5 10*3/uL (ref 1.7–7.7)
Neutrophils Relative %: 68 %
Platelets: 261 10*3/uL (ref 150–400)
RBC: 4.36 MIL/uL (ref 3.87–5.11)
RDW: 12.8 % (ref 11.5–15.5)
WBC: 5.2 10*3/uL (ref 4.0–10.5)
nRBC: 0 % (ref 0.0–0.2)

## 2022-10-22 LAB — COMPREHENSIVE METABOLIC PANEL
ALT: 19 U/L (ref 0–44)
AST: 19 U/L (ref 15–41)
Albumin: 3.7 g/dL (ref 3.5–5.0)
Alkaline Phosphatase: 68 U/L (ref 38–126)
Anion gap: 10 (ref 5–15)
BUN: 23 mg/dL (ref 8–23)
CO2: 26 mmol/L (ref 22–32)
Calcium: 9.5 mg/dL (ref 8.9–10.3)
Chloride: 101 mmol/L (ref 98–111)
Creatinine, Ser: 0.77 mg/dL (ref 0.44–1.00)
GFR, Estimated: >60 mL/min (ref >60)
Glucose, Bld: 96 mg/dL (ref 70–99)
Potassium: 4.1 mmol/L (ref 3.5–5.1)
Sodium: 137 mmol/L (ref 135–145)
Total Bilirubin: 0.5 mg/dL (ref 0.3–1.2)
Total Protein: 7.5 g/dL (ref 6.5–8.1)

## 2022-10-22 LAB — RAPID URINE DRUG SCREEN, HOSP PERFORMED
Amphetamines: NOT DETECTED
Barbiturates: NOT DETECTED
Benzodiazepines: NOT DETECTED
Cocaine: NOT DETECTED
Opiates: NOT DETECTED
Tetrahydrocannabinol: NOT DETECTED

## 2022-10-22 LAB — ACETAMINOPHEN LEVEL: Acetaminophen (Tylenol), Serum: <10 ug/mL — ABNORMAL LOW (ref 10–30)

## 2022-10-22 LAB — ETHANOL: Alcohol, Ethyl (B): <10 mg/dL (ref <10)

## 2022-10-22 LAB — SALICYLATE LEVEL: Salicylate Lvl: <7 mg/dL — ABNORMAL LOW (ref 7.0–30.0)

## 2022-10-22 LAB — BRAIN NATRIURETIC PEPTIDE: B Natriuretic Peptide: 43.8 pg/mL (ref 0.0–100.0)

## 2022-10-22 MED ORDER — ZIPRASIDONE MESYLATE 20 MG IM SOLR
20.0000 mg | INTRAMUSCULAR | Status: AC | PRN
  Administered 2022-10-22: 20 mg via INTRAMUSCULAR
  Filled 2022-10-22: qty 20

## 2022-10-22 MED ORDER — LORAZEPAM 1 MG PO TABS: 1.0000 mg | ORAL_TABLET | ORAL | Status: DC | PRN

## 2022-10-22 MED ORDER — OLANZAPINE 5 MG PO TBDP
5.0000 mg | ORAL_TABLET | Freq: Three times a day (TID) | ORAL | Status: DC | PRN
  Administered 2022-10-23: 5 mg via ORAL
  Filled 2022-10-22: qty 1

## 2022-10-22 MED ORDER — AMLODIPINE BESYLATE 5 MG PO TABS
10.0000 mg | ORAL_TABLET | Freq: Every day | ORAL | Status: DC
  Administered 2022-10-23: 10 mg via ORAL
  Filled 2022-10-22 (×3): qty 2

## 2022-10-22 MED ORDER — STERILE WATER FOR INJECTION IJ SOLN
INTRAMUSCULAR | Status: AC
  Administered 2022-10-22: 1.2 mL
  Filled 2022-10-22: qty 10

## 2022-10-22 NOTE — ED Provider Notes (Signed)
[place] EMERGENCY DEPARTMENT AT [facility] Provider Note   CSN: [id number] Arrival date & time: 10/22/22  1820     History  Chief Complaint  Patient presents with   Paranoid    IVC     Sally Wang is a 62 y.o. female.  Patient is a 62 year old female with a history of hypertension and schizoaffective disorder who is presenting today under IVC with the police coming from home.  Family took out paperwork because patient has gradually been worsening over the last 6 to 8 months.  She has been off medication since May and they report her delusions are getting worse.  She sits in the floor and urinates, rooms out in the middle of the night and they have to go get her.  She will cook food and then she washes it off because the poison is going to get to her.  She is not caring for herself, given her roaming at night is a threat to herself.  She is refusing to take the medications.  Family reports that they are concerned for her wellbeing and do not feel like things can continue the way they are at home.  She does not use drugs or alcohol.  The history is provided by a caregiver and the police.       Home Medications Prior to Admission medications   Medication Sig Start Date End Date Taking? Authorizing Provider  amLODipine (NORVASC) 5 MG tablet Take 2 tablets (10 mg total) by mouth daily. Patient not taking: Reported on 01/30/2022 03/04/20   Raiford Noble Latif, DO  OLANZapine zydis (ZYPREXA) 5 MG disintegrating tablet Take 1 tablet (5 mg total) by mouth 2 (two) times daily. Patient not taking: Reported on 01/30/2022 03/04/20   Raiford Noble Latif, DO  ondansetron (ZOFRAN) 4 MG tablet Take 1 tablet (4 mg total) by mouth every 6 (six) hours as needed for nausea. Patient not taking: Reported on 01/30/2022 03/04/20   Kerney Elbe, DO      Allergies    Patient has no known allergies.    Review of Systems   Review of Systems  Physical Exam Updated Vital Signs BP (!)  159/128   Pulse (!) 110   Temp (!) 97.5 F (36.4 C) (Oral)   Resp 16   Ht 5\' 7"  (1.702 m)   Wt 105 kg   LMP 02/16/2015   SpO2 98%   BMI 36.26 kg/m  Physical Exam Constitutional:      Comments: Agitated  HENT:     Head: Normocephalic and atraumatic.     Mouth/Throat:     Mouth: Mucous membranes are moist.  Eyes:     Pupils: Pupils are equal, round, and reactive to light.  Cardiovascular:     Rate and Rhythm: Tachycardia present.  Musculoskeletal:     Cervical back: Normal range of motion and neck supple.     Right lower leg: Edema present.     Left lower leg: Edema present.     Comments: From a distance visual inspection shows that patient has swelling in her legs.  Her hands also appear swollen but when attempting to evaluate she becomes very angry and will not let me look at her hands.  Neurological:     Mental Status: She is alert and oriented to person, place, and time. Mental status is at baseline.     Comments: Able to ambulate without difficulty, moving arms and legs with no problems.  Psychiatric:  Attention and Perception: She is inattentive.        Mood and Affect: Affect is labile.        Speech: She is noncommunicative. Speech is rapid and pressured and tangential.        Behavior: Behavior is uncooperative and agitated.        Thought Content: Thought content is delusional.     Comments: Patient is agitated with labile affect.  As soon as I try to get close to her she starts shaking her fist in my face, raising her voice and becoming upset.  Patient is talking about things that do not make sense and is very tangential.  Patient's son is present with her and she only will talk with him but does not want to talk to me     ED Results / Procedures / Treatments   Labs (all labs ordered are listed, but only abnormal results are displayed) Labs Reviewed  SALICYLATE LEVEL - Abnormal; Notable for the following components:      Result Value   Salicylate Lvl <1.0  (*)    All other components within normal limits  ACETAMINOPHEN LEVEL - Abnormal; Notable for the following components:   Acetaminophen (Tylenol), Serum <10 (*)    All other components within normal limits  CBC WITH DIFFERENTIAL/PLATELET  COMPREHENSIVE METABOLIC PANEL  ETHANOL  RAPID URINE DRUG SCREEN, HOSP PERFORMED  BRAIN NATRIURETIC PEPTIDE  URINALYSIS, ROUTINE W REFLEX MICROSCOPIC    EKG EKG Interpretation  Date/Time:  Thursday October 22 2022 21:21:19 EST Ventricular Rate:  109 PR Interval:  143 QRS Duration: 84 QT Interval:  354 QTC Calculation: 477 R Axis:   2 Text Interpretation: Sinus tachycardia Probable left atrial enlargement No significant change since last tracing Confirmed by Blanchie Dessert ([id number]) on 10/22/2022 9:49:10 PM  Radiology DG Chest Port 1 View  Result Date: 10/22/2022 CLINICAL DATA:  Medical clearance EXAM: PORTABLE CHEST 1 VIEW COMPARISON:  None Available. FINDINGS: Cardiomegaly. No acute airspace disease or effusion. No pneumothorax. Mild aortic atherosclerosis IMPRESSION: No active disease. Cardiomegaly. Electronically Signed   By: Donavan Foil M.D.   On: 10/22/2022 21:54    Procedures Procedures    Medications Ordered in ED Medications  OLANZapine zydis (ZYPREXA) disintegrating tablet 5 mg (has no administration in time range)    And  LORazepam (ATIVAN) tablet 1 mg (has no administration in time range)    And  ziprasidone (GEODON) injection 20 mg (20 mg Intramuscular Given 10/22/22 2027)  amLODipine (NORVASC) tablet 10 mg (has no administration in time range)  sterile water (preservative free) injection (1.2 mLs  Given 10/22/22 2028)    ED Course/ Medical Decision Making/ A&P                             Medical Decision Making Amount and/or Complexity of Data Reviewed Labs: ordered. Radiology: ordered.  Risk Prescription drug management.   Pt with multiple medical problems and comorbidities and presenting today with a complaint  that caries a high risk for morbidity and mortality.  Here today with exacerbation of her mental illness.  Patient's vital signs are reassuring.  Screening labs are pending.  Feel that patient will need evaluation by TTS.  IVC initiated by family and first exam done here. Patient became very agitated when attempting to examine her.  She does appear to have swelling of her hands and feet.  Unclear if this is contact dermatitis related from continually washing  her hands or being on her feet constantly.  Patient is very agitated and is under IVC.  Her son is present on exam and discussed with him the need to get blood work to medically clear her.  The decision was made as patient would not take oral medications to do IM medications for agitation.  10:09 PM Patient required IM medications but is now calm.  I independently interpreted her labs and EKG and CBC, CMP within normal limits, salicylates, acetaminophen and alcohol level are all normal, UDS is negative.  I have independently visualized and interpreted pt's images today.  Chest x-ray without acute findings.  EKG within normal limits other than sinus tachycardia.  Feel that patient is medically clear for psychiatric evaluation.  She is hypertensive and has a history of hypertension and has not been on medications.  Will start patient back on amlodipine.          Final Clinical Impression(s) / ED Diagnoses Final diagnoses:  Psychosis, unspecified psychosis type [facility])    Rx / DC Orders ED Discharge Orders     None         Blanchie Dessert, MD 10/22/22 2209

## 2022-10-22 NOTE — ED Notes (Addendum)
Pt refused vitals, tried to get her to change out into purple scrubs pt put them on top of clothing she already had on. Urine sample at desk.

## 2022-10-22 NOTE — ED Notes (Signed)
Pt changed into purple scrubs. Pt belongings given to daughter.

## 2022-10-22 NOTE — ED Notes (Signed)
Pt cooperative during blood draw and EKG. Pt offered drink and food/pt refused.

## 2022-10-22 NOTE — ED Notes (Signed)
Pt is uncooperative at this time/ waving finger and cursing at this nurse and other staff/ daughter at bedside / pt had to be moved to room d/t aggressive behavior.

## 2022-10-22 NOTE — ED Notes (Signed)
Pt belongings given to daughter Brayton Layman) pts purse, jacket, pants, shoes, shirt/

## 2022-10-23 DIAGNOSIS — F22 Delusional disorders: Secondary | ICD-10-CM | POA: Diagnosis not present

## 2022-10-23 LAB — URINALYSIS, ROUTINE W REFLEX MICROSCOPIC
Bilirubin Urine: NEGATIVE
Glucose, UA: NEGATIVE mg/dL
Ketones, ur: NEGATIVE mg/dL
Leukocytes,Ua: NEGATIVE
Nitrite: NEGATIVE
Protein, ur: NEGATIVE mg/dL
Specific Gravity, Urine: 1.018 (ref 1.005–1.030)
pH: 5 (ref 5.0–8.0)

## 2022-10-23 MED ORDER — RISPERIDONE 2 MG PO TABS
4.0000 mg | ORAL_TABLET | Freq: Two times a day (BID) | ORAL | Status: DC
  Administered 2022-10-23 (×2): 4 mg via ORAL
  Filled 2022-10-23 (×2): qty 8
  Filled 2022-10-23 (×2): qty 2

## 2022-10-23 MED ORDER — CLONIDINE HCL 0.1 MG PO TABS
0.2000 mg | ORAL_TABLET | Freq: Two times a day (BID) | ORAL | Status: DC
  Administered 2022-10-23 (×2): 0.2 mg via ORAL
  Filled 2022-10-23 (×4): qty 2

## 2022-10-23 MED ORDER — OLANZAPINE 5 MG PO TBDP
5.0000 mg | ORAL_TABLET | Freq: Two times a day (BID) | ORAL | Status: DC
  Administered 2022-10-23: 5 mg via ORAL
  Filled 2022-10-23 (×4): qty 1

## 2022-10-23 MED ORDER — DIVALPROEX SODIUM ER 250 MG PO TB24
250.0000 mg | ORAL_TABLET | Freq: Two times a day (BID) | ORAL | Status: DC
  Administered 2022-10-23 (×2): 250 mg via ORAL
  Filled 2022-10-23 (×4): qty 1

## 2022-10-23 NOTE — Consult Note (Cosign Needed Addendum)
[facility] ED ASSESSMENT   Reason for Consult:  agitated, paranoid, delusional Referring Physician:  Dr. Maryan Rued Patient Identification: Sally Wang MRN:  [id number] ED Chief Complaint: Delusional disorder [facility])  Diagnosis:  Principal Problem:   Delusional disorder ([place]) Active Problems:   Schizoaffective disorder [facility])   ED Assessment Time Calculation: Start Time: 0945 Stop Time: 1020 Total Time in Minutes (Assessment Completion): 35   HPI: Per MD note from caregiver "Patient has been off medication since May and they report her delusions are getting worse. She sits in the floor and urinates, rooms out in the middle of the night and they have to go get her. She will cook food and then she washes it off because the poison is going to get to her. She is not caring for herself, given her roaming at night is a threat to herself. She is refusing to take the medications".    Subjective:  Sally Wang, 62 y.o., female patient seen face to face by this provider, consulted with Dr. Dwyane Dee; and chart reviewed on 10/23/22.  During evaluation Sally Wang is sitting on hospital gurney in no acute distress. She is alert, partially oriented, cooperative. Her mood is hyper-euthymic with congruent affect. She has normal speech, increased volume, and behavior.  Patient is hyperverbal and extremely hyperreligious, as she answered questions she quotes scriptures, and refers back to the Bible.  She does not fully answer questions, she speaks very vague and general, provider asked why she is here and she states that "when people live with you who are not the same as you, they treat you different and make you feel like, you are the one with issues.  "She continues to say that these people will not make her feel bad for not wanting to do drugs or be promiscuous, I the church, I am the pastor, I am everything".  Provider asked who that she lives with, she states "the question is, who lives with me, I am the owner, I am the  lender, they are the borrowers".  Patient then stated that her brother lives with her, and she believes in helping others so that she can serve her God.  Patient states that she is not on any medications, and that her appetite and sleep are good.  When asked if she had children she said, "I have 3 children, they are my spirits, they are created by her father. "Patient feels that she was brought to the ED because her family do not agree with things that she is doing and they are trying to make her look like she has issues and is crazy.  Past Psychiatric History: Schizophrenia  Risk to Self or Others: Is the patient at risk to self? No Has the patient been a risk to self in the past 6 months? No Has the patient been a risk to self within the distant past? No Is the patient a risk to others? No Has the patient been a risk to others in the past 6 months? No Has the patient been a risk to others within the distant past? No  [place] Scale:  [place] ED from 01/29/2022 in [facility] Emergency Department at [facility] Admission (Discharged) from 01/26/2020 in [place] [facility]  C-SSRS RISK CATEGORY No Risk No Risk       AIMS:  , , ,  ,   ASAM:    Substance Abuse:     Past Medical History:  Past Medical  History:  Diagnosis Date   Hypertension    Schizoaffective disorder [facility])     Past Surgical History:  Procedure Laterality Date   CHOLECYSTECTOMY  [id number]   UMBILICAL HERNIA REPAIR N/A 02/29/2020   Procedure: HERNIA REPAIR UMBILICAL ADULT;  Surgeon: Coralie Keens, MD;  Location: [facility] ORS;  Service: General;  Laterality: N/A;   Family History:  Family History  Problem Relation Age of Onset   Coronary artery disease Father        died 1 yo   Diabetes Mother        died 67   Hypertension Brother      Social History:  Social History   Substance and Sexual Activity  Alcohol Use No     Social History   Substance and Sexual Activity   Drug Use No    Social History   Socioeconomic History   Marital status: Married    Spouse name: Hoy Morn   Number of children: Not on file   Years of education: Not on file   Highest education level: Not on file  Occupational History   Occupation: homemaker  Tobacco Use   Smoking status: Never   Smokeless tobacco: Never  Vaping Use   Vaping Use: Never used  Substance and Sexual Activity   Alcohol use: No   Drug use: No   Sexual activity: Not Currently  Other Topics Concern   Not on file  Social History Narrative   Husband - Corning -2010 granddaughte      Pets -  Pit bulls #4            Social Determinants of Health   Financial Resource Strain: Not on file  Food Insecurity: Not on file  Transportation Needs: Not on file  Physical Activity: Not on file  Stress: Not on file  Social Connections: Not on file      Allergies:  No Known Allergies  Labs:  Results for orders placed or performed during the hospital encounter of 10/22/22 (from the past 48 hour(s))  Urinalysis, Routine w reflex microscopic -Urine, Clean Catch     Status: Abnormal   Collection Time: 10/22/22  9:02 PM  Result Value Ref Range   Color, Urine STRAW (A) YELLOW   APPearance CLEAR CLEAR   Specific Gravity, Urine 1.018 1.005 - 1.030   pH 5.0 5.0 - 8.0   Glucose, UA NEGATIVE NEGATIVE mg/dL   Hgb urine dipstick SMALL (A) NEGATIVE   Bilirubin Urine NEGATIVE NEGATIVE   Ketones, ur NEGATIVE NEGATIVE mg/dL   Protein, ur NEGATIVE NEGATIVE mg/dL   Nitrite NEGATIVE NEGATIVE   Leukocytes,Ua NEGATIVE NEGATIVE   RBC / HPF 0-5 0 - 5 RBC/hpf   WBC, UA 0-5 0 - 5 WBC/hpf   Bacteria, UA RARE (A) NONE SEEN   Squamous Epithelial / HPF 0-5 0 - 5 /HPF    Comment: Performed at [facility], [place] [address] [place], [place] [address]  Rapid urine drug screen (hospital performed)     Status: None   Collection Time: 10/22/22  9:02 PM  Result Value Ref  Range   Opiates NONE DETECTED NONE DETECTED   Cocaine NONE DETECTED NONE DETECTED   Benzodiazepines NONE DETECTED NONE DETECTED   Amphetamines NONE DETECTED NONE DETECTED   Tetrahydrocannabinol NONE DETECTED NONE DETECTED   Barbiturates NONE DETECTED NONE DETECTED    Comment: (NOTE) DRUG SCREEN FOR MEDICAL PURPOSES ONLY.  IF CONFIRMATION IS NEEDED FOR  ANY PURPOSE, NOTIFY LAB WITHIN 5 DAYS.  LOWEST DETECTABLE LIMITS FOR URINE DRUG SCREEN Drug Class                     Cutoff (ng/mL) Amphetamine and metabolites    1000 Barbiturate and metabolites    200 Benzodiazepine                 200 Opiates and metabolites        300 Cocaine and metabolites        300 THC                            50 Performed at [facility], [place] [address], [place], [place] [address]   CBC with Differential/Platelet     Status: None   Collection Time: 10/22/22  9:21 PM  Result Value Ref Range   WBC 5.2 4.0 - 10.5 K/uL   RBC 4.36 3.87 - 5.11 MIL/uL   Hemoglobin 12.8 12.0 - 15.0 g/dL   HCT 40.4 36.0 - 46.0 %   MCV 92.7 80.0 - 100.0 fL   MCH 29.4 26.0 - 34.0 pg   MCHC 31.7 30.0 - 36.0 g/dL   RDW 12.8 11.5 - 15.5 %   Platelets 261 150 - 400 K/uL   nRBC 0.0 0.0 - 0.2 %   Neutrophils Relative % 68 %   Neutro Abs 3.5 1.7 - 7.7 K/uL   Lymphocytes Relative 26 %   Lymphs Abs 1.3 0.7 - 4.0 K/uL   Monocytes Relative 5 %   Monocytes Absolute 0.2 0.1 - 1.0 K/uL   Eosinophils Relative 1 %   Eosinophils Absolute 0.0 0.0 - 0.5 K/uL   Basophils Relative 0 %   Basophils Absolute 0.0 0.0 - 0.1 K/uL   Immature Granulocytes 0 %   Abs Immature Granulocytes 0.02 0.00 - 0.07 K/uL    Comment: Performed at [facility], [place] [address], [place], [place] [address]  Comprehensive metabolic panel     Status: None   Collection Time: 10/22/22  9:21 PM  Result Value Ref Range   Sodium 137 135 - 145 mmol/L   Potassium 4.1 3.5 - 5.1 mmol/L   Chloride 101 98 - 111 mmol/L   CO2 26 22 -  32 mmol/L   Glucose, Bld 96 70 - 99 mg/dL    Comment: Glucose reference range applies only to samples taken after fasting for at least 8 hours.   BUN 23 8 - 23 mg/dL   Creatinine, Ser 0.77 0.44 - 1.00 mg/dL   Calcium 9.5 8.9 - 10.3 mg/dL   Total Protein 7.5 6.5 - 8.1 g/dL   Albumin 3.7 3.5 - 5.0 g/dL   AST 19 15 - 41 U/L   ALT 19 0 - 44 U/L   Alkaline Phosphatase 68 38 - 126 U/L   Total Bilirubin 0.5 0.3 - 1.2 mg/dL   GFR, Estimated >60 >60 mL/min    Comment: (NOTE) Calculated using the CKD-EPI Creatinine Equation (2021)    Anion gap 10 5 - 15    Comment: Performed at [facility], [place] [address], [place], [place] [address]  Brain natriuretic peptide     Status: None   Collection Time: 10/22/22  9:21 PM  Result Value Ref Range   B Natriuretic Peptide 43.8 0.0 - 100.0 pg/mL    Comment: Performed at [facility], [place] [address]  [place], [place] [address]  Ethanol     Status: None   Collection Time: 10/22/22  9:21 PM  Result Value Ref Range   Alcohol, Ethyl (B) <10 <10 mg/dL    Comment: (NOTE) Lowest detectable limit for serum alcohol is 10 mg/dL.  For medical purposes only. Performed at [facility], [place] [address], [place], [place] [id number]   Salicylate level     Status: Abnormal   Collection Time: 10/22/22  9:21 PM  Result Value Ref Range   Salicylate Lvl Q000111Q (L) 7.0 - 30.0 mg/dL    Comment: Performed at [facility], [place] [address] [place],  [address]  Acetaminophen level     Status: Abnormal   Collection Time: 10/22/22  9:21 PM  Result Value Ref Range   Acetaminophen (Tylenol), Serum <10 (L) 10 - 30 ug/mL    Comment: (NOTE) Therapeutic concentrations vary significantly. A range of 10-30 ug/mL  may be an effective concentration for many patients. However, some  are best treated at concentrations outside of this range. Acetaminophen concentrations >150 ug/mL at 4 hours after ingestion   and >50 ug/mL at 12 hours after ingestion are often associated with  toxic reactions.  Performed at [facility], [place] [address], [place],  [address]     Current Facility-Administered Medications  Medication Dose Route Frequency Provider Last Rate Last Admin   amLODipine (NORVASC) tablet 10 mg  10 mg Oral Daily Blanchie Dessert, MD   10 mg at 10/23/22 0900   OLANZapine zydis (ZYPREXA) disintegrating tablet 5 mg  5 mg Oral Q8H PRN Blanchie Dessert, MD   5 mg at 10/23/22 [id number]   And   LORazepam (ATIVAN) tablet 1 mg  1 mg Oral PRN Blanchie Dessert, MD       Current Outpatient Medications  Medication Sig Dispense Refill   amLODipine (NORVASC) 5 MG tablet Take 2 tablets (10 mg total) by mouth daily. (Patient not taking: Reported on 01/30/2022) 30 tablet 0   OLANZapine zydis (ZYPREXA) 5 MG disintegrating tablet Take 1 tablet (5 mg total) by mouth 2 (two) times daily. (Patient not taking: Reported on 01/30/2022) 60 tablet 0   ondansetron (ZOFRAN) 4 MG tablet Take 1 tablet (4 mg total) by mouth every 6 (six) hours as needed for nausea. (Patient not taking: Reported on 01/30/2022) 20 tablet 0    Musculoskeletal: Strength & Muscle Tone: within normal limits Gait & Station: normal Patient leans: N/A   Psychiatric Specialty Exam: Presentation  General Appearance:  Appropriate for Environment  Eye Contact: Good  Speech: Clear and Coherent  Speech Volume: Normal  Handedness: Ambidextrous   Mood and Affect  Mood: Euphoric; Euthymic  Affect: Appropriate   Thought Process  Thought Processes: Irrevelant; Disorganized  Descriptions of Associations:Tangential  Orientation:Partial  Thought Content:Delusions; Tangential  History of Schizophrenia/Schizoaffective disorder:Yes  Duration of Psychotic Symptoms:Less than six months  Hallucinations:Hallucinations: None  Ideas of Reference:None  Suicidal Thoughts:Suicidal Thoughts: No  Homicidal  Thoughts:Homicidal Thoughts: No   Sensorium  Memory: Immediate Fair; Remote Fair  Judgment: Poor  Insight: Poor   Executive Functions  Concentration: Poor  Attention Span: Fair  Recall: [employer] of Knowledge: Fair  Language: Fair   Psychomotor Activity  Psychomotor Activity: Psychomotor Activity: Normal   Assets  Assets: Armed forces logistics/support/administrative officer; Housing; Social Support    Sleep  Sleep: Sleep: Fair   Physical Exam: Physical Exam HENT:     Mouth/Throat:     Mouth: Mucous membranes are moist.  Cardiovascular:     Rate and Rhythm: Normal rate.  Pulmonary:     Effort: Pulmonary effort is normal.  Neurological:     Mental Status: She is alert.  Psychiatric:        Attention and Perception: Attention normal.        Mood and Affect: Mood is elated.        Speech: Speech normal.        Behavior: Behavior is cooperative.        Thought Content: Thought content is paranoid and delusional.        Cognition and Memory: Cognition is impaired.        Judgment: Judgment is inappropriate.    Review of Systems  Constitutional: Negative.   HENT: Negative.    Musculoskeletal: Negative.   Psychiatric/Behavioral:         Patient is delusional and paranoid   Blood pressure (!) 152/113, pulse 94, temperature 98.4 F (36.9 C), temperature source Axillary, resp. rate 16, height 5' 7"$  (1.702 m), weight 105 kg, last menstrual period 02/16/2015, SpO2 99 %. Body mass index is 36.26 kg/m.  Medical Decision Making: Recommend inpatient psychiatric treatment.  Patient restarted on her home medications.  Will consult with social worker for bed placement.   Disposition: Recommend psychiatric Inpatient admission when medically cleared.  Keilin Gamboa MOTLEY-MANGRUM, PMHNP 10/23/2022 11:20 AM

## 2022-10-23 NOTE — Progress Notes (Signed)
LCSW Progress Note  [id number]   SAMIE FLEGEL  10/23/2022  3:45 PM  Description:   Inpatient Psychiatric Referral  Patient was recommended inpatient per [facility], PMHNP. There are no available beds at [facility]. Patient was referred to the following facilities:   Destination Service Provider Address Phone Fax  [facility]  [address], [place] [address] [phone]  [place]  [address], [place] [id number] [phone]  [facility]  [place], [place] [address] [phone] [place]  [address] [place] [address] [phone]  [facility]  [place], [place] [address] [place]  [facility]  [address] [place] [address] [phone] [place]  [place], [place] [address] [phone] [facility]  [id number] [place] [address] [phone] [facility]  [address] [place], [place] [address] [phone] [place] [address] [place] [address] [place]  [facility]  [address] [place] [address] [phone]  [facility]  [address] [place] [address] [phone]  [place] [address] [place] [address] [phone]  [facility]  [place] [address] [phone]  [facility]  [address] [place] [address] [phone] [facility]  [place] [address] [phone] [facility]  [address], [place] [address] [place]  [address] [place] [address] [phone]  [facility]  [address], [place] [address] [id number]  [facility] Office  [address], [place] [address] [phone]  [facility]  [place], [place] [address] [place]  [facility]  [address] [place] [address] [phone] [place]  [address], [place] [address] [phone] [place] [address] [place]  [facility]  [address], [place] [address] [phone]  [facility]  [address] [place] [address] [phone]  [facility]  [address], [place] [address] [phone]  [facility]  [place], [place] [address] [phone] [facility]  [address], [place] [address] [phone] [facility]  [place] [address] [phone]  [place], [place] [address] [phone] [facility]  [place], [place] [address] [id number]     Situation ongoing, CSW to continue following and update chart as more information becomes available.      Denna Haggard, Latanya Presser  10/23/2022 3:45 PM

## 2022-10-23 NOTE — ED Notes (Signed)
Pt bed linen/ scrubs changed d/t incontinence episode/ pt bathed self with soap  and water

## 2022-10-23 NOTE — ED Provider Notes (Signed)
Emergency Medicine Observation Re-evaluation Note  Sally Wang is a 62 y.o. female, seen on rounds today.  Pt initially presented to the ED for complaints of Paranoid (IVC/) Currently, the patient is awake, eating breakfast, rambling speech, refusing to answer orientation questions stating "it is in your file"..  Physical Exam  BP (!) 145/95 (BP Location: Right Arm)   Pulse 88   Temp 98.4 F (36.9 C) (Axillary)   Resp 15   Ht 5' 7"$  (1.702 m)   Wt 105 kg   LMP 02/16/2015   SpO2 99%   BMI 36.26 kg/m  Physical Exam General: No distress, tangential rambling speech Cardiac: Well-perfused Lungs: No increased work of breathing Psych: Tangential  ED Course / MDM  EKG:EKG Interpretation  Date/Time:  Thursday October 22 2022 21:21:19 EST Ventricular Rate:  109 PR Interval:  143 QRS Duration: 84 QT Interval:  354 QTC Calculation: 477 R Axis:   2 Text Interpretation: Sinus tachycardia Probable left atrial enlargement No significant change since last tracing Confirmed by Blanchie Dessert [phone]) on 10/22/2022 9:49:10 PM  I have reviewed the labs performed to date as well as medications administered while in observation.  Recent changes in the last 24 hours include refusing oral medications .  Plan  Current plan is for psychiatric evaluation and likely placement.    Ezequiel Essex, MD 10/23/22 [phone]

## 2022-10-24 DIAGNOSIS — F22 Delusional disorders: Secondary | ICD-10-CM

## 2022-10-24 LAB — SARS CORONAVIRUS 2 BY RT PCR: SARS Coronavirus 2 by RT PCR: NEGATIVE

## 2022-10-24 NOTE — ED Provider Notes (Signed)
Emergency Medicine Observation Re-evaluation Note  Sally Wang is a 62 y.o. female, seen on rounds today at 0700.  Pt initially presented to the ED for complaints of Paranoid (IVC/) Currently, the patient is resting comfortably.  Physical Exam  BP (!) 197/108 (BP Location: Left Arm)   Pulse 100   Temp 97.7 F (36.5 C) (Oral)   Resp 18   Ht 5' 7"$  (1.702 m)   Wt 105 kg   LMP 02/16/2015   SpO2 99%   BMI 36.26 kg/m  Physical Exam General: NAD   ED Course / MDM  EKG:EKG Interpretation  Date/Time:  Thursday October 22 2022 21:21:19 EST Ventricular Rate:  109 PR Interval:  143 QRS Duration: 84 QT Interval:  354 QTC Calculation: 477 R Axis:   2 Text Interpretation: Sinus tachycardia Probable left atrial enlargement No significant change since last tracing Confirmed by Blanchie Dessert [phone]) on 10/22/2022 9:49:10 PM  I have reviewed the labs performed to date as well as medications administered while in observation.  Recent changes in the last 24 hours include no acute events reported.  Plan  Current plan is for transfer to [facility], Dr. Abbey Chatters accepting.    Valarie Merino, MD 10/24/22 [phone]

## 2022-10-24 NOTE — ED Notes (Signed)
Report called to [place] at [facility]. Sheriff called, and voicemail left.

## 2022-10-24 NOTE — Progress Notes (Signed)
On 10/23/22 at 2341 this CSW spoke with [place] requesting that pt be reviewed. CSW sent updated notes.    Pt was accepted [facility] 10/24/22; Bed Assignment Janora [facility]  Pt meets inpatient criteria per Michaele Offer, [place]  Attending Physician will be Claudie Revering, MD  Report can be called to: [phone]  Pt can arrive after 8:00am  Care Team notified: Titus Dubin, RN  Nadara Mode, [place] 10/24/2022 @ 12:20 AM

## 2022-10-24 NOTE — ED Notes (Signed)
Attempted to call [facility]. No answer. Message left.

## 2022-10-24 NOTE — ED Notes (Signed)
Pt refusing vitals.

## 2022-10-24 NOTE — ED Notes (Signed)
Pt sitting on side of bed, I ask her to take vitals she refuse to take vitals, stated that she is refusing everything.

## 2022-10-24 NOTE — ED Notes (Signed)
Pt still refusing VS and all medications

## 2022-10-24 NOTE — ED Notes (Signed)
Pt took shower independently.

## 2022-10-24 NOTE — ED Notes (Signed)
Pt's husband called, and was informed of the plan. He was not happy with pt going to [facility], this nurse expressed to pt that we can not effectively treat pt here and [facility] has the appropriate facilities.

## 2022-10-24 NOTE — Progress Notes (Signed)
[facility] Psych ED Progress Note  10/24/2022 7:10 PM Sally Wang  MRN:  [id number]   Principal Problem: Delusional disorder ([place]) Diagnosis:  Principal Problem:   Delusional disorder ([place]) Active Problems:   Schizoaffective disorder [facility])   ED Assessment Time Calculation: Start Time: 1800 Stop Time: 1815 Total Time in Minutes (Assessment Completion): 15    Subjective:  Sally Wang, 62 y.o., female patient seen face to face by this provider, consulted with Dr. Dwyane Dee; and chart reviewed on 10/24/22.  On evaluation Sally Wang reports she is having a wonderful day, "God woke me up this morning, dont you feel good" Provider asked her how she slept and ate, she said "good" thank God for the food to eat, patient asked when she was going home, provider informed her she was going to another facility that will help with medication management and therapy. Patient stated "you all can not make me take all these medications, but I will play your game."  During evaluation Sally Wang is sitting on the edge of her bed in no acute distress. She is alert, oriented x 3, anxious at times, cooperative and attentive. Her mood is euthymic with congruent affect. She has normal speech, and behavior.  She denies suicidal/self-harm/homicidal ideation, psychosis, and paranoia.     Past Psychiatric History: Schizophrenia   [place] Scale:  [place] ED from 01/29/2022 in [facility] Emergency Department at [facility] Admission (Discharged) from 01/26/2020 in [facility]  C-SSRS RISK CATEGORY No Risk No Risk       Past Medical History:  Past Medical History:  Diagnosis Date   Hypertension    Schizoaffective disorder [facility])     Past Surgical History:  Procedure Laterality Date   CHOLECYSTECTOMY  123456   UMBILICAL HERNIA REPAIR N/A 02/29/2020   Procedure: HERNIA REPAIR UMBILICAL ADULT;  Surgeon: Coralie Keens, MD;  Location: [facility] ORS;  Service: General;   Laterality: N/A;   Family History:  Family History  Problem Relation Age of Onset   Coronary artery disease Father        died 78 yo   Diabetes Mother        died 48   Hypertension Brother     Social History:  Social History   Substance and Sexual Activity  Alcohol Use No     Social History   Substance and Sexual Activity  Drug Use No    Social History   Socioeconomic History   Marital status: Married    Spouse name: Hoy Morn   Number of children: Not on file   Years of education: Not on file   Highest education level: Not on file  Occupational History   Occupation: homemaker  Tobacco Use   Smoking status: Never   Smokeless tobacco: Never  Vaping Use   Vaping Use: Never used  Substance and Sexual Activity   Alcohol use: No   Drug use: No   Sexual activity: Not Currently  Other Topics Concern   Not on file  Social History Narrative   Husband - [place] -2010 [place]      Pets -  Pit bulls #4            Social Determinants of Health   Financial Resource Strain: Not on file  Food Insecurity: Not on file  Transportation Needs: Not on file  Physical Activity: Not on file  Stress: Not on file  Social Connections: Not on file    Sleep: Fair  Appetite:  Fair  Current Medications: Current Facility-Administered Medications  Medication Dose Route Frequency Provider Last Rate Last Admin   amLODipine (NORVASC) tablet 10 mg  10 mg Oral Daily Plunkett, Whitney, MD   10 mg at 10/23/22 0900   cloNIDine (CATAPRES) tablet 0.2 mg  0.2 mg Oral BID Rancour, Stephen, MD   0.2 mg at 10/23/22 2116   divalproex (DEPAKOTE ER) 24 hr tablet 250 mg  250 mg Oral BID Rancour, Annie Main, MD   250 mg at 10/23/22 2116   OLANZapine zydis (ZYPREXA) disintegrating tablet 5 mg  5 mg Oral Q8H PRN Blanchie Dessert, MD   5 mg at 10/23/22 [id number]   And   LORazepam (ATIVAN) tablet 1 mg  1 mg Oral PRN Blanchie Dessert, MD       OLANZapine zydis  (ZYPREXA) disintegrating tablet 5 mg  5 mg Oral BID Rancour, Stephen, MD   5 mg at 10/23/22 2115   risperiDONE (RISPERDAL) tablet 4 mg  4 mg Oral BID Rancour, Annie Main, MD   4 mg at 10/23/22 2116   Current Outpatient Medications  Medication Sig Dispense Refill   amLODipine (NORVASC) 5 MG tablet Take 2 tablets (10 mg total) by mouth daily. (Patient not taking: Reported on 10/23/2022) 30 tablet 0   cloNIDine (CATAPRES) 0.2 MG tablet Take 0.2 mg by mouth 2 (two) times daily.     divalproex (DEPAKOTE ER) 250 MG 24 hr tablet Take 250 mg by mouth 2 (two) times daily.     OLANZapine zydis (ZYPREXA) 5 MG disintegrating tablet Take 1 tablet (5 mg total) by mouth 2 (two) times daily. (Patient not taking: Reported on 01/30/2022) 60 tablet 0   risperidone (RISPERDAL) 4 MG tablet Take 4 mg by mouth 2 (two) times daily.      Lab Results:  Results for orders placed or performed during the hospital encounter of 10/22/22 (from the past 48 hour(s))  Urinalysis, Routine w reflex microscopic -Urine, Clean Catch     Status: Abnormal   Collection Time: 10/22/22  9:02 PM  Result Value Ref Range   Color, Urine STRAW (A) YELLOW   APPearance CLEAR CLEAR   Specific Gravity, Urine 1.018 1.005 - 1.030   pH 5.0 5.0 - 8.0   Glucose, UA NEGATIVE NEGATIVE mg/dL   Hgb urine dipstick SMALL (A) NEGATIVE   Bilirubin Urine NEGATIVE NEGATIVE   Ketones, ur NEGATIVE NEGATIVE mg/dL   Protein, ur NEGATIVE NEGATIVE mg/dL   Nitrite NEGATIVE NEGATIVE   Leukocytes,Ua NEGATIVE NEGATIVE   RBC / HPF 0-5 0 - 5 RBC/hpf   WBC, UA 0-5 0 - 5 WBC/hpf   Bacteria, UA RARE (A) NONE SEEN   Squamous Epithelial / HPF 0-5 0 - 5 /HPF    Comment: Performed at [facility], [place] [address] [place], [place] [address]  Rapid urine drug screen (hospital performed)     Status: None   Collection Time: 10/22/22  9:02 PM  Result Value Ref Range   Opiates NONE DETECTED NONE DETECTED   Cocaine NONE DETECTED NONE DETECTED    Benzodiazepines NONE DETECTED NONE DETECTED   Amphetamines NONE DETECTED NONE DETECTED   Tetrahydrocannabinol NONE DETECTED NONE DETECTED   Barbiturates NONE DETECTED NONE DETECTED    Comment: (NOTE) DRUG SCREEN FOR MEDICAL PURPOSES ONLY.  IF CONFIRMATION IS NEEDED FOR ANY PURPOSE, NOTIFY LAB WITHIN 5 DAYS.  LOWEST DETECTABLE LIMITS FOR URINE DRUG SCREEN Drug Class  Cutoff (ng/mL) Amphetamine and metabolites    1000 Barbiturate and metabolites    200 Benzodiazepine                 200 Opiates and metabolites        300 Cocaine and metabolites        300 THC                            50 Performed at [facility], [place] [address], [place], [place] [address]   CBC with Differential/Platelet     Status: None   Collection Time: 10/22/22  9:21 PM  Result Value Ref Range   WBC 5.2 4.0 - 10.5 K/uL   RBC 4.36 3.87 - 5.11 MIL/uL   Hemoglobin 12.8 12.0 - 15.0 g/dL   HCT 40.4 36.0 - 46.0 %   MCV 92.7 80.0 - 100.0 fL   MCH 29.4 26.0 - 34.0 pg   MCHC 31.7 30.0 - 36.0 g/dL   RDW 12.8 11.5 - 15.5 %   Platelets 261 150 - 400 K/uL   nRBC 0.0 0.0 - 0.2 %   Neutrophils Relative % 68 %   Neutro Abs 3.5 1.7 - 7.7 K/uL   Lymphocytes Relative 26 %   Lymphs Abs 1.3 0.7 - 4.0 K/uL   Monocytes Relative 5 %   Monocytes Absolute 0.2 0.1 - 1.0 K/uL   Eosinophils Relative 1 %   Eosinophils Absolute 0.0 0.0 - 0.5 K/uL   Basophils Relative 0 %   Basophils Absolute 0.0 0.0 - 0.1 K/uL   Immature Granulocytes 0 %   Abs Immature Granulocytes 0.02 0.00 - 0.07 K/uL    Comment: Performed at [facility], [place] [address] [place], [place] [address]  Comprehensive metabolic panel     Status: None   Collection Time: 10/22/22  9:21 PM  Result Value Ref Range   Sodium 137 135 - 145 mmol/L   Potassium 4.1 3.5 - 5.1 mmol/L   Chloride 101 98 - 111 mmol/L   CO2 26 22 - 32 mmol/L   Glucose, Bld 96 70 - 99 mg/dL    Comment: Glucose reference range applies  only to samples taken after fasting for at least 8 hours.   BUN 23 8 - 23 mg/dL   Creatinine, Ser 0.77 0.44 - 1.00 mg/dL   Calcium 9.5 8.9 - 10.3 mg/dL   Total Protein 7.5 6.5 - 8.1 g/dL   Albumin 3.7 3.5 - 5.0 g/dL   AST 19 15 - 41 U/L   ALT 19 0 - 44 U/L   Alkaline Phosphatase 68 38 - 126 U/L   Total Bilirubin 0.5 0.3 - 1.2 mg/dL   GFR, Estimated >60 >60 mL/min    Comment: (NOTE) Calculated using the CKD-EPI Creatinine Equation (2021)    Anion gap 10 5 - 15    Comment: Performed at [facility], [place] [address], [place], [place] [address]  Brain natriuretic peptide     Status: None   Collection Time: 10/22/22  9:21 PM  Result Value Ref Range   B Natriuretic Peptide 43.8 0.0 - 100.0 pg/mL    Comment: Performed at [facility], [place] [address], [place], [place] [address]  Ethanol     Status: None   Collection Time: 10/22/22  9:21 PM  Result Value Ref Range   Alcohol, Ethyl (B) <10 <10 mg/dL    Comment: (NOTE)  Lowest detectable limit for serum alcohol is 10 mg/dL.  For medical purposes only. Performed at [facility], [place] [address], [place], [place] [id number]   Salicylate level     Status: Abnormal   Collection Time: 10/22/22  9:21 PM  Result Value Ref Range   Salicylate Lvl [id number] (L) 7.0 - 30.0 mg/dL    Comment: Performed at [facility], [place] [address], [place], [place] [address]  Acetaminophen level     Status: Abnormal   Collection Time: 10/22/22  9:21 PM  Result Value Ref Range   Acetaminophen (Tylenol), Serum <10 (L) 10 - 30 ug/mL    Comment: (NOTE) Therapeutic concentrations vary significantly. A range of 10-30 ug/mL  may be an effective concentration for many patients. However, some  are best treated at concentrations outside of this range. Acetaminophen concentrations >150 ug/mL at 4 hours after ingestion  and >50 ug/mL at 12 hours after ingestion are often associated with  toxic  reactions.  Performed at [facility], [place] [address] [place], [place] [address]   SARS Coronavirus 2 by RT PCR (hospital order, performed in [facility] hospital lab) *cepheid single result test* Anterior Nasal Swab     Status: None   Collection Time: 10/24/22  9:33 AM   Specimen: Anterior Nasal Swab  Result Value Ref Range   SARS Coronavirus 2 by RT PCR NEGATIVE NEGATIVE    Comment: Performed at [facility], [place] [address], [place],  [address]    Blood Alcohol level:  Lab Results  Component Value Date   [facility] <10 10/22/2022   ETH <10 01/29/2022    Physical Findings:  CIWA:    COWS:     Musculoskeletal: Strength & Muscle Tone: within normal limits Gait & Station: normal Patient leans: N/A  Psychiatric Specialty Exam:  Presentation  General Appearance:  Appropriate for Environment  Eye Contact: Fleeting  Speech: Clear and Coherent  Speech Volume: Normal  Handedness: Ambidextrous   Mood and Affect  Mood: Anxious  Affect: Congruent   Thought Process  Thought Processes: Disorganized  Descriptions of Associations:Loose  Orientation:Partial  Thought Content:Illusions; Scattered  History of Schizophrenia/Schizoaffective disorder:Yes  Duration of Psychotic Symptoms:Less than six months  Hallucinations:Hallucinations: Auditory; Visual Description of Auditory Hallucinations: hears the voice of God, and others Description of Visual Hallucinations: angels and demons  Ideas of Reference:Paranoia  Suicidal Thoughts:Suicidal Thoughts: No  Homicidal Thoughts:Homicidal Thoughts: No   Sensorium  Memory: Remote Poor; Immediate Fair  Judgment: Poor  Insight: Fair   Materials engineer: Fair  Attention Span: Fair  Recall: [place] of Knowledge: Fair  Language: Good   Psychomotor Activity  Psychomotor Activity: Psychomotor Activity: Normal   Assets   Assets: Communication Skills; Social Support   Sleep  Sleep: Sleep: Fair    Physical Exam: Physical Exam Eyes:     Pupils: Pupils are equal, round, and reactive to light.  Cardiovascular:     Rate and Rhythm: Normal rate.  Pulmonary:     Effort: Pulmonary effort is normal.  Neurological:     Mental Status: She is alert.  Psychiatric:        Mood and Affect: Mood is anxious and elated.        Speech: Speech normal.        Behavior: Behavior is cooperative.        Thought Content: Thought content is delusional.        Cognition and Memory: Cognition  is impaired.        Judgment: Judgment is inappropriate.    Review of Systems  Constitutional: Negative.   HENT: Negative.    Respiratory: Negative.    Musculoskeletal: Negative.   Psychiatric/Behavioral:  Positive for hallucinations.    Blood pressure (!) 150/92, pulse 97, temperature 98.1 F (36.7 C), temperature source Oral, resp. rate 16, height 5' 7"$  (1.702 m), weight 105 kg, last menstrual period 02/16/2015, SpO2 98 %. Body mass index is 36.26 kg/m.   Medical Decision Making: Patient continues to require inpatient Psychiatric hospitalization.   Dewight Catino MOTLEY-MANGRUM, PMHNP 10/24/2022, 7:10 PM

## 2022-10-24 NOTE — ED Notes (Signed)
Pt has visitor

## 2022-10-24 NOTE — ED Notes (Signed)
Pt was agitated when asked for covid swab. Needed security and [employer] in rm, then pt allowed swab to be performed.

## 2022-10-25 DIAGNOSIS — R069 Unspecified abnormalities of breathing: Secondary | ICD-10-CM | POA: Diagnosis not present

## 2022-10-25 DIAGNOSIS — Z79899 Other long term (current) drug therapy: Secondary | ICD-10-CM | POA: Diagnosis not present

## 2022-10-25 DIAGNOSIS — F250 Schizoaffective disorder, bipolar type: Secondary | ICD-10-CM | POA: Diagnosis not present

## 2022-10-25 DIAGNOSIS — R0902 Hypoxemia: Secondary | ICD-10-CM | POA: Diagnosis not present

## 2022-10-25 DIAGNOSIS — Z1152 Encounter for screening for COVID-19: Secondary | ICD-10-CM | POA: Diagnosis not present

## 2022-10-25 DIAGNOSIS — R000 Tachycardia, unspecified: Secondary | ICD-10-CM | POA: Diagnosis not present

## 2022-10-25 DIAGNOSIS — R0602 Shortness of breath: Secondary | ICD-10-CM | POA: Diagnosis not present

## 2022-10-25 DIAGNOSIS — E119 Type 2 diabetes mellitus without complications: Secondary | ICD-10-CM | POA: Diagnosis not present

## 2022-10-25 DIAGNOSIS — Z7984 Long term (current) use of oral hypoglycemic drugs: Secondary | ICD-10-CM | POA: Diagnosis not present

## 2022-10-25 DIAGNOSIS — Z91148 Patient's other noncompliance with medication regimen for other reason: Secondary | ICD-10-CM | POA: Diagnosis not present

## 2022-10-25 DIAGNOSIS — I10 Essential (primary) hypertension: Secondary | ICD-10-CM | POA: Diagnosis not present

## 2022-10-25 DIAGNOSIS — R600 Localized edema: Secondary | ICD-10-CM | POA: Diagnosis not present

## 2022-10-25 DIAGNOSIS — F29 Unspecified psychosis not due to a substance or known physiological condition: Secondary | ICD-10-CM | POA: Diagnosis not present

## 2022-10-25 DIAGNOSIS — R062 Wheezing: Secondary | ICD-10-CM | POA: Diagnosis not present

## 2022-10-25 NOTE — ED Notes (Signed)
Officer Graves onsite to transport pt at this time. Pt does not have any belongings.

## 2022-10-25 NOTE — ED Notes (Signed)
Officer called at this time and stated transport will arrive din approx 30 mins to take pt to [facility].

## 2022-10-25 NOTE — ED Notes (Signed)
Pt escorted out of ED by [employer] in cuffs. Pt refused wheelchair and ambulated with steady gait

## 2022-10-25 NOTE — ED Provider Notes (Signed)
Emergency Medicine Observation Re-evaluation Note  Sally Wang is a 62 y.o. female, seen on rounds today.  Pt initially presented to the ED for complaints of Paranoid (IVC/) Currently, the patient is awake, alert -playing with his wash cloth.  Physical Exam  BP (!) 165/99   Pulse 90   Temp 98 F (36.7 C)   Resp 18   Ht 5' 7"$  (1.702 m)   Wt 105 kg   LMP 02/16/2015   SpO2 100%   BMI 36.26 kg/m  Physical Exam General: No distress Cardiac: Regular rate Lungs: No respiratory distress Psych: Calm  ED Course / MDM  EKG:EKG Interpretation  Date/Time:  Thursday October 22 2022 21:21:19 EST Ventricular Rate:  109 PR Interval:  143 QRS Duration: 84 QT Interval:  354 QTC Calculation: 477 R Axis:   2 Text Interpretation: Sinus tachycardia Probable left atrial enlargement No significant change since last tracing Confirmed by Blanchie Dessert [phone]) on 10/22/2022 9:49:10 PM  I have reviewed the labs performed to date as well as medications administered while in observation.  Recent changes in the last 24 hours include -no acute changes. Patient has been accepted to outside hospital for inpatient psychiatry care.  EMTALA has been completed.  Pending transport.  Plan  Current plan is for continued expectant management until patient is transferred.  2:24 PM Patient's transport team has arrived.  He looks well enough for transferred.     Varney Biles, MD 10/25/22 1425

## 2022-10-25 NOTE — ED Notes (Signed)
Patient refused VS. Will try again later.

## 2022-10-25 NOTE — ED Notes (Addendum)
Left message with [employer] [phone] requesting transport for pt to [facility].

## 2022-11-17 ENCOUNTER — Encounter (HOSPITAL_COMMUNITY): Payer: Self-pay

## 2022-11-17 ENCOUNTER — Emergency Department (HOSPITAL_COMMUNITY)
Admission: EM | Admit: 2022-11-17 | Discharge: 2022-11-17 | Disposition: A | Discharge status: Home / Self Care | Payer: Medicare HMO | Attending: Emergency Medicine | Admitting: Emergency Medicine

## 2022-11-17 ENCOUNTER — Emergency Department (HOSPITAL_COMMUNITY): Payer: Medicare HMO

## 2022-11-17 ENCOUNTER — Other Ambulatory Visit: Payer: Self-pay

## 2022-11-17 DIAGNOSIS — R0602 Shortness of breath: Secondary | ICD-10-CM

## 2022-11-17 DIAGNOSIS — R600 Localized edema: Secondary | ICD-10-CM

## 2022-11-17 DIAGNOSIS — R062 Wheezing: Secondary | ICD-10-CM | POA: Insufficient documentation

## 2022-11-17 DIAGNOSIS — R000 Tachycardia, unspecified: Secondary | ICD-10-CM | POA: Diagnosis not present

## 2022-11-17 DIAGNOSIS — Z1152 Encounter for screening for COVID-19: Secondary | ICD-10-CM | POA: Insufficient documentation

## 2022-11-17 DIAGNOSIS — R609 Edema, unspecified: Secondary | ICD-10-CM

## 2022-11-17 DIAGNOSIS — Z79899 Other long term (current) drug therapy: Secondary | ICD-10-CM | POA: Diagnosis not present

## 2022-11-17 LAB — COMPREHENSIVE METABOLIC PANEL
ALT: 42 U/L (ref 0–44)
AST: 31 U/L (ref 15–41)
Albumin: 3.5 g/dL (ref 3.5–5.0)
Alkaline Phosphatase: 74 U/L (ref 38–126)
Anion gap: 8 (ref 5–15)
BUN: 17 mg/dL (ref 8–23)
CO2: 31 mmol/L (ref 22–32)
Calcium: 8.4 mg/dL — ABNORMAL LOW (ref 8.9–10.3)
Chloride: 102 mmol/L (ref 98–111)
Creatinine, Ser: 0.8 mg/dL (ref 0.44–1.00)
GFR, Estimated: >60 mL/min (ref >60)
Glucose, Bld: 149 mg/dL — ABNORMAL HIGH (ref 70–99)
Potassium: 4.3 mmol/L (ref 3.5–5.1)
Sodium: 141 mmol/L (ref 135–145)
Total Bilirubin: 0.3 mg/dL (ref 0.3–1.2)
Total Protein: 8 g/dL (ref 6.5–8.1)

## 2022-11-17 LAB — RESP PANEL BY RT-PCR (RSV, FLU A&B, COVID)  RVPGX2
Influenza A by PCR: NEGATIVE
Influenza B by PCR: NEGATIVE
Resp Syncytial Virus by PCR: NEGATIVE
SARS Coronavirus 2 by RT PCR: NEGATIVE

## 2022-11-17 LAB — CBC
HCT: 39.1 % (ref 36.0–46.0)
Hemoglobin: 12.1 g/dL (ref 12.0–15.0)
MCH: 29.9 pg (ref 26.0–34.0)
MCHC: 30.9 g/dL (ref 30.0–36.0)
MCV: 96.5 fL (ref 80.0–100.0)
Platelets: 258 10*3/uL (ref 150–400)
RBC: 4.05 MIL/uL (ref 3.87–5.11)
RDW: 13.3 % (ref 11.5–15.5)
WBC: 10.4 10*3/uL (ref 4.0–10.5)
nRBC: 0 % (ref 0.0–0.2)

## 2022-11-17 LAB — BRAIN NATRIURETIC PEPTIDE: B Natriuretic Peptide: 74.4 pg/mL (ref 0.0–100.0)

## 2022-11-17 LAB — TROPONIN I (HIGH SENSITIVITY): Troponin I (High Sensitivity): 15 ng/L (ref <18)

## 2022-11-17 MED ORDER — FUROSEMIDE 10 MG/ML IJ SOLN
40.0000 mg | Freq: Once | INTRAMUSCULAR | Status: AC
  Administered 2022-11-17: 40 mg via INTRAVENOUS
  Filled 2022-11-17: qty 4

## 2022-11-17 MED ORDER — ALBUTEROL SULFATE HFA 108 (90 BASE) MCG/ACT IN AERS
2.0000 | INHALATION_SPRAY | Freq: Once | RESPIRATORY_TRACT | Status: AC
  Administered 2022-11-17: 2 via RESPIRATORY_TRACT
  Filled 2022-11-17: qty 6.7

## 2022-11-17 MED ORDER — MAGNESIUM SULFATE 2 GM/50ML IV SOLN
2.0000 g | Freq: Once | INTRAVENOUS | Status: AC
  Administered 2022-11-17: 2 g via INTRAVENOUS
  Filled 2022-11-17: qty 50

## 2022-11-17 MED ORDER — IPRATROPIUM-ALBUTEROL 0.5-2.5 (3) MG/3ML IN SOLN
3.0000 mL | Freq: Once | RESPIRATORY_TRACT | Status: AC
  Administered 2022-11-17: 3 mL via RESPIRATORY_TRACT
  Filled 2022-11-17: qty 3

## 2022-11-17 MED ORDER — FUROSEMIDE 20 MG PO TABS: 20.0000 mg | ORAL_TABLET | Freq: Every day | ORAL | 0 refills | Status: DC

## 2022-11-17 MED ORDER — METHYLPREDNISOLONE SODIUM SUCC 125 MG IJ SOLR
125.0000 mg | Freq: Once | INTRAMUSCULAR | Status: AC
  Administered 2022-11-17: 125 mg via INTRAVENOUS
  Filled 2022-11-17: qty 2

## 2022-11-17 NOTE — ED Triage Notes (Signed)
BIBA from home for shortness of breath. Pt spent 8 days in a [facility] facility in [place], was dropped off at home today- now has SHOB, cough. DuoNeb given by EMS. Hx of schizophrenia and bipolar 94%, SHOB with exertion

## 2022-11-17 NOTE — ED Provider Notes (Signed)
[facility] Provider Note   CSN: [id number] Arrival date & time: 11/17/22  1710     History  Chief Complaint  Patient presents with   Shortness of Breath    Sally Wang is a 62 y.o. female presenting with 24 hours worth of shortness of breath.  Denies a history of asthma, COPD, DVT/PE or other respiratory illnesses.  Says that she also has a cough that is occasionally productive of dark sputum.  Denies any leg swelling.  Does not smoke.  No chest pain or palpitations. Reports being discharged from a behavioral health facility after 8 days earlier today.   Shortness of Breath      Home Medications Prior to Admission medications   Medication Sig Start Date End Date Taking? Authorizing Provider  amLODipine (NORVASC) 5 MG tablet Take 2 tablets (10 mg total) by mouth daily. Patient not taking: Reported on 10/23/2022 03/04/20   Raiford Noble Latif, DO  cloNIDine (CATAPRES) 0.2 MG tablet Take 0.2 mg by mouth 2 (two) times daily.    [provider]  divalproex (DEPAKOTE ER) 250 MG 24 hr tablet Take 250 mg by mouth 2 (two) times daily.    [provider]  OLANZapine zydis (ZYPREXA) 5 MG disintegrating tablet Take 1 tablet (5 mg total) by mouth 2 (two) times daily. Patient not taking: Reported on 01/30/2022 03/04/20   Raiford Noble Latif, DO  risperidone (RISPERDAL) 4 MG tablet Take 4 mg by mouth 2 (two) times daily.    [provider]      Allergies    Patient has no known allergies.    Review of Systems   Review of Systems  Respiratory:  Positive for shortness of breath.     Physical Exam Updated Vital Signs BP (!) 180/98   Pulse 98   Temp 98.4 F (36.9 C)   Resp 15   LMP 02/16/2015   SpO2 96%  Physical Exam Vitals and nursing note reviewed.  Constitutional:      General: She is not in acute distress.    Appearance: Normal appearance. She is not ill-appearing or toxic-appearing.  HENT:     Head:  Normocephalic and atraumatic.     Mouth/Throat:     Mouth: Mucous membranes are moist.     Pharynx: Oropharynx is clear.  Eyes:     General: No scleral icterus.    Conjunctiva/sclera: Conjunctivae normal.  Cardiovascular:     Rate and Rhythm: Normal rate and regular rhythm.  Pulmonary:     Effort: Pulmonary effort is normal. No respiratory distress.     Breath sounds: Examination of the right-upper field reveals wheezing. Examination of the left-upper field reveals wheezing. Examination of the left-lower field reveals rales. Wheezing and rales present.  Chest:     Chest wall: No tenderness.  Musculoskeletal:     Right lower leg: No tenderness. Edema present.     Left lower leg: No tenderness. Edema present.     Comments: Bilateral peripheral edema to the level of the knees  Skin:    Findings: No rash.  Neurological:     Mental Status: She is alert.  Psychiatric:        Mood and Affect: Mood normal.     ED Results / Procedures / Treatments   Labs (all labs ordered are listed, but only abnormal results are displayed) Labs Reviewed  COMPREHENSIVE METABOLIC PANEL - Abnormal; Notable for the following components:  Result Value   Glucose, Bld 149 (*)    Calcium 8.4 (*)    All other components within normal limits  RESP PANEL BY RT-PCR (RSV, FLU A&B, COVID)  [id number]  CBC  BRAIN NATRIURETIC PEPTIDE  TROPONIN I (HIGH SENSITIVITY)  TROPONIN I (HIGH SENSITIVITY)    EKG EKG Interpretation  Date/Time:  Tuesday November 17 2022 17:22:30 EST Ventricular Rate:  105 PR Interval:  134 QRS Duration: 85 QT Interval:  345 QTC Calculation: 456 R Axis:   14 Text Interpretation: Sinus tachycardia Ventricular premature complex Borderline repolarization abnormality nonspecific t waves from prior 2/24 Confirmed by Aletta Edouard [phone]) on 11/17/2022 5:31:34 PM  Radiology DG Chest Port 1 View  Result Date: 11/17/2022 CLINICAL DATA:  Shortness of breath. EXAM: PORTABLE CHEST 1 VIEW  COMPARISON:  October 22, 2022. FINDINGS: Stable cardiomediastinal silhouette. Both lungs are clear. The visualized skeletal structures are unremarkable. IMPRESSION: No active disease. Electronically Signed   By: Marijo Conception M.D.   On: 11/17/2022 18:38    Procedures Procedures   Medications Ordered in ED Medications  ipratropium-albuterol (DUONEB) 0.5-2.5 (3) MG/3ML nebulizer solution 3 mL (has no administration in time range)    ED Course/ Medical Decision Making/ A&P Clinical Course as of 11/17/22 2051  Tue Nov 17, 2022  1850 Patient was reevaluated and said that she felt much better however she continues to wheeze.  Oxygen saturation remains above 92. [MR]  1953 Reevaluated.  Continues with a raspy voice with some expiratory wheezing [MR]    Clinical Course User Index [MR] Feliza Diven, Cecilio Asper, PA-C                             Medical Decision Making Amount and/or Complexity of Data Reviewed Labs: ordered. Radiology: ordered.  Risk Prescription drug management.   62 year old female presenting with shortness of breath. The emergent differential diagnosis for shortness of breath includes, but is not limited to, Pulmonary edema, bronchoconstriction, Pneumonia, Pulmonary embolism, Pneumotherax/ Hemothorax, Dysrythmia, ACS.   This is not an exhaustive differential.    Past Medical History / Co-morbidities / Social History: Obesity, recently discharged from mental health facility secondary to schizoaffective disorder with psychosis   Additional history: Per chart review patient has never been diagnosed with any respiratory problems.  No imaging in the system for any COPD or asthma exacerbations.  No prescribed medications for respiratory illnesses.   Physical Exam: Pertinent physical exam findings include Swelling to the bilateral lower extremities Wheezing in all lung fields, worse in the bilateral upper  Lab Tests: I ordered, and personally interpreted labs.  The  pertinent results include: No acute findings   Imaging Studies: I ordered and independently visualized and interpreted CXR and I agree with the radiologist that there are no acute findings such as PNA and pulmonary edema.   Cardiac Monitoring:  The patient was maintained on a cardiac monitor.  I viewed and interpreted the cardiac monitored which showed an underlying rhythm of: Sinus   Medications: Given 1 DuoNeb by EMS.  Continued to wheeze.  I then ordered a second DuoNeb and patient appeared and sounded much better.  Did continue to wheeze and occasionally became tachypneic while talking.  Given Lasix, Solu-Medrol and magnesium.on reevaluation patient looks much better.      MDM/Disposition: This is a 62 year old female who presented today with shortness of breath.  She tells me that it started this evening after she returned from a mental  health facility in [place].  Her daughter thinks this may have been going on for longer however the facility that she was at did not communicate the patient's status very well and apparently dropped her off outside of her home and did not wait for her to get inside.  Originally suspected some type of heart failure secondary to her shortness of breath and peripheral edema.  BNP negative.  She was given Lasix for fluid overload and treated as though she were having some flare of an obstructive airway disease.  On reevaluation patient looks much better.  She was able to ambulate with pulse ox staying above 93%.  She and her daughter are agreeable to discharge home at this time.  She will be started on Lasix and given inhaler to use outpatient as necessary  Final Clinical Impression(s) / ED Diagnoses Final diagnoses:  Shortness of breath  Peripheral edema    Rx / DC Orders ED Discharge Orders          Ordered    furosemide (LASIX) 20 MG tablet  Daily        11/17/22 1855           Results and diagnoses were explained to the patient. Return  precautions discussed in full. Patient had no additional questions and expressed complete understanding.   This chart was dictated using voice recognition software.  Despite best efforts to proofread,  errors can occur which can change the documentation meaning.    Darliss Ridgel 11/17/22 2055    Hayden Rasmussen, MD 11/18/22 [id number]

## 2022-11-17 NOTE — Discharge Instructions (Addendum)
You came to the emergency department today with shortness of breath.  Your x-ray was normal.  It was your lab work.  You did have swelling in your legs so I am starting you on a medication called Lasix to help take off the fluid.  Please follow-up with a primary care provider within the next week for reevaluation.  They may want to keep you on the Lasix or potentially change her medications.  You may use the inhaler as needed for shortness of breath at home.  You may do 2 puffs every 4 hours.  Please return to the emergency department with any worsening or recurring symptoms.

## 2022-11-17 NOTE — ED Notes (Signed)
Pt urinated on self a large amount. Linens and gown changed, purewick repositioned

## 2022-11-17 NOTE — ED Notes (Signed)
Dc instructions and scripts reviewed with pt. Pt to follow up with PCP regarding lasix. Pt wheeled out car and transported home by daughter

## 2022-12-17 DIAGNOSIS — F310 Bipolar disorder, current episode hypomanic: Secondary | ICD-10-CM | POA: Diagnosis not present

## 2023-01-18 DIAGNOSIS — I10 Essential (primary) hypertension: Secondary | ICD-10-CM | POA: Diagnosis not present

## 2023-01-18 DIAGNOSIS — Z6841 Body Mass Index (BMI) 40.0 and over, adult: Secondary | ICD-10-CM | POA: Diagnosis not present

## 2023-01-21 DIAGNOSIS — F250 Schizoaffective disorder, bipolar type: Secondary | ICD-10-CM | POA: Diagnosis not present

## 2023-01-27 ENCOUNTER — Emergency Department (HOSPITAL_COMMUNITY)
Admission: EM | Admit: 2023-01-27 | Discharge: 2023-01-29 | Disposition: A | Discharge status: Other Acute Inpatient Hospital | Payer: Medicare HMO | Attending: Emergency Medicine | Admitting: Emergency Medicine

## 2023-01-27 DIAGNOSIS — R259 Unspecified abnormal involuntary movements: Secondary | ICD-10-CM | POA: Diagnosis present

## 2023-01-27 DIAGNOSIS — Z79899 Other long term (current) drug therapy: Secondary | ICD-10-CM | POA: Insufficient documentation

## 2023-01-27 DIAGNOSIS — F29 Unspecified psychosis not due to a substance or known physiological condition: Secondary | ICD-10-CM | POA: Diagnosis not present

## 2023-01-27 DIAGNOSIS — F22 Delusional disorders: Secondary | ICD-10-CM | POA: Diagnosis not present

## 2023-01-27 DIAGNOSIS — F259 Schizoaffective disorder, unspecified: Secondary | ICD-10-CM | POA: Diagnosis not present

## 2023-01-27 DIAGNOSIS — R0689 Other abnormalities of breathing: Secondary | ICD-10-CM | POA: Diagnosis not present

## 2023-01-27 DIAGNOSIS — I10 Essential (primary) hypertension: Secondary | ICD-10-CM | POA: Insufficient documentation

## 2023-01-27 DIAGNOSIS — F201 Disorganized schizophrenia: Secondary | ICD-10-CM | POA: Diagnosis not present

## 2023-01-27 DIAGNOSIS — F250 Schizoaffective disorder, bipolar type: Secondary | ICD-10-CM | POA: Diagnosis not present

## 2023-01-27 DIAGNOSIS — R442 Other hallucinations: Secondary | ICD-10-CM | POA: Diagnosis not present

## 2023-01-27 MED ORDER — ZIPRASIDONE MESYLATE 20 MG IM SOLR
20.0000 mg | Freq: Once | INTRAMUSCULAR | Status: AC
  Administered 2023-01-28: 20 mg via INTRAMUSCULAR
  Filled 2023-01-27: qty 20

## 2023-01-27 MED ORDER — STERILE WATER FOR INJECTION IJ SOLN
INTRAMUSCULAR | Status: AC
  Administered 2023-01-28: 10 mL
  Filled 2023-01-27: qty 10

## 2023-01-27 MED ORDER — LORAZEPAM 2 MG/ML IJ SOLN
2.0000 mg | Freq: Once | INTRAMUSCULAR | Status: AC
  Administered 2023-01-28: 2 mg via INTRAMUSCULAR
  Filled 2023-01-27: qty 1

## 2023-01-27 NOTE — ED Notes (Signed)
Sally Wang is refusing all staff contact such as vital signs , labs, and to answer basic questions as to why she is here. Pt was BIB [facility] under IVC for bizzare bx rambling conversation as well as walking around nude. Pt IVC by SO.

## 2023-01-27 NOTE — ED Notes (Signed)
Pt refuses vitals  

## 2023-01-27 NOTE — ED Provider Notes (Signed)
[place] EMERGENCY DEPARTMENT AT [facility] Provider Note   CSN: [id number] Arrival date & time: 01/27/23  2222     History {Add pertinent medical, surgical, social history, OB history to HPI:1} Chief Complaint  Patient presents with   Psychiatric Evaluation    Sally Wang is a 62 y.o. female.  Level 5 caveat for psychiatric illness.  Patient with history of schizoaffective disorder and hypertension brought in under involuntary commitment by her family members.  Patient not forthcoming with any information would not answer any questions about when she is here.  She has rambling speech and says she does not want to answer any questions does not want to be evaluated or touched by staff.  Per daughter at bedside as well as IVC paperwork patient has a history of schizoaffective disorder and is prescribed Depakote as well as Risperdal.  Daughter states has been taking her medications but believes she could be spitting them out.  Patient had bizarre behavior at home, "leaving the house and walk around all night without clothing, making verbal threats, leaving the stove on, cooking sticks and weeds, having threatening behavior at home".  Patient was recently treated at [facility] in February.  Family states this is an ongoing issue with her behavior and question medication compliance.   The history is provided by the patient. The history is limited by the condition of the patient.       Home Medications Prior to Admission medications   Medication Sig Start Date End Date Taking? Authorizing Provider  hydrochlorothiazide (HYDRODIURIL) 25 MG tablet Take 25 mg by mouth daily. 11/18/22  Yes [provider]  metFORMIN (GLUCOPHAGE) 500 MG tablet SMARTSIG:1 Tablet(s) By Mouth Morning-Evening 11/18/22  Yes [provider]  traZODone (DESYREL) 50 MG tablet Take 50 mg by mouth at bedtime. 01/27/23  Yes [provider]  amLODipine (NORVASC) 5 MG tablet Take 2  tablets (10 mg total) by mouth daily. Patient not taking: Reported on 10/23/2022 03/04/20   Marguerita Merles Latif, DO  cloNIDine (CATAPRES) 0.2 MG tablet Take 0.2 mg by mouth 2 (two) times daily.    [provider]  divalproex (DEPAKOTE ER) 250 MG 24 hr tablet Take 250 mg by mouth 2 (two) times daily.    [provider]  furosemide (LASIX) 20 MG tablet Take 1 tablet (20 mg total) by mouth daily. 11/17/22 12/17/22  Redwine, Madison A, PA-C  OLANZapine zydis (ZYPREXA) 5 MG disintegrating tablet Take 1 tablet (5 mg total) by mouth 2 (two) times daily. Patient not taking: Reported on 01/30/2022 03/04/20   Marguerita Merles Latif, DO  risperidone (RISPERDAL) 4 MG tablet Take 4 mg by mouth 2 (two) times daily.    [provider]      Allergies    Patient has no known allergies.    Review of Systems   Review of Systems  Unable to perform ROS: Psychiatric disorder    Physical Exam Updated Vital Signs LMP 02/16/2015  Physical Exam Vitals and nursing note reviewed.  Constitutional:      General: She is not in acute distress.    Appearance: She is well-developed.     Comments: No distress, speaking in full sentences, oriented to person only.  HENT:     Head: Normocephalic and atraumatic.  Neck:     Comments: No meningismus. Cardiovascular:     Comments: Refuses auscultation of heart sounds Pulmonary:     Comments: No respiratory distress, refuses auscultation of lung sounds Abdominal:  Comments: Refuses abdominal exam  Skin:    General: Skin is warm.  Neurological:     Mental Status: She is alert.     Motor: No abnormal muscle tone.     Comments: Cannot cooperate for formal neurological testing.  No obvious facial droop.  Moving extremity spontaneously, would not follow commands.     ED Results / Procedures / Treatments   Labs (all labs ordered are listed, but only abnormal results are displayed) Labs Reviewed  CBC WITH DIFFERENTIAL/PLATELET  COMPREHENSIVE  METABOLIC PANEL  ETHANOL  RAPID URINE DRUG SCREEN, HOSP PERFORMED  URINALYSIS, ROUTINE W REFLEX MICROSCOPIC  VALPROIC ACID LEVEL  BRAIN NATRIURETIC PEPTIDE    EKG None  Radiology No results found.  Procedures Procedures  {Document cardiac monitor, telemetry assessment procedure when appropriate:1}  Medications Ordered in ED Medications  ziprasidone (GEODON) injection 20 mg (has no administration in time range)  LORazepam (ATIVAN) injection 2 mg (has no administration in time range)    ED Course/ Medical Decision Making/ A&P   {   Click here for ABCD2, HEART and other calculatorsREFRESH Note before signing :1}                          Medical Decision Making Amount and/or Complexity of Data Reviewed Independent Historian: caregiver Labs: ordered. Decision-making details documented in ED Course. Radiology: ordered and independent interpretation performed. Decision-making details documented in ED Course. ECG/medicine tests: ordered and independent interpretation performed. Decision-making details documented in ED Course.  Risk Prescription drug management.   Patient admitted by family for bizarre behavior and nonsensical speech.  She refuses vital signs and refuses formal exam.  Involuntary commitment paperwork filed by family.  Will medicate to expedite workup to obtain blood work and urinalysis and vital signs.  {Document critical care time when appropriate:1} {Document review of labs and clinical decision tools ie heart score, Chads2Vasc2 etc:1}  {Document your independent review of radiology images, and any outside records:1} {Document your discussion with family members, caretakers, and with consultants:1} {Document social determinants of health affecting pt's care:1} {Document your decision making why or why not admission, treatments were needed:1} Final Clinical Impression(s) / ED Diagnoses Final diagnoses:  None    Rx / DC Orders ED Discharge Orders      None

## 2023-01-27 NOTE — ED Triage Notes (Addendum)
Pt from home BIB EMS and [facility] with IVC papers for delusional bx   IVC by husband for delusional behavior walking around nude with rambling conversation.

## 2023-01-28 ENCOUNTER — Other Ambulatory Visit: Payer: Self-pay

## 2023-01-28 ENCOUNTER — Encounter (HOSPITAL_COMMUNITY): Payer: Self-pay

## 2023-01-28 DIAGNOSIS — F250 Schizoaffective disorder, bipolar type: Secondary | ICD-10-CM | POA: Diagnosis not present

## 2023-01-28 DIAGNOSIS — F22 Delusional disorders: Secondary | ICD-10-CM | POA: Diagnosis not present

## 2023-01-28 DIAGNOSIS — I10 Essential (primary) hypertension: Secondary | ICD-10-CM | POA: Diagnosis not present

## 2023-01-28 DIAGNOSIS — F259 Schizoaffective disorder, unspecified: Secondary | ICD-10-CM | POA: Diagnosis not present

## 2023-01-28 LAB — CBC WITH DIFFERENTIAL/PLATELET
Abs Immature Granulocytes: 0 10*3/uL (ref 0.00–0.07)
Basophils Absolute: 0 10*3/uL (ref 0.0–0.1)
Basophils Relative: 1 %
Eosinophils Absolute: 0.1 10*3/uL (ref 0.0–0.5)
Eosinophils Relative: 2 %
HCT: 40.9 % (ref 36.0–46.0)
Hemoglobin: 12.9 g/dL (ref 12.0–15.0)
Immature Granulocytes: 0 %
Lymphocytes Relative: 27 %
Lymphs Abs: 1.1 10*3/uL (ref 0.7–4.0)
MCH: 29.4 pg (ref 26.0–34.0)
MCHC: 31.5 g/dL (ref 30.0–36.0)
MCV: 93.2 fL (ref 80.0–100.0)
Monocytes Absolute: 0.3 10*3/uL (ref 0.1–1.0)
Monocytes Relative: 7 %
Neutro Abs: 2.7 10*3/uL (ref 1.7–7.7)
Neutrophils Relative %: 63 %
Platelets: 231 10*3/uL (ref 150–400)
RBC: 4.39 MIL/uL (ref 3.87–5.11)
RDW: 12.7 % (ref 11.5–15.5)
WBC: 4.2 10*3/uL (ref 4.0–10.5)
nRBC: 0 % (ref 0.0–0.2)

## 2023-01-28 LAB — COMPREHENSIVE METABOLIC PANEL
ALT: 13 U/L (ref 0–44)
AST: 15 U/L (ref 15–41)
Albumin: 3.5 g/dL (ref 3.5–5.0)
Alkaline Phosphatase: 63 U/L (ref 38–126)
Anion gap: 7 (ref 5–15)
BUN: 15 mg/dL (ref 8–23)
CO2: 25 mmol/L (ref 22–32)
Calcium: 8.6 mg/dL — ABNORMAL LOW (ref 8.9–10.3)
Chloride: 100 mmol/L (ref 98–111)
Creatinine, Ser: 0.7 mg/dL (ref 0.44–1.00)
GFR, Estimated: >60 mL/min (ref >60)
Glucose, Bld: 120 mg/dL — ABNORMAL HIGH (ref 70–99)
Potassium: 3.3 mmol/L — ABNORMAL LOW (ref 3.5–5.1)
Sodium: 132 mmol/L — ABNORMAL LOW (ref 135–145)
Total Bilirubin: 0.5 mg/dL (ref 0.3–1.2)
Total Protein: 7.7 g/dL (ref 6.5–8.1)

## 2023-01-28 LAB — BRAIN NATRIURETIC PEPTIDE: B Natriuretic Peptide: 65.9 pg/mL (ref 0.0–100.0)

## 2023-01-28 LAB — ETHANOL: Alcohol, Ethyl (B): <10 mg/dL (ref <10)

## 2023-01-28 LAB — VALPROIC ACID LEVEL: Valproic Acid Lvl: <10 ug/mL — ABNORMAL LOW (ref 50.0–100.0)

## 2023-01-28 MED ORDER — DIVALPROEX SODIUM ER 500 MG PO TB24
1000.0000 mg | ORAL_TABLET | Freq: Every day | ORAL | Status: DC
  Administered 2023-01-29: 1000 mg via ORAL
  Filled 2023-01-28: qty 2

## 2023-01-28 MED ORDER — RISPERIDONE 2 MG PO TABS
2.0000 mg | ORAL_TABLET | Freq: Two times a day (BID) | ORAL | Status: DC
  Administered 2023-01-29: 2 mg via ORAL
  Filled 2023-01-28 (×2): qty 1

## 2023-01-28 MED ORDER — OLANZAPINE 5 MG PO TABS: 15.0000 mg | ORAL_TABLET | Freq: Two times a day (BID) | ORAL | Status: DC

## 2023-01-28 MED ORDER — AMLODIPINE BESYLATE 5 MG PO TABS
10.0000 mg | ORAL_TABLET | Freq: Every day | ORAL | Status: DC
  Administered 2023-01-28: 10 mg via ORAL
  Filled 2023-01-28 (×3): qty 2

## 2023-01-28 MED ORDER — OLANZAPINE 5 MG PO TBDP
5.0000 mg | ORAL_TABLET | Freq: Two times a day (BID) | ORAL | Status: DC
  Filled 2023-01-28: qty 1

## 2023-01-28 MED ORDER — DIVALPROEX SODIUM ER 250 MG PO TB24
250.0000 mg | ORAL_TABLET | Freq: Two times a day (BID) | ORAL | Status: DC
  Filled 2023-01-28: qty 1

## 2023-01-28 MED ORDER — OLANZAPINE 10 MG PO TBDP
10.0000 mg | ORAL_TABLET | Freq: Two times a day (BID) | ORAL | Status: DC
  Administered 2023-01-28 (×2): 10 mg via ORAL
  Filled 2023-01-28 (×2): qty 1

## 2023-01-28 MED ORDER — CLONIDINE HCL 0.1 MG PO TABS: 0.2000 mg | ORAL_TABLET | Freq: Two times a day (BID) | ORAL | Status: DC

## 2023-01-28 MED ORDER — HYDROCHLOROTHIAZIDE 12.5 MG PO TABS
25.0000 mg | ORAL_TABLET | Freq: Every day | ORAL | Status: DC
  Administered 2023-01-28: 25 mg via ORAL
  Filled 2023-01-28 (×3): qty 2

## 2023-01-28 MED ORDER — METFORMIN HCL 500 MG PO TABS: 500.0000 mg | ORAL_TABLET | Freq: Every day | ORAL | Status: DC

## 2023-01-28 MED ORDER — RISPERIDONE 2 MG PO TABS
4.0000 mg | ORAL_TABLET | Freq: Two times a day (BID) | ORAL | Status: DC
  Filled 2023-01-28: qty 2

## 2023-01-28 NOTE — BH Assessment (Signed)
Per [facility], no appropriate bed for patient. Patient to be referred out. CSW to assist with disposition.  Manfred Arch, MSW, LCSW Triage Specialist [phone]

## 2023-01-28 NOTE — ED Notes (Signed)
Patient still refusing all medication. Provider notified

## 2023-01-28 NOTE — Progress Notes (Signed)
[facility] Psych ED Progress Note  01/28/2023 9:14 AM Sally Wang  MRN:  [id number]   Subjective:  "I told my brother to get out of my house" Principal Problem: Schizoaffective disorder (HCC) Diagnosis:  Principal Problem:   Schizoaffective disorder (HCC) Active Problems:   Delusional disorder [facility])   ED Assessment Time Calculation: Start Time: 0800 Stop Time: 0830 Total Time in Minutes (Assessment Completion): 30  Sally Wang , 62 y.o., female patient seen face to face by this provider, consulted with Dr. Lucianne Muss; and chart reviewed on 01/28/23.   Sally Wang 62 y.o., female patient presented to [facility], accompanied by law enforcement, under IVC petition, petitioned by her spouse, Sally Wang. See IVC document under media. Petitioner indicated that patient had been not sleeping during the night and walking around outside nude, making verbal threats, leaving the stove on and cooking sticks and weeds.  Sally Wang was initially evaluated by [facility] provider yesterday and was found to be disorganized, paranoid, and delusional. Provider recommended inpatient psychiatric treatment due to patient was actively psychotic and disorganized. On re-evaluation today, Sally Wang, remains disorganized, and reports to this Clinical research associate that "my brother has me sent here." Sally Wang reports that she is has been trying to put her brother out of her home and he is trying to have her committed in retaliation. In review of chart Sally Wang is actually her husband not her brother. Patient goes on to state , she doesn't have any mental health conditions and she does not take "crazy meds, because I'm not crazy". Patient becomes very defensive when asked if she is hearing voices or seeing things, and responds, "I'm not crazy". During evaluation, Sally Wang is sitting on the side of bed rocking back and forth.  She also denies suicidal and homicidal ideations. She is appears to be responding to internal stimuli at times during evaluation.  Sally Wang also appears paranoid and manic as he speaking in a pressured and rapid manner. Although she denies SI, HI, given patient current mental status, she is unable to reliable contract for safety, and meets criteria for inpatient psychiatric treatment.     Past Psychiatric History: Schizoaffective Disorder and Delusional Disorder   [place] Scale:  Flowsheet Row ED from 01/27/2023 in [facility] Emergency Department at [facility] ED from 11/17/2022 in [facility] Emergency Department at [facility] ED from 01/29/2022 in [facility] Emergency Department at [facility]  C-SSRS RISK CATEGORY No Risk No Risk No Risk       Past Medical History:  Past Medical History:  Diagnosis Date   Hypertension    Schizoaffective disorder [facility])     Past Surgical History:  Procedure Laterality Date   CHOLECYSTECTOMY  2006   UMBILICAL HERNIA REPAIR N/A 02/29/2020   Procedure: HERNIA REPAIR UMBILICAL ADULT;  Surgeon: Abigail Miyamoto, MD;  Location: [facility] ORS;  Service: General;  Laterality: N/A;   Family History:  Family History  Problem Relation Age of Onset   Coronary artery disease Father        died 57 yo   Diabetes Mother        died 4   Hypertension Brother    Social History:  Social History   Substance and Sexual Activity  Alcohol Use No     Social History   Substance and Sexual Activity  Drug Use No    Social History   Socioeconomic History   Marital status: Married    Spouse name:  Sally Wang   Number of children: Not on file   Years of education: Not on file   Highest education level: Not on file  Occupational History   Occupation: homemaker  Tobacco Use   Smoking status: Never   Smokeless tobacco: Never  Vaping Use   Vaping Use: Never used  Substance and Sexual Activity   Alcohol use: No   Drug use: No   Sexual activity: Not Currently  Other Topics Concern   Not on file  Social History Narrative   Husband - Sally Wang 289   QMVHQI Sims -1987    [name] -2010 granddaughte      Pets -  Pit bulls #4            Social Determinants of Health   Financial Resource Strain: Not on file  Food Insecurity: Not on file  Transportation Needs: Not on file  Physical Activity: Not on file  Stress: Not on file  Social Connections: Not on file    Sleep: Poor  Appetite:  NA  Current Medications: Current Facility-Administered Medications  Medication Dose Route Frequency Provider Last Rate Last Admin   amLODipine (NORVASC) tablet 10 mg  10 mg Oral Daily Rancour, Stephen, MD       divalproex (DEPAKOTE ER) 24 hr tablet 250 mg  250 mg Oral BID Rancour, Stephen, MD       hydrochlorothiazide (HYDRODIURIL) tablet 25 mg  25 mg Oral Daily Rancour, Stephen, MD       OLANZapine zydis (ZYPREXA) disintegrating tablet 5 mg  5 mg Oral BID Rancour, Stephen, MD       risperiDONE (RISPERDAL) tablet 4 mg  4 mg Oral BID Rancour, Stephen, MD       Current Outpatient Medications  Medication Sig Dispense Refill   hydrochlorothiazide (HYDRODIURIL) 25 MG tablet Take 25 mg by mouth daily.     metFORMIN (GLUCOPHAGE) 500 MG tablet SMARTSIG:1 Tablet(s) By Mouth Morning-Evening     traZODone (DESYREL) 50 MG tablet Take 50 mg by mouth at bedtime.     amLODipine (NORVASC) 5 MG tablet Take 2 tablets (10 mg total) by mouth daily. (Patient not taking: Reported on 10/23/2022) 30 tablet 0   cloNIDine (CATAPRES) 0.2 MG tablet Take 0.2 mg by mouth 2 (two) times daily.     divalproex (DEPAKOTE ER) 250 MG 24 hr tablet Take 250 mg by mouth 2 (two) times daily.     furosemide (LASIX) 20 MG tablet Take 1 tablet (20 mg total) by mouth daily. 30 tablet 0   OLANZapine zydis (ZYPREXA) 5 MG disintegrating tablet Take 1 tablet (5 mg total) by mouth 2 (two) times daily. (Patient not taking: Reported on 01/30/2022) 60 tablet 0   risperidone (RISPERDAL) 4 MG tablet Take 4 mg by mouth 2 (two) times daily.      Lab Results:  Results for orders placed or performed during the hospital  encounter of 01/27/23 (from the past 48 hour(s))  CBC with Differential     Status: None   Collection Time: 01/28/23  2:27 AM  Result Value Ref Range   WBC 4.2 4.0 - 10.5 K/uL   RBC 4.39 3.87 - 5.11 MIL/uL   Hemoglobin 12.9 12.0 - 15.0 g/dL   HCT 69.6 29.5 - 28.4 %   MCV 93.2 80.0 - 100.0 fL   MCH 29.4 26.0 - 34.0 pg   MCHC 31.5 30.0 - 36.0 g/dL   RDW 13.2 44.0 - 10.2 %   Platelets 231 150 - 400 K/uL  nRBC 0.0 0.0 - 0.2 %   Neutrophils Relative % 63 %   Neutro Abs 2.7 1.7 - 7.7 K/uL   Lymphocytes Relative 27 %   Lymphs Abs 1.1 0.7 - 4.0 K/uL   Monocytes Relative 7 %   Monocytes Absolute 0.3 0.1 - 1.0 K/uL   Eosinophils Relative 2 %   Eosinophils Absolute 0.1 0.0 - 0.5 K/uL   Basophils Relative 1 %   Basophils Absolute 0.0 0.0 - 0.1 K/uL   Immature Granulocytes 0 %   Abs Immature Granulocytes 0.00 0.00 - 0.07 K/uL    Comment: Performed at [facility], [address] [place], [place] [address]  Comprehensive metabolic panel     Status: Abnormal   Collection Time: 01/28/23  2:27 AM  Result Value Ref Range   Sodium 132 (L) 135 - 145 mmol/L   Potassium 3.3 (L) 3.5 - 5.1 mmol/L   Chloride 100 98 - 111 mmol/L   CO2 25 22 - 32 mmol/L   Glucose, Bld 120 (H) 70 - 99 mg/dL    Comment: Glucose reference range applies only to samples taken after fasting for at least 8 hours.   BUN 15 8 - 23 mg/dL   Creatinine, Ser 6.04 0.44 - 1.00 mg/dL   Calcium 8.6 (L) 8.9 - 10.3 mg/dL   Total Protein 7.7 6.5 - 8.1 g/dL   Albumin 3.5 3.5 - 5.0 g/dL   AST 15 15 - 41 U/L   ALT 13 0 - 44 U/L   Alkaline Phosphatase 63 38 - 126 U/L   Total Bilirubin 0.5 0.3 - 1.2 mg/dL   GFR, Estimated >54 >09 mL/min    Comment: (NOTE) Calculated using the CKD-EPI Creatinine Equation (2021)    Anion gap 7 5 - 15    Comment: Performed at [facility], [address], [place], [place] [address]  Ethanol     Status: None   Collection Time: 01/28/23  2:27 AM  Result Value Ref  Range   Alcohol, Ethyl (B) <10 <10 mg/dL    Comment: (NOTE) Lowest detectable limit for serum alcohol is 10 mg/dL.  For medical purposes only. Performed at [facility], [address], [place], [place] [address]   Valproic acid level     Status: Abnormal   Collection Time: 01/28/23  2:27 AM  Result Value Ref Range   Valproic Acid Lvl <10 (L) 50.0 - 100.0 ug/mL    Comment: RESULT CONFIRMED BY MANUAL DILUTION Performed at [facility], [address], [place], [place] [address]   Brain natriuretic peptide     Status: None   Collection Time: 01/28/23  2:27 AM  Result Value Ref Range   B Natriuretic Peptide 65.9 0.0 - 100.0 pg/mL    Comment: Performed at [facility], [address] [place], [place] [address]    Blood Alcohol level:  Lab Results  Component Value Date   [facility] <10 01/28/2023   ETH <10 10/22/2022    Physical Findings:   Psychiatric Specialty Exam:  Presentation  General Appearance:  Fairly Groomed  Eye Contact: Fleeting  Speech: Pressured  Speech Volume: Increased  Handedness: Ambidextrous   Mood and Affect  Mood: Labile  Affect: Labile; Blunt   Thought Process  Thought Processes: Disorganized  Descriptions of Associations:Loose  Orientation:Partial (Patient is disoriented to chronic mental health condition)  Thought Content:Paranoid Ideation; Scattered; Illusions  History of Schizophrenia/Schizoaffective disorder:Yes  Duration of Psychotic Symptoms:Greater than six  months  Hallucinations:Hallucinations: None  Ideas of Reference:Paranoia; Percusatory  Suicidal Thoughts:Suicidal Thoughts: No  Homicidal Thoughts:Homicidal Thoughts: No   Sensorium  Memory: Remote Poor; Immediate Poor  Judgment: Poor  Insight: Poor; Lacking   Executive Functions  Concentration: Poor  Attention Span: Poor  Recall: Poor  Fund of  Knowledge: Fair  Language: Good   Psychomotor Activity  Psychomotor Activity: Psychomotor Activity: Restlessness (rocking back and forth continuously during interview)   Assets  Assets: Communication Skills; Social Support   Sleep  Sleep: Sleep: Poor (sleeps a few hrs during day up all night most nights)    Physical Exam: Physical Exam Constitutional:      Appearance: She is obese.  HENT:     Head: Normocephalic and atraumatic.  Eyes:     Extraocular Movements: Extraocular movements intact.     Pupils: Pupils are equal, round, and reactive to light.  Cardiovascular:     Rate and Rhythm: Normal rate and regular rhythm.  Pulmonary:     Effort: Pulmonary effort is normal.     Breath sounds: Normal breath sounds.  Musculoskeletal:     Cervical back: Normal range of motion.  Neurological:     General: No focal deficit present.     Mental Status: She is alert.    Review of Systems  Psychiatric/Behavioral:  The patient has insomnia.    Blood pressure (!) 161/98, pulse 75, temperature 97.6 F (36.4 C), temperature source Oral, resp. rate 15, last menstrual period 02/16/2015, SpO2 99 %. There is no height or weight on file to calculate BMI.   Medical Decision Making: Patient case review and discussed with Dr. Lucianne Muss. Patient meets criteria for inpatient psychiatric treatment. Patient is unable to reliably contract for safety at this time. [facility] is currently reviewing patient for admission, if no appropriate bed availability, LCSW notified and will be faxing patient out. EDP, RN, LCSW, notified of disposition.   Problem 1: Schizoaffective Disorder  Problem 2: Delusional Disorder  Continue home medications: -Zyprexa 5 mg BID -Risperidone 4 mg BID -Depakote 250 mg BID , valproic acid level <10 subtherapeutic     Joaquin Courts, FNP-C, [facility]  Behavioral Health Service Line  [phone] 01/28/2023, 9:14 AM

## 2023-01-28 NOTE — ED Notes (Signed)
Spoke with Joaquin Courts, NP instructed to try to give patient BP medication and zyprexa

## 2023-01-28 NOTE — ED Notes (Signed)
Patient refused all medication. Dr. Rhunette Croft made aware. Provider attempted to educate patient regarding the need for medication. Patient still refused to take any of the medications

## 2023-01-28 NOTE — ED Notes (Signed)
Provider at bedside

## 2023-01-28 NOTE — ED Notes (Signed)
Patient had a visit from her son. After he left patient is in the room having a full conversation with her self.

## 2023-01-28 NOTE — BH Assessment (Signed)
Comprehensive Clinical Assessment (CCA) Note  01/28/2023 Sally Wang [id number]  Disposition: Clinical report given to Sindy Guadeloupe, NP who recommends inpatient psychiatric admission. RN Reita Cliche and Dr. Glynn Octave notified of the recommendation.  The patient demonstrates the following risk factors for suicide: Chronic risk factors for suicide include: psychiatric disorder of schizoaffective disorder . Acute risk factors for suicide include: N/A. Protective factors for this patient include:  unable to assess . Considering these factors, the overall suicide risk at this point appears to be none. Patient is not appropriate for outpatient follow up.  Sally Wang is a 62 year old married female who presents involuntarily to [facility] ED via EMS. Per IVC by patients' spouse Kadidja Timon [phone]), "Respondent has been diagnosed schizophrenia and bipolar; respondent has been prescribed Haldol; respondent was previously [facility]; respondent is leaving the house and walking around all night without clothing; respondent is making verbal threats; respondent is leaving the stove on; respondent is cooking sticks and weeds; respondent has welling in both legs; respondent is danger to herself".   When asked what bring her to the ED, patient states "I don't know. Can you tell me why I'm here". Patient is a poor historian throughout the assessment and only answers some basic questions before stating she needs to talk later. Patient denies current SI, HI, auditory or visual hallucinations. Patient denies substance use. When asked if she takes medications, patient states "no, I'm not a sick person". Patient denies receiving any mental health services. Patient reports good sleep and appetite. Patient states she has good support from her family. Patient declines to answer any additional questions.  Clinician attempted to contact petitioner and patient's spouse Chanci Cope, however,  was unsuccessful.     Chief Complaint:  Chief Complaint  Patient presents with   Psychiatric Evaluation   Visit Diagnosis: Schizoaffective disorder    CCA Screening, Triage and Referral (STR)  Patient Reported Information How did you hear about [place]? Legal System  What Is the Reason for Your Visit/Call Today? Sally Wang is a 62 year old married female who presents involuntarily to [facility] ED via EMS. Per IVC by patients' spouse Aydrian Boullion [phone]), "Respondent has been diagnosed schizophrenia and bipolar; respondent has been prescribed Haldol; respondent was previously [facility]; respondent is leaving the house and walking around all night without clothing; respondent is making verbal threats; respondent is leaving the stove on; respondent is cooking sticks and weeds; respondent has welling in both legs; respondent is danger to herself". Patient denies SI, HI, AH or VH.  How Long Has This Been Causing You Problems? <Week  What Do You Feel Would Help You the Most Today? Treatment for Depression or other mood problem   Have You Recently Had Any Thoughts About Hurting Yourself? No  Are You Planning to Commit Suicide/Harm Yourself At This time? No   Flowsheet Row ED from 01/27/2023 in [facility] Emergency Department at [facility] ED from 11/17/2022 in [facility] Emergency Department at [facility] ED from 01/29/2022 in [facility] Emergency Department at [facility]  C-SSRS RISK CATEGORY No Risk No Risk No Risk       Have you Recently Had Thoughts About Hurting Someone Karolee Ohs? No  Are You Planning to Harm Someone at This Time? No  Explanation: N/A   Have You Used Any Alcohol or Drugs in the Past 24 Hours? No  What Did You Use and How Much? N/A  Do You Currently Have a Therapist/Psychiatrist? -- (UTA)  Name of Therapist/Psychiatrist: Name of Therapist/Psychiatrist: UTA   Have You Been Recently Discharged  From Any Office Practice or Programs? Yes  Explanation of Discharge From Practice/Program: Discharged from [facility] in February 2024     CCA Screening Triage Referral Assessment Type of Contact: Tele-Assessment  Telemedicine Service Delivery: Telemedicine service delivery: This service was provided via telemedicine using a 2-way, interactive audio and video technology  Is this Initial or Reassessment? Is this Initial or Reassessment?: Initial Assessment  Date Telepsych consult ordered in [facility]:  Date Telepsych consult ordered in [facility]: 01/27/23  Time Telepsych consult ordered in [facility]:  Time Telepsych consult ordered in [facility]: 2344  Location of Assessment: [facility] ED  Provider Location: [facility] Assessment Services   Collateral Involvement: Unable to reach patient's spouse   Does Patient Have a Automotive engineer Guardian? No  Legal Guardian Contact Information: N/A  Copy of Legal Guardianship Form: -- (N/A)  Legal Guardian Notified of Arrival: -- (N/A)  Legal Guardian Notified of Pending Discharge: -- (N/A)  If Minor and Not Living with Parent(s), Who has Custody? N/A  Is CPS involved or ever been involved? Never  Is APS involved or ever been involved? Never   Patient Determined To Be At Risk for Harm To Self or Others Based on Review of Patient Reported Information or Presenting Complaint? No (denies SI/HI)  Method: No Plan (denies SI/HI)  Availability of Means: No access or NA (denies SI/HI)  Intent: Vague intent or NA (denies SI/HI)  Notification Required: No need or identified person (denies SI/HI)  Additional Information for Danger to Others Potential: -- (N/A)  Additional Comments for Danger to Others Potential: N/A  Are There Guns or Other Weapons in Your Home? No  Types of Guns/Weapons: N/A  Are These Weapons Safely Secured?                            -- (N/A)  Who Could Verify You Are Able To Have These Secured: N/A  Do You Have any Outstanding  Charges, Pending Court Dates, Parole/Probation? UTA  Contacted To Inform of Risk of Harm To Self or Others: -- (N/A)    Does Patient Present under Involuntary Commitment? Yes    [place] of Residence: [place]   Patient Currently Receiving the Following Services: Medication Management   Determination of Need: Emergent (2 hours)   Options For Referral: Inpatient Hospitalization     CCA Biopsychosocial Patient Reported Schizophrenia/Schizoaffective Diagnosis in Past: Yes   Strengths: UTA   Mental Health Symptoms Depression:   -- (UTA)   Duration of Depressive symptoms:    Mania:   -- (UTA)   Anxiety:    -- (UTA)   Psychosis:   -- (UTA)   Duration of Psychotic symptoms:    Trauma:   -- (UTA)   Obsessions:   -- (UTA)   Compulsions:   -- (UTA)   Inattention:   None   Hyperactivity/Impulsivity:   None   Oppositional/Defiant Behaviors:   None   Emotional Irregularity:   None   Other Mood/Personality Symptoms:   N/A    Mental Status Exam Appearance and self-care  Stature:   Average   Weight:   Overweight   Clothing:   -- (Scrubs)   Grooming:   Normal   Cosmetic use:   None   Posture/gait:   Normal   Motor activity:   Not Remarkable  Sensorium  Attention:   Distractible   Concentration:   Normal   Orientation:   Person; Place; Object   Recall/memory:   Defective in Short-term   Affect and Mood  Affect:   Flat   Mood:   Other (Comment) (Calm)   Relating  Eye contact:   Normal   Facial expression:   Sad   Attitude toward examiner:   Passive   Thought and Language  Speech flow:  Normal   Thought content:   Appropriate to Mood and Circumstances   Preoccupation:   None   Hallucinations:   None   Organization:   Loose   Company secretary of Knowledge:   Average   Intelligence:   Average   Abstraction:   Concrete   Judgement:   Impaired   Reality Testing:   Distorted    Insight:   Lacking   Decision Making:   Only simple   Social Functioning  Social Maturity:   Impulsive   Social Judgement:   Normal   Stress  Stressors:   Other (Comment) (Patient reports none)   Coping Ability:   Overwhelmed   Skill Deficits:   Self-care   Supports:   -- Industrial/product designer)     Religion: Religion/Spirituality Are You A Religious Person?:  (UTA) How Might This Affect Treatment?: UTA  Leisure/Recreation: Leisure / Recreation Do You Have Hobbies?:  (UTA)  Exercise/Diet: Exercise/Diet Do You Exercise?:  (UTA) Have You Gained or Lost A Significant Amount of Weight in the Past Six Months?:  (UTA) Do You Follow a Special Diet?:  (UTA) Do You Have Any Trouble Sleeping?:  (UTA)   CCA Employment/Education Employment/Work Situation: Employment / Work Situation Employment Situation:  Industrial/product designer) Patient's Job has Been Impacted by Current Illness:  (UTA) Has Patient ever Been in the [employer]?: No  Education: Education Is Patient Currently Attending School?: No Last Grade Completed:  (UTA) Did You Attend College?:  (UTA) Did You Have An Individualized Education Program (IIEP):  (UTA) Did You Have Any Difficulty At School?:  (UTA) Patient's Education Has Been Impacted by Current Illness:  (UTA)   CCA Family/Childhood History Family and Relationship History: Family history Marital status: Married Number of Years Married:  (UTA) What types of issues is patient dealing with in the relationship?: UTA Additional relationship information: UTA Does patient have children?:  (UTA)  Childhood History:  Childhood History By whom was/is the patient raised?:  (UTA) Did patient suffer any verbal/emotional/physical/sexual abuse as a child?:  (UTA) Did patient suffer from severe childhood neglect?:  (UTA) Has patient ever been sexually abused/assaulted/raped as an adolescent or adult?:  (UTA) Was the patient ever a victim of a crime or a disaster?:  (UTA) Witnessed  domestic violence?:  (UTA) Has patient been affected by domestic violence as an adult?:  (UTA)       CCA Substance Use Alcohol/Drug Use: Alcohol / Drug Use Pain Medications: See MAR Prescriptions: See MAR Over the Counter: See MAR History of alcohol / drug use?: No history of alcohol / drug abuse Longest period of sobriety (when/how long): N/A Negative Consequences of Use:  (N/A) Withdrawal Symptoms:  (N/A)                         ASAM's:  Six Dimensions of Multidimensional Assessment  Dimension 1:  Acute Intoxication and/or Withdrawal Potential:      Dimension 2:  Biomedical Conditions and Complications:      Dimension 3:  Emotional, Behavioral, or Cognitive Conditions and Complications:     Dimension 4:  Readiness to Change:     Dimension 5:  Relapse, Continued use, or Continued Problem Potential:     Dimension 6:  Recovery/Living Environment:     ASAM Severity Score:    ASAM Recommended Level of Treatment:     Substance use Disorder (SUD)    Recommendations for Services/Supports/Treatments:    Discharge Disposition:    DSM5 Diagnoses: Patient Active Problem List   Diagnosis Date Noted   Delusional disorder (HCC) 10/23/2022   Hyponatremia 02/27/2020   ARF (acute renal failure) (HCC) 02/27/2020   Hypokalemia 02/27/2020   Schizoaffective disorder (HCC)    OBESITY 01/03/2009   Uncontrolled hypertension 06/17/2007     Referrals to Alternative Service(s): Referred to Alternative Service(s):   Place:   Date:   Time:    Referred to Alternative Service(s):   Place:   Date:   Time:    Referred to Alternative Service(s):   Place:   Date:   Time:    Referred to Alternative Service(s):   Place:   Date:   Time:     Cleda Clarks, LCSW

## 2023-01-28 NOTE — ED Provider Notes (Signed)
Emergency Medicine Observation Re-evaluation Note  Sally Wang is a 62 y.o. female, seen on rounds today.  Pt initially presented to the ED for complaints of Psychiatric Evaluation Currently, the patient is awake. Noted to be hyperverbal. Has declined taking her meds.  Physical Exam  BP (!) 161/98 (BP Location: Right Arm)   Pulse 75   Temp 97.6 F (36.4 C) (Oral)   Resp 15   Ht 5\' 7"  (1.702 m)   Wt 105 kg   LMP 02/16/2015   SpO2 99%   BMI 36.26 kg/m  Physical Exam General: no distress Cardiac: regulae rate Lungs: no resp distress Psych: calm, but hyperverbal and tangential  ED Course / MDM  EKG:EKG Interpretation  Date/Time:  Thursday Jan 28 2023 06:56:27 EDT Ventricular Rate:  69 PR Interval:  158 QRS Duration: 88 QT Interval:  420 QTC Calculation: 450 R Axis:   -26 Text Interpretation: Normal sinus rhythm with sinus arrhythmia Nonspecific ST and T wave abnormality Abnormal ECG When compared with ECG of 17-Nov-2022 17:22, PREVIOUS ECG IS PRESENT Nonspecific ST abnormality Confirmed by Glynn Octave [phone]) on 01/28/2023 7:04:33 AM  I have reviewed the labs performed to date as well as medications administered while in observation.  Recent changes in the last 24 hours include no new changes.  Plan  Current plan is for for likely admission.    Derwood Kaplan, MD 01/28/23 [id number]

## 2023-01-28 NOTE — Progress Notes (Signed)
LCSW Progress Note  [id number]   Sally Wang  01/28/2023  11:06 AM  Description:   Inpatient Psychiatric Referral  Patient was recommended inpatient per Joaquin Courts, NP. There are no available beds at [facility], per [facility], RN. Patient is currently under review at [facility], pending discharges. Patient was referred to the following out of network facilities:   [facility] Provider Address Phone Fax  [facility]  [address], [place] [address] [phone]  [facility] [address]  [address] [place] [address] [phone]  [facility]  [address] [place], [place] [address] [phone]  [facility] [place] [address] [phone]  [facility]  [address] [place] [address] [phone]  [facility]  [address] [place], [place] [address] [phone]  [facility]  [address] [place] [address] [phone]  [facility]  [address] [place] [address] [phone]  [facility]  [address], [place] [address] [phone]  [facility]  [address] [place] [address] [phone]  [facility]  [address], [place] [address] [phone]  [facility]  [address] [place] [address] [phone]  [facility]  [address], [place] [address] [phone]  [facility]  [address] [place] [address] [phone]  [facility]  [address], [place] [address] [phone]  [facility]  [address] [place] [address] [phone]  [facility]  [address], [place] [address] [phone]  [facility]  [address], [place] [address] [phone]  [facility]  [address] [place] [address] [phone]    Situation ongoing, CSW to continue following and update chart as more information becomes available.      Cathie Beams, [place]  01/28/2023 11:06 AM

## 2023-01-28 NOTE — Progress Notes (Signed)
Pt was accepted to [facility] 01/29/2023. Bed assignment: Main campus  Pt meets inpatient criteria per Joaquin Courts, NP  Attending Physician will be Loni Beckwith, MD  Report can be called to: [phone] (this is a pager, please leave call-back number when giving report)  Pt can arrive after 8 AM  Care Team Notified: [facility], RN, Joaquin Courts, NP, and Tresa Moore, RN  [place], [place]  01/28/2023 11:37 AM

## 2023-01-28 NOTE — ED Notes (Signed)
Pt continues to have a rambling undirected conversation with self and whom ever will listen. She continues to refuse assessments EDP at bedside Geodon ativan order obtained and given. No hands on requires pt voluntarily allow staff to administer IM  medication.

## 2023-01-28 NOTE — ED Notes (Signed)
Pt. Woke up and is taking her second shower.

## 2023-01-28 NOTE — Progress Notes (Signed)
Pharmacist Review of Prior to Admission Medications  Assessment Unable to perform medication history with patient d/t psych issues Recent fill history for Depakote, Zyprexa, Risperdal noted not to match historical medication list (last obtained in February) After discussion with MD, will modify orders for above medications to match most recent fill history  Plan Depakote ER 1000 mg PO qHS Zyprexa 15 mg PO BID Risperdal 2 mg PO bid  Bernadene Person, PharmD, BCPS [phone] 01/28/2023, 2:45 PM

## 2023-01-29 DIAGNOSIS — Z794 Long term (current) use of insulin: Secondary | ICD-10-CM | POA: Diagnosis not present

## 2023-01-29 DIAGNOSIS — F29 Unspecified psychosis not due to a substance or known physiological condition: Secondary | ICD-10-CM | POA: Diagnosis not present

## 2023-01-29 DIAGNOSIS — I10 Essential (primary) hypertension: Secondary | ICD-10-CM | POA: Diagnosis not present

## 2023-01-29 DIAGNOSIS — Z79899 Other long term (current) drug therapy: Secondary | ICD-10-CM | POA: Diagnosis not present

## 2023-01-29 DIAGNOSIS — E669 Obesity, unspecified: Secondary | ICD-10-CM | POA: Diagnosis not present

## 2023-01-29 DIAGNOSIS — Z7984 Long term (current) use of oral hypoglycemic drugs: Secondary | ICD-10-CM | POA: Diagnosis not present

## 2023-01-29 DIAGNOSIS — F259 Schizoaffective disorder, unspecified: Secondary | ICD-10-CM | POA: Diagnosis not present

## 2023-01-29 DIAGNOSIS — E119 Type 2 diabetes mellitus without complications: Secondary | ICD-10-CM | POA: Diagnosis not present

## 2023-01-29 DIAGNOSIS — Z91148 Patient's other noncompliance with medication regimen for other reason: Secondary | ICD-10-CM | POA: Diagnosis not present

## 2023-01-29 DIAGNOSIS — R0689 Other abnormalities of breathing: Secondary | ICD-10-CM | POA: Diagnosis not present

## 2023-01-29 DIAGNOSIS — Z6836 Body mass index (BMI) 36.0-36.9, adult: Secondary | ICD-10-CM | POA: Diagnosis not present

## 2023-01-29 DIAGNOSIS — F250 Schizoaffective disorder, bipolar type: Secondary | ICD-10-CM | POA: Diagnosis not present

## 2023-01-29 LAB — RAPID URINE DRUG SCREEN, HOSP PERFORMED
Amphetamines: NOT DETECTED
Barbiturates: NOT DETECTED
Benzodiazepines: NOT DETECTED
Cocaine: NOT DETECTED
Opiates: NOT DETECTED
Tetrahydrocannabinol: NOT DETECTED

## 2023-01-29 LAB — URINALYSIS, ROUTINE W REFLEX MICROSCOPIC
Bilirubin Urine: NEGATIVE
Glucose, UA: NEGATIVE mg/dL
Hgb urine dipstick: NEGATIVE
Ketones, ur: NEGATIVE mg/dL
Leukocytes,Ua: NEGATIVE
Nitrite: NEGATIVE
Protein, ur: NEGATIVE mg/dL
Specific Gravity, Urine: 1.01 (ref 1.005–1.030)
pH: 6 (ref 5.0–8.0)

## 2023-01-29 NOTE — ED Notes (Signed)
Unable to obtain urine sample. Patient did not wait.

## 2023-01-29 NOTE — ED Notes (Signed)
Patient in bed sleeping.

## 2023-01-29 NOTE — ED Notes (Signed)
Patient discharged off unit to facility per provider.  Patient did not want to go to [facility] but was cooperative with [place]. Patient had no s/s of distress at this time.  Patient ambulatory, escorted and transported by sheriff.

## 2023-01-29 NOTE — ED Provider Notes (Signed)
Pt accepted to [facility], pending transport  Plan to transfer patient to [facility] for further evaluation and management of psychiatric disorder. Reason for transfer: escalation of care. Discussed findings and plan with patient or guardian. Patient or guardian agrees with plan and transfer. Accepting physician, Dr. Sofie Hartigan. EMTALA form completed and signed. Patient will be sent with paperwork and imaging, if done, from today's visit.    Sloan Leiter, DO 01/29/23 [phone]

## 2023-01-29 NOTE — ED Notes (Signed)
Patient agitated and upset. Patient BP elevated. Patient talking and not cooperative during VS.  Provider made aware. Patient has no s/s of distress at this time and no chest pain.. Patient being transported to [facility].

## 2023-01-29 NOTE — Discharge Instructions (Signed)
It was a pleasure caring for you today in the emergency department. ° °Please return to the emergency department for any worsening or worrisome symptoms. ° ° °

## 2023-01-30 DIAGNOSIS — F250 Schizoaffective disorder, bipolar type: Secondary | ICD-10-CM | POA: Diagnosis not present

## 2023-02-24 DIAGNOSIS — F250 Schizoaffective disorder, bipolar type: Secondary | ICD-10-CM | POA: Diagnosis not present

## 2023-04-06 DIAGNOSIS — F250 Schizoaffective disorder, bipolar type: Secondary | ICD-10-CM | POA: Diagnosis not present

## 2023-11-24 ENCOUNTER — Emergency Department (HOSPITAL_COMMUNITY)
Admission: EM | Admit: 2023-11-24 | Discharge: 2023-11-25 | Disposition: A | Discharge status: Other Acute Inpatient Hospital | Attending: Emergency Medicine | Admitting: Emergency Medicine

## 2023-11-24 ENCOUNTER — Encounter (HOSPITAL_COMMUNITY): Payer: Self-pay

## 2023-11-24 ENCOUNTER — Other Ambulatory Visit: Payer: Self-pay

## 2023-11-24 DIAGNOSIS — I10 Essential (primary) hypertension: Secondary | ICD-10-CM | POA: Insufficient documentation

## 2023-11-24 DIAGNOSIS — Z046 Encounter for general psychiatric examination, requested by authority: Secondary | ICD-10-CM

## 2023-11-24 DIAGNOSIS — Z1330 Encounter for screening examination for mental health and behavioral disorders, unspecified: Secondary | ICD-10-CM | POA: Insufficient documentation

## 2023-11-24 DIAGNOSIS — F250 Schizoaffective disorder, bipolar type: Secondary | ICD-10-CM | POA: Diagnosis not present

## 2023-11-24 DIAGNOSIS — Z79899 Other long term (current) drug therapy: Secondary | ICD-10-CM | POA: Diagnosis not present

## 2023-11-24 DIAGNOSIS — R259 Unspecified abnormal involuntary movements: Secondary | ICD-10-CM | POA: Diagnosis not present

## 2023-11-24 DIAGNOSIS — Z008 Encounter for other general examination: Secondary | ICD-10-CM

## 2023-11-24 LAB — CBC
HCT: 46.8 % — ABNORMAL HIGH (ref 36.0–46.0)
Hemoglobin: 15.1 g/dL — ABNORMAL HIGH (ref 12.0–15.0)
MCH: 29.7 pg (ref 26.0–34.0)
MCHC: 32.3 g/dL (ref 30.0–36.0)
MCV: 91.9 fL (ref 80.0–100.0)
Platelets: 313 10*3/uL (ref 150–400)
RBC: 5.09 MIL/uL (ref 3.87–5.11)
RDW: 12.6 % (ref 11.5–15.5)
WBC: 4.6 10*3/uL (ref 4.0–10.5)
nRBC: 0 % (ref 0.0–0.2)

## 2023-11-24 LAB — BASIC METABOLIC PANEL
Anion gap: 9 (ref 5–15)
BUN: 17 mg/dL (ref 8–23)
CO2: 24 mmol/L (ref 22–32)
Calcium: 9.8 mg/dL (ref 8.9–10.3)
Chloride: 104 mmol/L (ref 98–111)
Creatinine, Ser: 0.97 mg/dL (ref 0.44–1.00)
GFR, Estimated: >60 mL/min (ref >60)
Glucose, Bld: 108 mg/dL — ABNORMAL HIGH (ref 70–99)
Potassium: 3.8 mmol/L (ref 3.5–5.1)
Sodium: 137 mmol/L (ref 135–145)

## 2023-11-24 LAB — SALICYLATE LEVEL: Salicylate Lvl: <7 mg/dL — ABNORMAL LOW (ref 7.0–30.0)

## 2023-11-24 LAB — ACETAMINOPHEN LEVEL: Acetaminophen (Tylenol), Serum: <10 ug/mL — ABNORMAL LOW (ref 10–30)

## 2023-11-24 LAB — ETHANOL: Alcohol, Ethyl (B): <10 mg/dL (ref <10)

## 2023-11-24 MED ORDER — ZIPRASIDONE MESYLATE 20 MG IM SOLR
10.0000 mg | Freq: Four times a day (QID) | INTRAMUSCULAR | Status: DC | PRN
  Administered 2023-11-25: 10 mg via INTRAMUSCULAR
  Filled 2023-11-24: qty 20

## 2023-11-24 MED ORDER — OLANZAPINE 5 MG PO TBDP: 5.0000 mg | ORAL_TABLET | Freq: Every day | ORAL | Status: DC

## 2023-11-24 MED ORDER — LORAZEPAM 2 MG/ML IJ SOLN: 2.0000 mg | Freq: Four times a day (QID) | INTRAMUSCULAR | Status: DC | PRN

## 2023-11-24 MED ORDER — BENZTROPINE MESYLATE 0.5 MG PO TABS: 1.0000 mg | ORAL_TABLET | Freq: Two times a day (BID) | ORAL | Status: DC | PRN

## 2023-11-24 MED ORDER — BENZTROPINE MESYLATE 1 MG/ML IJ SOLN: 1.0000 mg | Freq: Two times a day (BID) | INTRAMUSCULAR | Status: DC | PRN

## 2023-11-24 MED ORDER — DIPHENHYDRAMINE HCL 50 MG/ML IJ SOLN: 50.0000 mg | Freq: Four times a day (QID) | INTRAMUSCULAR | Status: DC | PRN

## 2023-11-24 NOTE — ED Notes (Addendum)
 Pt refused to dress out into purple scrubs on day shift and at [employer]

## 2023-11-24 NOTE — Consult Note (Signed)
 [facility] Consult Note  Patient Name: Sally Wang MRN: [id number] DOB: May 13, 1961 DATE OF Consult: 11/24/2023  PRIMARY PSYCHIATRIC DIAGNOSES   1.  Schizoaffective Disorder, Bipolar Type   RECOMMENDATIONS  Recommendations: Medication recommendations: Have written for Zyprexa Zydis 5 mg SL at bedtime for psychosis/mood dysregulation.  PRN's:  Geodon, 10 mg IM q6h PRN and Ativan 2 mg IM q6h PRN and Benadryl, 50 mg IM q6h PRN severe agitation;aggression.  Also Cogentin 1 mg po/IM bid PRN EPS. Non-Medication/therapeutic recommendations: Patient is quite psychotic and agitated, and she will require close observation until she can safely be admitted to a psychiatric service.  Continue with matter-of-fact emotional support in ED, pending transfer Is inpatient psychiatric hospitalization recommended for this patient? Yes (Explain why): Patient is currently quite psychotic and agitated, with loose associations, pressured speech, and sudden mood lability with threatening actions.  Admitted to staff that is hearing voices to hurt her husband, and IVC petition alleges that the patient has been eating grass.  Given her psychosis and dangerousness, she meets IVC criteria for emergency admission for assessment and treatment. Follow-Up Telepsychiatry C/L services: We will sign off for now. Please re-consult our service if needed for any concerning changes in the patient's condition, discharge planning, or questions. Communication: Treatment team members (and family members if applicable) who were involved in treatment/care discussions and planning, and with whom we spoke or engaged with via secure text/chat, include the following: Secure message sent to Mr. Vevelyn Pat, ED provider, and staff, outlining current recommendations.  Thank you for involving [place] in the care of this patient. If you have any additional questions or concerns, please call [phone] and ask for the provider on-call.    TELEPSYCHIATRY ATTESTATION & CONSENT   As the provider for this telehealth consult, I attest that I verified the patient's identity using two separate identifiers, introduced myself to the patient, provided my credentials, disclosed my location, and performed this encounter via a HIPAA-compliant, real-time, face-to-face, two-way, interactive audio and video platform and with the full consent and agreement of the patient (or guardian as applicable.)  Patient physical location: [facility]. Telehealth provider physical location: home office in state of [place].  Video start time: 2310 EDT  Video end time: 2325h EDT   Total time spent in this encounter was 30 minutes, including record review, clinical interview, behavior observations, discussion of impressions and recommendations (including medications and hospitalization), and consultation/communication with relevant parties   IDENTIFYING DATA  Sally Wang is a 63 y.o. year-old female for whom a psychiatric consultation has been ordered by the primary provider. The patient was identified using two separate identifiers.  CHIEF COMPLAINT/REASON FOR CONSULT   I don't need to be involuntary, because that's against the law, L-A-W, L is for lady, and the woman holds the scales of justice.   HISTORY OF PRESENT ILLNESS (HPI)   The patient presents with a long history of schizoaffective disorder, now brought to the ED acutely agitated, on an IVC.  Patient has a long history of psychotic illness, with multiple hospitalizations and treatment regimens through the years.  Patient always stops medications when she gets out of the hospital and refuses follow-up.  Periodically she became quite psychotic, and she has a history of trying to eat weeds or grass when psychotic, and she can become physically violent.  Patient refused to answer questions, but did talk extensively about the injustice of the legal system, with marked loosening of associations and  delusions of grandeur and  persecution.  Patient denies auditory hallucinations to me, but told staff that she has been hearing voices to hurt her husband.  Unable to get any further information from patient, given her level of agitation and threatening behavior.  Marland Kitchen  PAST PSYCHIATRIC HISTORY   Otherwise as per HPI above.  PAST MEDICAL HISTORY  Past Medical History:  Diagnosis Date   Hypertension    Schizoaffective disorder [facility])      HOME MEDICATIONS  Facility Ordered Medications  Medication   ziprasidone (GEODON) injection 10 mg   LORazepam (ATIVAN) injection 2 mg   diphenhydrAMINE (BENADRYL) injection 50 mg   OLANZapine zydis (ZYPREXA) disintegrating tablet 5 mg   benztropine (COGENTIN) tablet 1 mg   Or   benztropine mesylate (COGENTIN) injection 1 mg   PTA Medications  Medication Sig   amLODipine (NORVASC) 5 MG tablet Take 2 tablets (10 mg total) by mouth daily. (Patient not taking: Reported on 01/28/2023)   OLANZapine zydis (ZYPREXA) 5 MG disintegrating tablet Take 1 tablet (5 mg total) by mouth 2 (two) times daily. (Patient not taking: Reported on 01/28/2023)   furosemide (LASIX) 20 MG tablet Take 1 tablet (20 mg total) by mouth daily. (Patient not taking: Reported on 11/24/2023)     ALLERGIES  No Known Allergies  SOCIAL & SUBSTANCE USE HISTORY  Social History   Socioeconomic History   Marital status: Married    Spouse name: [name]   Number of children: Not on file   Years of education: Not on file   Highest education level: Not on file  Occupational History   Occupation: homemaker  Tobacco Use   Smoking status: Never   Smokeless tobacco: Never  Vaping Use   Vaping status: Never Used  Substance and Sexual Activity   Alcohol use: No   Drug use: No   Sexual activity: Not Currently  Other Topics Concern   Not on file  Social History Narrative   Husband - Britten Parady 8295   Tomie China -1987   [name] -2010 granddaughte      Pets -  Pit bulls #4             Social Drivers of Corporate investment banker Strain: Not on file  Food Insecurity: Not on file  Transportation Needs: Not on file  Physical Activity: Not on file  Stress: Not on file  Social Connections: Not on file   Social History   Tobacco Use  Smoking Status Never  Smokeless Tobacco Never   Social History   Substance and Sexual Activity  Alcohol Use No   Social History   Substance and Sexual Activity  Drug Use No    .  FAMILY HISTORY  Family History  Problem Relation Age of Onset   Coronary artery disease Father        died 52 yo   Diabetes Mother        died 31   Hypertension Brother      MENTAL STATUS EXAM (MSE)  Mental Status Exam: General Appearance: Fairly Groomed  Orientation:  Full (Time, Place, and Person)  Memory:   Difficult to assess, given her level of disorganized thought and agitation  Concentration:  Concentration: Poor and Attention Span: Poor  Recall:   Difficult to assess, given her level of disorganized thought and agitation  Attention  Poor  Eye Contact:  Absent  Speech:  Pressured  Language:  Good  Volume:  Increased  Mood: I'm fine, and you can't take away my rights.  Affect:  Inappropriate and Labile  Thought Process:  Disorganized and Descriptions of Associations: Loose  Thought Content:  Delusions and Hallucinations: Command:  auditory hallucinations to kill her husband.  Suicidal Thoughts:   No report of suicidal thoughts, but difficult to assess, given her level of disorganized thought and agitation  Homicidal Thoughts:  Yes.  with intent/plan  Judgement:  Impaired  Insight:  Lacking  Psychomotor Activity:  Increased and Restlessness  Akathisia:  Negative  Fund of Knowledge:   Difficult to assess, given her level of disorganized thought and agitation    Assets:  Communication Skills Housing Social Support  Cognition:  WNL  ADL's:  Intact  AIMS (if indicated):       VITALS  Blood pressure 130/77, pulse (!) 121,  temperature 98.2 F (36.8 C), temperature source Oral, resp. rate 20, height 5\' 7"  (1.702 m), weight 105 kg, last menstrual period 02/16/2015, SpO2 99%.  LABS  Admission on 11/24/2023  Component Date Value Ref Range Status   WBC 11/24/2023 4.6  4.0 - 10.5 K/uL Final   RBC 11/24/2023 5.09  3.87 - 5.11 MIL/uL Final   Hemoglobin 11/24/2023 15.1 (H)  12.0 - 15.0 g/dL Final   HCT 16/06/9603 46.8 (H)  36.0 - 46.0 % Final   MCV 11/24/2023 91.9  80.0 - 100.0 fL Final   MCH 11/24/2023 29.7  26.0 - 34.0 pg Final   MCHC 11/24/2023 32.3  30.0 - 36.0 g/dL Final   RDW 54/05/8118 12.6  11.5 - 15.5 % Final   Platelets 11/24/2023 313  150 - 400 K/uL Final   nRBC 11/24/2023 0.0  0.0 - 0.2 % Final   Performed at [facility], [address], [place], [place] [address]   Sodium 11/24/2023 137  135 - 145 mmol/L Final   Potassium 11/24/2023 3.8  3.5 - 5.1 mmol/L Final   Chloride 11/24/2023 104  98 - 111 mmol/L Final   CO2 11/24/2023 24  22 - 32 mmol/L Final   Glucose, Bld 11/24/2023 108 (H)  70 - 99 mg/dL Final   Glucose reference range applies only to samples taken after fasting for at least 8 hours.   BUN 11/24/2023 17  8 - 23 mg/dL Final   Creatinine, Ser 11/24/2023 0.97  0.44 - 1.00 mg/dL Final   Calcium 95/62/1308 9.8  8.9 - 10.3 mg/dL Final   GFR, Estimated 11/24/2023 >60  >60 mL/min Final   Comment: (NOTE) Calculated using the CKD-EPI Creatinine Equation (2021)    Anion gap 11/24/2023 9  5 - 15 Final   Performed at [facility], [address] [place], [place] [address]   Salicylate Lvl 11/24/2023 <7.0 (L)  7.0 - 30.0 mg/dL Final   Performed at [facility], [address], [place], [place] [address]   Acetaminophen (Tylenol), Serum 11/24/2023 <10 (L)  10 - 30 ug/mL Final   Comment: (NOTE) Therapeutic concentrations vary significantly. A range of 10-30 ug/mL  may be an effective concentration for many patients. However, some  are best  treated at concentrations outside of this range. Acetaminophen concentrations >150 ug/mL at 4 hours after ingestion  and >50 ug/mL at 12 hours after ingestion are often associated with  toxic reactions.  Performed at [facility], [address], [place], [place] [address]    Alcohol, Ethyl (B) 11/24/2023 <10  <10 mg/dL Final   Comment: (NOTE) Lowest detectable limit for serum alcohol is 10 mg/dL.  For medical purposes only.  Performed at [facility], [address] [place], [place] [address]     PSYCHIATRIC REVIEW OF SYSTEMS (ROS)  ROS: Notable for the following relevant positive findings: Review of Systems  Constitutional: Negative.   HENT: Negative.    Eyes: Negative.   Respiratory: Negative.    Cardiovascular: Negative.   Gastrointestinal: Negative.   Genitourinary: Negative.   Musculoskeletal: Negative.   Skin: Negative.   Psychiatric/Behavioral:  Positive for hallucinations.     Additional findings:      Musculoskeletal: No abnormal movements observed      Gait & Station: Normal      Pain Screening: Denies      Nutrition & Dental Concerns: Reviewed   RISK FORMULATION/ASSESSMENT  Is the patient experiencing any suicidal or homicidal ideations: Yes       Explain if yes: Patient admitted to staff that she is hearing voices telling her to harm her husband.  She is quite emotionally labile and aggressive in the ED.  Protective factors considered for safety management:   Patient has been resistant to treatment, and outpatient and family resources unable to provide safe environment.  Risk factors/concerns considered for safety management:  Access to lethal means Age over 26 Impulsivity Aggression Unwillingness to seek help Unmarried  Is there a safety management plan with the patient and treatment team to minimize risk factors and promote protective factors: No           Explain: As above, outpatient resources have been  unable to provide an adequately safe environment, given her symptoms  Is crisis care placement or psychiatric hospitalization recommended: Yes     Based on my current evaluation and risk assessment, patient is determined at this time to be at:  High risk  *RISK ASSESSMENT Risk assessment is a dynamic process; it is possible that this patient's condition, and risk level, may change. This should be re-evaluated and managed over time as appropriate. Please re-consult psychiatric consult services if additional assistance is needed in terms of risk assessment and management. If your team decides to discharge this patient, please advise the patient how to best access emergency psychiatric services, or to call 911, if their condition worsens or they feel unsafe in any way.   Ezekiel Slocumb, MD Telepsychiatry Consult Services

## 2023-11-24 NOTE — ED Provider Notes (Cosign Needed Addendum)
 [place] EMERGENCY DEPARTMENT AT [facility] Provider Note   CSN: [id number] Arrival date & time: 11/24/23  1757     History  Chief Complaint  Patient presents with   IVC    Sally Wang is a 63 y.o. female with medical history of hypertension, schizoaffective disorder, delusional disorder.  The patient presents to the ED under IVC.  Per IVC paperwork, the patient is refusing to take her medications.  She is hostile and aggressive towards her family members.  She reports that she is hearing voices that are commanding her to harm her husband.  Apparently the patient was noted to be eating grass outside and covering herself in foods.  On my examination, the patient is uncooperative.  She will not answer my questions.  She would not allow me to physically examine her.  She denies any medical complaints.  HPI     Home Medications Prior to Admission medications   Medication Sig Start Date End Date Taking? Authorizing Provider  amLODipine (NORVASC) 5 MG tablet Take 2 tablets (10 mg total) by mouth daily. Patient not taking: Reported on 01/28/2023 03/04/20   Marguerita Merles Latif, DO  furosemide (LASIX) 20 MG tablet Take 1 tablet (20 mg total) by mouth daily. Patient not taking: Reported on 11/24/2023 11/17/22 11/24/23  Redwine, Madison A, PA-C  OLANZapine zydis (ZYPREXA) 5 MG disintegrating tablet Take 1 tablet (5 mg total) by mouth 2 (two) times daily. Patient not taking: Reported on 01/28/2023 03/04/20   Merlene Laughter, DO      Allergies    Patient has no known allergies.    Review of Systems   Review of Systems  Unable to perform ROS: Other (Level 5 caveat)    Physical Exam Updated Vital Signs BP 130/77   Pulse (!) 121   Temp 98.2 F (36.8 C) (Oral)   Resp 20   Ht 5\' 7"  (1.702 m)   Wt 105 kg   LMP 02/16/2015   SpO2 99%   BMI 36.26 kg/m  Physical Exam Vitals and nursing note reviewed.  Constitutional:      General: She is not in acute distress.     Appearance: She is not toxic-appearing.  HENT:     Head: Normocephalic and atraumatic.  Pulmonary:     Effort: No respiratory distress.  Skin:    Coloration: Skin is not jaundiced or pale.  Neurological:     Mental Status: She is alert and oriented to person, place, and time.  Psychiatric:        Behavior: Behavior normal.     ED Results / Procedures / Treatments   Labs (all labs ordered are listed, but only abnormal results are displayed) Labs Reviewed  CBC - Abnormal; Notable for the following components:      Result Value   Hemoglobin 15.1 (*)    HCT 46.8 (*)    All other components within normal limits  BASIC METABOLIC PANEL - Abnormal; Notable for the following components:   Glucose, Bld 108 (*)    All other components within normal limits  SALICYLATE LEVEL - Abnormal; Notable for the following components:   Salicylate Lvl <7.0 (*)    All other components within normal limits  ACETAMINOPHEN LEVEL - Abnormal; Notable for the following components:   Acetaminophen (Tylenol), Serum <10 (*)    All other components within normal limits  ETHANOL  RAPID URINE DRUG SCREEN, HOSP PERFORMED    EKG None  Radiology No results found.  Procedures Procedures   Medications Ordered in ED Medications - No data to display  ED Course/ Medical Decision Making/ A&P  Medical Decision Making Amount and/or Complexity of Data Reviewed Labs: ordered.   63 year old female with schizoaffective disorder presents under IVC.  Please see HPI for further details.  On examination the patient is tachycardic, afebrile.  She would not allow me to physically examine her.  She arrives under IVC.  Her first examination form was filled out.  Labs pending at this time.  Patient labs show a CBC without leukocytosis or anemia.  Metabolic panel without electrolyte derangement.  Acetaminophen, ethanol, salicylate undetectable.  Patient medically cleared at this time for TTS  disposition.  Addendum: Dr. Elesa Hacker attempted to assess patient.  Patient still agitated.  He is going to put in as needed medications.  He is recommending IVC admission for psychiatric stabilization.  Will change patient to provider default.  Final Clinical Impression(s) / ED Diagnoses Final diagnoses:  Psychological assessment  Involuntary commitment    Rx / DC Orders ED Discharge Orders     None               Charlynne Pander, MD 11/24/23 2324    Al Decant, PA-C 11/24/23 2328    Charlynne Pander, MD 11/25/23 [phone]

## 2023-11-24 NOTE — ED Notes (Signed)
 Vitals attempted to be taken, however pt refusing advising to give her a few minutes.

## 2023-11-24 NOTE — ED Notes (Addendum)
 Pt dressed out into purple scrubs and personal items placed into bag and wanded.

## 2023-11-24 NOTE — ED Notes (Signed)
Tele-psych assessment being performed at this time.

## 2023-11-24 NOTE — BH Assessment (Signed)
 TTS Note:    @ 7:10 PM-Patient was deferred to [facility] Telehealth Coordinator, Lauren, at [phone]. The [facility] Care Coordinator will notify the patient's care team when the [facility] provider is ready to initiate the telehealth assessment. If there are any questions, please reach out to the [facility] Coordinator.

## 2023-11-24 NOTE — ED Triage Notes (Signed)
 Patient BIB [facility] Department under IVC. Patients paperwork states "Respondent is hostile and aggressive. Is diagnosed with schizophrenia and bipolar. Is refusing to take her medication. Is in a manic state and hearing voices to harm others. Eating grass from outside. Is not bathing and covers herself in mud and old food. Threatened to kill her husband. Not sleeping or eating.

## 2023-11-25 DIAGNOSIS — R456 Violent behavior: Secondary | ICD-10-CM | POA: Diagnosis not present

## 2023-11-25 DIAGNOSIS — F250 Schizoaffective disorder, bipolar type: Secondary | ICD-10-CM | POA: Diagnosis not present

## 2023-11-25 DIAGNOSIS — Z1330 Encounter for screening examination for mental health and behavioral disorders, unspecified: Secondary | ICD-10-CM | POA: Diagnosis not present

## 2023-11-25 DIAGNOSIS — Z79899 Other long term (current) drug therapy: Secondary | ICD-10-CM | POA: Diagnosis not present

## 2023-11-25 DIAGNOSIS — F068 Other specified mental disorders due to known physiological condition: Secondary | ICD-10-CM | POA: Diagnosis not present

## 2023-11-25 DIAGNOSIS — F29 Unspecified psychosis not due to a substance or known physiological condition: Secondary | ICD-10-CM | POA: Diagnosis not present

## 2023-11-25 DIAGNOSIS — Z681 Body mass index (BMI) 19 or less, adult: Secondary | ICD-10-CM | POA: Diagnosis not present

## 2023-11-25 DIAGNOSIS — I10 Essential (primary) hypertension: Secondary | ICD-10-CM | POA: Diagnosis not present

## 2023-11-25 LAB — RESP PANEL BY RT-PCR (RSV, FLU A&B, COVID)  RVPGX2
Influenza A by PCR: NEGATIVE
Influenza B by PCR: NEGATIVE
Resp Syncytial Virus by PCR: NEGATIVE
SARS Coronavirus 2 by RT PCR: NEGATIVE

## 2023-11-25 LAB — RAPID URINE DRUG SCREEN, HOSP PERFORMED
Amphetamines: NOT DETECTED
Barbiturates: NOT DETECTED
Benzodiazepines: NOT DETECTED
Cocaine: NOT DETECTED
Opiates: NOT DETECTED
Tetrahydrocannabinol: NOT DETECTED

## 2023-11-25 NOTE — ED Notes (Signed)
 Sheriff Department called.

## 2023-11-25 NOTE — ED Notes (Signed)
 Report given to Pervis Hocking RN at [facility].

## 2023-11-25 NOTE — ED Notes (Signed)
 Pt. Made aware for the need of urine specimen, unable to give at this time.

## 2023-11-25 NOTE — Progress Notes (Signed)
 LCSW Progress Note  [id number]   Sally Wang  11/25/2023  10:34 AM  Description:   Inpatient Psychiatric Referral  Patient was recommended inpatient per Dolores Hoose MD Malva Limes RN. There are no available beds at [facility], per [facility] (name). Patient was referred to the following out of network facilities:   [facility] Provider Address Phone Fax  [facility] [address] [place] [address] [phone]  [facility] [address] [place] [address] [phone]  [facility] [address] [place], [place] [address] [phone]  [facility] [address] [place] [address] [phone]  [facility] [address] [place] [address] [phone]  [facility] [address] [place] [address] [phone]  [facility] [address], [place] [address] [phone]  [facility] [address] [place] [address] [phone]  [facility] EFAX [address] [place], [place] [phone]  [facility] [address] [place] [address] [phone]      Situation ongoing, CSW to continue following and update chart as more information becomes available.     [name], MSW, LCSW  11/25/2023 10:34 AM

## 2023-11-25 NOTE — ED Notes (Signed)
 Patient is sitting up in chair against the wall, asleep. Patient was given ice water.

## 2023-11-25 NOTE — Progress Notes (Signed)
 Pt has been accepted to [facility] TODAY 11/25/2023  Bed assignment: Main campus  Pt meets inpatient criteria per: Dolores Hoose NP  Attending Physician will be Loni Beckwith, MD  Report can be called to: [phone] (this is a pager, please leave call-back number when giving report)  Pt can arrive ASAP   Care Team Notified: Alona Bene, PMHNP, Jacquelynn Cree NT, Chase Picket RN  [place] Deshmukh LCSW-A   11/25/2023 11:16 AM

## 2023-11-25 NOTE — ED Notes (Addendum)
 Pt. Has burgundy scrubs and wanded by security. Pt. has 1 belongings bag and 1 pr. Shoes, 1 beige t-shirt locked up in cabinet 16-22 at the nurses station.

## 2023-11-26 DIAGNOSIS — F250 Schizoaffective disorder, bipolar type: Secondary | ICD-10-CM | POA: Diagnosis not present

## 2023-11-26 DIAGNOSIS — F29 Unspecified psychosis not due to a substance or known physiological condition: Secondary | ICD-10-CM | POA: Diagnosis not present

## 2023-11-26 DIAGNOSIS — I10 Essential (primary) hypertension: Secondary | ICD-10-CM | POA: Diagnosis not present

## 2023-11-26 DIAGNOSIS — R456 Violent behavior: Secondary | ICD-10-CM | POA: Diagnosis not present

## 2023-11-26 DIAGNOSIS — Z681 Body mass index (BMI) 19 or less, adult: Secondary | ICD-10-CM | POA: Diagnosis not present

## 2023-11-26 DIAGNOSIS — F068 Other specified mental disorders due to known physiological condition: Secondary | ICD-10-CM | POA: Diagnosis not present

## 2023-11-27 DIAGNOSIS — R456 Violent behavior: Secondary | ICD-10-CM | POA: Diagnosis not present

## 2023-11-27 DIAGNOSIS — F29 Unspecified psychosis not due to a substance or known physiological condition: Secondary | ICD-10-CM | POA: Diagnosis not present

## 2023-11-27 DIAGNOSIS — F068 Other specified mental disorders due to known physiological condition: Secondary | ICD-10-CM | POA: Diagnosis not present

## 2023-11-27 DIAGNOSIS — I10 Essential (primary) hypertension: Secondary | ICD-10-CM | POA: Diagnosis not present

## 2023-11-27 DIAGNOSIS — F250 Schizoaffective disorder, bipolar type: Secondary | ICD-10-CM | POA: Diagnosis not present

## 2023-11-27 DIAGNOSIS — Z681 Body mass index (BMI) 19 or less, adult: Secondary | ICD-10-CM | POA: Diagnosis not present

## 2023-11-29 DIAGNOSIS — F250 Schizoaffective disorder, bipolar type: Secondary | ICD-10-CM | POA: Diagnosis not present

## 2023-11-29 DIAGNOSIS — I10 Essential (primary) hypertension: Secondary | ICD-10-CM | POA: Diagnosis not present

## 2023-11-29 DIAGNOSIS — F29 Unspecified psychosis not due to a substance or known physiological condition: Secondary | ICD-10-CM | POA: Diagnosis not present

## 2023-11-29 DIAGNOSIS — Z681 Body mass index (BMI) 19 or less, adult: Secondary | ICD-10-CM | POA: Diagnosis not present

## 2023-11-29 DIAGNOSIS — R456 Violent behavior: Secondary | ICD-10-CM | POA: Diagnosis not present

## 2023-11-29 DIAGNOSIS — F068 Other specified mental disorders due to known physiological condition: Secondary | ICD-10-CM | POA: Diagnosis not present

## 2023-11-30 DIAGNOSIS — R456 Violent behavior: Secondary | ICD-10-CM | POA: Diagnosis not present

## 2023-11-30 DIAGNOSIS — I10 Essential (primary) hypertension: Secondary | ICD-10-CM | POA: Diagnosis not present

## 2023-11-30 DIAGNOSIS — F29 Unspecified psychosis not due to a substance or known physiological condition: Secondary | ICD-10-CM | POA: Diagnosis not present

## 2023-11-30 DIAGNOSIS — F250 Schizoaffective disorder, bipolar type: Secondary | ICD-10-CM | POA: Diagnosis not present

## 2023-11-30 DIAGNOSIS — Z681 Body mass index (BMI) 19 or less, adult: Secondary | ICD-10-CM | POA: Diagnosis not present

## 2023-11-30 DIAGNOSIS — F068 Other specified mental disorders due to known physiological condition: Secondary | ICD-10-CM | POA: Diagnosis not present

## 2023-12-01 DIAGNOSIS — F068 Other specified mental disorders due to known physiological condition: Secondary | ICD-10-CM | POA: Diagnosis not present

## 2023-12-01 DIAGNOSIS — Z681 Body mass index (BMI) 19 or less, adult: Secondary | ICD-10-CM | POA: Diagnosis not present

## 2023-12-01 DIAGNOSIS — F29 Unspecified psychosis not due to a substance or known physiological condition: Secondary | ICD-10-CM | POA: Diagnosis not present

## 2023-12-01 DIAGNOSIS — F250 Schizoaffective disorder, bipolar type: Secondary | ICD-10-CM | POA: Diagnosis not present

## 2023-12-01 DIAGNOSIS — R456 Violent behavior: Secondary | ICD-10-CM | POA: Diagnosis not present

## 2023-12-01 DIAGNOSIS — I10 Essential (primary) hypertension: Secondary | ICD-10-CM | POA: Diagnosis not present

## 2023-12-02 DIAGNOSIS — F29 Unspecified psychosis not due to a substance or known physiological condition: Secondary | ICD-10-CM | POA: Diagnosis not present

## 2023-12-02 DIAGNOSIS — I10 Essential (primary) hypertension: Secondary | ICD-10-CM | POA: Diagnosis not present

## 2023-12-02 DIAGNOSIS — Z681 Body mass index (BMI) 19 or less, adult: Secondary | ICD-10-CM | POA: Diagnosis not present

## 2023-12-02 DIAGNOSIS — F250 Schizoaffective disorder, bipolar type: Secondary | ICD-10-CM | POA: Diagnosis not present

## 2023-12-02 DIAGNOSIS — F068 Other specified mental disorders due to known physiological condition: Secondary | ICD-10-CM | POA: Diagnosis not present

## 2023-12-02 DIAGNOSIS — R456 Violent behavior: Secondary | ICD-10-CM | POA: Diagnosis not present

## 2023-12-03 DIAGNOSIS — Z681 Body mass index (BMI) 19 or less, adult: Secondary | ICD-10-CM | POA: Diagnosis not present

## 2023-12-03 DIAGNOSIS — R456 Violent behavior: Secondary | ICD-10-CM | POA: Diagnosis not present

## 2023-12-03 DIAGNOSIS — F29 Unspecified psychosis not due to a substance or known physiological condition: Secondary | ICD-10-CM | POA: Diagnosis not present

## 2023-12-03 DIAGNOSIS — I10 Essential (primary) hypertension: Secondary | ICD-10-CM | POA: Diagnosis not present

## 2023-12-03 DIAGNOSIS — F250 Schizoaffective disorder, bipolar type: Secondary | ICD-10-CM | POA: Diagnosis not present

## 2023-12-03 DIAGNOSIS — F068 Other specified mental disorders due to known physiological condition: Secondary | ICD-10-CM | POA: Diagnosis not present

## 2023-12-04 DIAGNOSIS — Z681 Body mass index (BMI) 19 or less, adult: Secondary | ICD-10-CM | POA: Diagnosis not present

## 2023-12-04 DIAGNOSIS — I10 Essential (primary) hypertension: Secondary | ICD-10-CM | POA: Diagnosis not present

## 2023-12-04 DIAGNOSIS — F068 Other specified mental disorders due to known physiological condition: Secondary | ICD-10-CM | POA: Diagnosis not present

## 2023-12-04 DIAGNOSIS — F250 Schizoaffective disorder, bipolar type: Secondary | ICD-10-CM | POA: Diagnosis not present

## 2023-12-04 DIAGNOSIS — R456 Violent behavior: Secondary | ICD-10-CM | POA: Diagnosis not present

## 2023-12-04 DIAGNOSIS — F29 Unspecified psychosis not due to a substance or known physiological condition: Secondary | ICD-10-CM | POA: Diagnosis not present

## 2023-12-05 DIAGNOSIS — I10 Essential (primary) hypertension: Secondary | ICD-10-CM | POA: Diagnosis not present

## 2023-12-05 DIAGNOSIS — R456 Violent behavior: Secondary | ICD-10-CM | POA: Diagnosis not present

## 2023-12-05 DIAGNOSIS — F29 Unspecified psychosis not due to a substance or known physiological condition: Secondary | ICD-10-CM | POA: Diagnosis not present

## 2023-12-05 DIAGNOSIS — F250 Schizoaffective disorder, bipolar type: Secondary | ICD-10-CM | POA: Diagnosis not present

## 2023-12-05 DIAGNOSIS — Z681 Body mass index (BMI) 19 or less, adult: Secondary | ICD-10-CM | POA: Diagnosis not present

## 2023-12-05 DIAGNOSIS — F068 Other specified mental disorders due to known physiological condition: Secondary | ICD-10-CM | POA: Diagnosis not present

## 2023-12-06 DIAGNOSIS — F068 Other specified mental disorders due to known physiological condition: Secondary | ICD-10-CM | POA: Diagnosis not present

## 2023-12-06 DIAGNOSIS — F29 Unspecified psychosis not due to a substance or known physiological condition: Secondary | ICD-10-CM | POA: Diagnosis not present

## 2023-12-06 DIAGNOSIS — R456 Violent behavior: Secondary | ICD-10-CM | POA: Diagnosis not present

## 2023-12-06 DIAGNOSIS — I10 Essential (primary) hypertension: Secondary | ICD-10-CM | POA: Diagnosis not present

## 2023-12-06 DIAGNOSIS — Z681 Body mass index (BMI) 19 or less, adult: Secondary | ICD-10-CM | POA: Diagnosis not present

## 2023-12-06 DIAGNOSIS — F250 Schizoaffective disorder, bipolar type: Secondary | ICD-10-CM | POA: Diagnosis not present

## 2023-12-07 DIAGNOSIS — I10 Essential (primary) hypertension: Secondary | ICD-10-CM | POA: Diagnosis not present

## 2023-12-07 DIAGNOSIS — F250 Schizoaffective disorder, bipolar type: Secondary | ICD-10-CM | POA: Diagnosis not present

## 2023-12-07 DIAGNOSIS — F29 Unspecified psychosis not due to a substance or known physiological condition: Secondary | ICD-10-CM | POA: Diagnosis not present

## 2023-12-07 DIAGNOSIS — R456 Violent behavior: Secondary | ICD-10-CM | POA: Diagnosis not present

## 2023-12-07 DIAGNOSIS — Z681 Body mass index (BMI) 19 or less, adult: Secondary | ICD-10-CM | POA: Diagnosis not present

## 2023-12-07 DIAGNOSIS — F068 Other specified mental disorders due to known physiological condition: Secondary | ICD-10-CM | POA: Diagnosis not present

## 2023-12-08 DIAGNOSIS — F250 Schizoaffective disorder, bipolar type: Secondary | ICD-10-CM | POA: Diagnosis not present

## 2023-12-08 DIAGNOSIS — F29 Unspecified psychosis not due to a substance or known physiological condition: Secondary | ICD-10-CM | POA: Diagnosis not present

## 2023-12-08 DIAGNOSIS — I10 Essential (primary) hypertension: Secondary | ICD-10-CM | POA: Diagnosis not present

## 2023-12-08 DIAGNOSIS — F068 Other specified mental disorders due to known physiological condition: Secondary | ICD-10-CM | POA: Diagnosis not present

## 2023-12-08 DIAGNOSIS — Z681 Body mass index (BMI) 19 or less, adult: Secondary | ICD-10-CM | POA: Diagnosis not present

## 2023-12-08 DIAGNOSIS — R456 Violent behavior: Secondary | ICD-10-CM | POA: Diagnosis not present

## 2023-12-09 DIAGNOSIS — Z681 Body mass index (BMI) 19 or less, adult: Secondary | ICD-10-CM | POA: Diagnosis not present

## 2023-12-09 DIAGNOSIS — F068 Other specified mental disorders due to known physiological condition: Secondary | ICD-10-CM | POA: Diagnosis not present

## 2023-12-09 DIAGNOSIS — I10 Essential (primary) hypertension: Secondary | ICD-10-CM | POA: Diagnosis not present

## 2023-12-09 DIAGNOSIS — F250 Schizoaffective disorder, bipolar type: Secondary | ICD-10-CM | POA: Diagnosis not present

## 2023-12-09 DIAGNOSIS — F29 Unspecified psychosis not due to a substance or known physiological condition: Secondary | ICD-10-CM | POA: Diagnosis not present

## 2023-12-09 DIAGNOSIS — R456 Violent behavior: Secondary | ICD-10-CM | POA: Diagnosis not present

## 2023-12-10 DIAGNOSIS — F29 Unspecified psychosis not due to a substance or known physiological condition: Secondary | ICD-10-CM | POA: Diagnosis not present

## 2023-12-10 DIAGNOSIS — I10 Essential (primary) hypertension: Secondary | ICD-10-CM | POA: Diagnosis not present

## 2023-12-10 DIAGNOSIS — R456 Violent behavior: Secondary | ICD-10-CM | POA: Diagnosis not present

## 2023-12-10 DIAGNOSIS — F250 Schizoaffective disorder, bipolar type: Secondary | ICD-10-CM | POA: Diagnosis not present

## 2023-12-10 DIAGNOSIS — F068 Other specified mental disorders due to known physiological condition: Secondary | ICD-10-CM | POA: Diagnosis not present

## 2023-12-10 DIAGNOSIS — Z681 Body mass index (BMI) 19 or less, adult: Secondary | ICD-10-CM | POA: Diagnosis not present

## 2023-12-11 DIAGNOSIS — Z681 Body mass index (BMI) 19 or less, adult: Secondary | ICD-10-CM | POA: Diagnosis not present

## 2023-12-11 DIAGNOSIS — I10 Essential (primary) hypertension: Secondary | ICD-10-CM | POA: Diagnosis not present

## 2023-12-11 DIAGNOSIS — F250 Schizoaffective disorder, bipolar type: Secondary | ICD-10-CM | POA: Diagnosis not present

## 2023-12-11 DIAGNOSIS — F068 Other specified mental disorders due to known physiological condition: Secondary | ICD-10-CM | POA: Diagnosis not present

## 2023-12-11 DIAGNOSIS — F29 Unspecified psychosis not due to a substance or known physiological condition: Secondary | ICD-10-CM | POA: Diagnosis not present

## 2023-12-11 DIAGNOSIS — R456 Violent behavior: Secondary | ICD-10-CM | POA: Diagnosis not present

## 2023-12-13 DIAGNOSIS — F29 Unspecified psychosis not due to a substance or known physiological condition: Secondary | ICD-10-CM | POA: Diagnosis not present

## 2023-12-13 DIAGNOSIS — F250 Schizoaffective disorder, bipolar type: Secondary | ICD-10-CM | POA: Diagnosis not present

## 2023-12-13 DIAGNOSIS — F068 Other specified mental disorders due to known physiological condition: Secondary | ICD-10-CM | POA: Diagnosis not present

## 2023-12-13 DIAGNOSIS — Z681 Body mass index (BMI) 19 or less, adult: Secondary | ICD-10-CM | POA: Diagnosis not present

## 2023-12-13 DIAGNOSIS — I10 Essential (primary) hypertension: Secondary | ICD-10-CM | POA: Diagnosis not present

## 2023-12-13 DIAGNOSIS — R456 Violent behavior: Secondary | ICD-10-CM | POA: Diagnosis not present

## 2023-12-14 DIAGNOSIS — F250 Schizoaffective disorder, bipolar type: Secondary | ICD-10-CM | POA: Diagnosis not present

## 2023-12-14 DIAGNOSIS — R456 Violent behavior: Secondary | ICD-10-CM | POA: Diagnosis not present

## 2023-12-14 DIAGNOSIS — I10 Essential (primary) hypertension: Secondary | ICD-10-CM | POA: Diagnosis not present

## 2023-12-14 DIAGNOSIS — F068 Other specified mental disorders due to known physiological condition: Secondary | ICD-10-CM | POA: Diagnosis not present

## 2023-12-14 DIAGNOSIS — Z681 Body mass index (BMI) 19 or less, adult: Secondary | ICD-10-CM | POA: Diagnosis not present

## 2023-12-14 DIAGNOSIS — F29 Unspecified psychosis not due to a substance or known physiological condition: Secondary | ICD-10-CM | POA: Diagnosis not present

## 2023-12-15 DIAGNOSIS — Z681 Body mass index (BMI) 19 or less, adult: Secondary | ICD-10-CM | POA: Diagnosis not present

## 2023-12-15 DIAGNOSIS — F29 Unspecified psychosis not due to a substance or known physiological condition: Secondary | ICD-10-CM | POA: Diagnosis not present

## 2023-12-15 DIAGNOSIS — F250 Schizoaffective disorder, bipolar type: Secondary | ICD-10-CM | POA: Diagnosis not present

## 2023-12-15 DIAGNOSIS — R456 Violent behavior: Secondary | ICD-10-CM | POA: Diagnosis not present

## 2023-12-15 DIAGNOSIS — I10 Essential (primary) hypertension: Secondary | ICD-10-CM | POA: Diagnosis not present

## 2023-12-15 DIAGNOSIS — F068 Other specified mental disorders due to known physiological condition: Secondary | ICD-10-CM | POA: Diagnosis not present

## 2023-12-16 DIAGNOSIS — I10 Essential (primary) hypertension: Secondary | ICD-10-CM | POA: Diagnosis not present

## 2023-12-16 DIAGNOSIS — R456 Violent behavior: Secondary | ICD-10-CM | POA: Diagnosis not present

## 2023-12-16 DIAGNOSIS — F250 Schizoaffective disorder, bipolar type: Secondary | ICD-10-CM | POA: Diagnosis not present

## 2023-12-16 DIAGNOSIS — F068 Other specified mental disorders due to known physiological condition: Secondary | ICD-10-CM | POA: Diagnosis not present

## 2023-12-16 DIAGNOSIS — Z681 Body mass index (BMI) 19 or less, adult: Secondary | ICD-10-CM | POA: Diagnosis not present

## 2023-12-16 DIAGNOSIS — F29 Unspecified psychosis not due to a substance or known physiological condition: Secondary | ICD-10-CM | POA: Diagnosis not present

## 2023-12-17 DIAGNOSIS — F068 Other specified mental disorders due to known physiological condition: Secondary | ICD-10-CM | POA: Diagnosis not present

## 2023-12-17 DIAGNOSIS — Z681 Body mass index (BMI) 19 or less, adult: Secondary | ICD-10-CM | POA: Diagnosis not present

## 2023-12-17 DIAGNOSIS — I10 Essential (primary) hypertension: Secondary | ICD-10-CM | POA: Diagnosis not present

## 2023-12-17 DIAGNOSIS — R456 Violent behavior: Secondary | ICD-10-CM | POA: Diagnosis not present

## 2023-12-17 DIAGNOSIS — F29 Unspecified psychosis not due to a substance or known physiological condition: Secondary | ICD-10-CM | POA: Diagnosis not present

## 2023-12-17 DIAGNOSIS — F250 Schizoaffective disorder, bipolar type: Secondary | ICD-10-CM | POA: Diagnosis not present

## 2023-12-18 DIAGNOSIS — Z681 Body mass index (BMI) 19 or less, adult: Secondary | ICD-10-CM | POA: Diagnosis not present

## 2023-12-18 DIAGNOSIS — F29 Unspecified psychosis not due to a substance or known physiological condition: Secondary | ICD-10-CM | POA: Diagnosis not present

## 2023-12-18 DIAGNOSIS — F068 Other specified mental disorders due to known physiological condition: Secondary | ICD-10-CM | POA: Diagnosis not present

## 2023-12-18 DIAGNOSIS — I10 Essential (primary) hypertension: Secondary | ICD-10-CM | POA: Diagnosis not present

## 2023-12-18 DIAGNOSIS — F250 Schizoaffective disorder, bipolar type: Secondary | ICD-10-CM | POA: Diagnosis not present

## 2023-12-18 DIAGNOSIS — R456 Violent behavior: Secondary | ICD-10-CM | POA: Diagnosis not present

## 2023-12-20 DIAGNOSIS — F29 Unspecified psychosis not due to a substance or known physiological condition: Secondary | ICD-10-CM | POA: Diagnosis not present

## 2023-12-20 DIAGNOSIS — F250 Schizoaffective disorder, bipolar type: Secondary | ICD-10-CM | POA: Diagnosis not present

## 2023-12-20 DIAGNOSIS — R456 Violent behavior: Secondary | ICD-10-CM | POA: Diagnosis not present

## 2023-12-20 DIAGNOSIS — F068 Other specified mental disorders due to known physiological condition: Secondary | ICD-10-CM | POA: Diagnosis not present

## 2023-12-20 DIAGNOSIS — I10 Essential (primary) hypertension: Secondary | ICD-10-CM | POA: Diagnosis not present

## 2023-12-27 DIAGNOSIS — F250 Schizoaffective disorder, bipolar type: Secondary | ICD-10-CM | POA: Diagnosis not present

## 2024-01-25 DIAGNOSIS — F250 Schizoaffective disorder, bipolar type: Secondary | ICD-10-CM | POA: Diagnosis not present

## 2024-02-24 DIAGNOSIS — F250 Schizoaffective disorder, bipolar type: Secondary | ICD-10-CM | POA: Diagnosis not present

## 2024-03-23 DIAGNOSIS — F250 Schizoaffective disorder, bipolar type: Secondary | ICD-10-CM | POA: Diagnosis not present

## 2024-05-09 DIAGNOSIS — F250 Schizoaffective disorder, bipolar type: Secondary | ICD-10-CM | POA: Diagnosis not present

## 2024-05-11 DIAGNOSIS — F250 Schizoaffective disorder, bipolar type: Secondary | ICD-10-CM | POA: Diagnosis not present

## 2024-06-07 ENCOUNTER — Ambulatory Visit: Admitting: Family Medicine

## 2024-06-07 ENCOUNTER — Ambulatory Visit: Payer: Self-pay | Admitting: Family Medicine

## 2024-06-07 ENCOUNTER — Encounter: Payer: Self-pay | Admitting: Family Medicine

## 2024-06-07 VITALS — BP 154/98 | HR 88 | Ht 65.0 in | Wt 287.0 lb

## 2024-06-07 DIAGNOSIS — K439 Ventral hernia without obstruction or gangrene: Secondary | ICD-10-CM

## 2024-06-07 DIAGNOSIS — Z1231 Encounter for screening mammogram for malignant neoplasm of breast: Secondary | ICD-10-CM | POA: Diagnosis not present

## 2024-06-07 DIAGNOSIS — R300 Dysuria: Secondary | ICD-10-CM | POA: Diagnosis not present

## 2024-06-07 DIAGNOSIS — E871 Hypo-osmolality and hyponatremia: Secondary | ICD-10-CM | POA: Diagnosis not present

## 2024-06-07 DIAGNOSIS — N179 Acute kidney failure, unspecified: Secondary | ICD-10-CM

## 2024-06-07 DIAGNOSIS — R251 Tremor, unspecified: Secondary | ICD-10-CM | POA: Diagnosis not present

## 2024-06-07 DIAGNOSIS — Z23 Encounter for immunization: Secondary | ICD-10-CM | POA: Diagnosis not present

## 2024-06-07 DIAGNOSIS — Z1159 Encounter for screening for other viral diseases: Secondary | ICD-10-CM

## 2024-06-07 DIAGNOSIS — Z1211 Encounter for screening for malignant neoplasm of colon: Secondary | ICD-10-CM | POA: Diagnosis not present

## 2024-06-07 LAB — POCT UA - MICROSCOPIC ONLY: WBC, Ur, HPF, POC: NONE SEEN (ref 0–5)

## 2024-06-07 LAB — POCT URINALYSIS DIP (MANUAL ENTRY)
Bilirubin, UA: NEGATIVE
Glucose, UA: NEGATIVE mg/dL
Ketones, POC UA: NEGATIVE mg/dL
Leukocytes, UA: NEGATIVE
Nitrite, UA: NEGATIVE
Protein Ur, POC: NEGATIVE mg/dL
Spec Grav, UA: <=1.005 — AB (ref 1.010–1.025)
Urobilinogen, UA: 0.2 U/dL
pH, UA: 6 (ref 5.0–8.0)

## 2024-06-07 NOTE — Patient Instructions (Addendum)
 Thank you for coming in today! Here is a summary of what we discussed:  - I sent a referral to general surgery to evaluate for a hernia.  You should hear from them in the next 1 to 2 weeks.  Let us  know if you do not and we can check  - I also sent a referral for a colonoscopy, you should hear from the GI doctors to schedule this.  -You need a mammogram to prevent breast cancer.  Please schedule an appointment.  You can call [phone].    - We can discuss doing a Pap smear next time  - You can also ask the pharmacy about the shingles and pneumonia vaccines  -Please schedule a follow-up with me in the next 2-4 weeks   We are checking some labs today. If they are abnormal, I will call you. If they are normal, I will send you a MyChart message (if it is active) or a letter in the mail. If you do not hear about your labs in the next 2 weeks, please call the office.   Please call the clinic at [phone] if your symptoms worsen or you have any concerns.  Best, Dr Adele

## 2024-06-07 NOTE — Progress Notes (Signed)
 Subjective:  Patient ID: Sally Wang, female    DOB: 11-01-1960, 63 y.o.   MRN: [id number]  CC: New Patient  HPI:  Sally Wang is a very pleasant 63 y.o. female who presents today to establish care.  Patient reports frequent urination and abdominal discomfort for 2 months, denies dysuria.  Also reports low back pain.  Thinks that she may have a hernia as well  Additionally, states she has had leg swelling for a while.  Denies history of cardiac problems.  Notes hand tremor for about 2 weeks.    Gets Haldol  injections from [facility], has concerns about this   PMHx: Past Medical History:  Diagnosis Date   Hypertension    Schizoaffective disorder [facility])     Surgical Hx: Past Surgical History:  Procedure Laterality Date   CHOLECYSTECTOMY  2006   UMBILICAL HERNIA REPAIR N/A 02/29/2020   Procedure: HERNIA REPAIR UMBILICAL ADULT;  Surgeon: Vernetta Berg, MD;  Location: [facility];  Service: General;  Laterality: N/A;    Family Hx: Family History  Problem Relation Age of Onset   Coronary artery disease Father        died 83 yo   Diabetes Mother        died 65   Hypertension Brother     Social Hx: Current Social History   Who lives at home: Lives with husband Sally Wang, daughter, granddaughter 06/07/2024  Who would speak for you about health care matters: Sally Wang 06/07/2024  Transportation: Husband and daughter 06/07/2024 Current Stressors: none 06/07/2024 Work / Education:  no, 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 Nothing else daily  Preventative Screening Colonoscopy: never done, ordered today Mammogram: Unsure of last time this was done, states results have been normal.  Ordered today Pap test: Think she had this the last time she was here- 8 years ago?  Will discuss at next appointment DEXA: Never done, can discuss  at future appointment Tetanus vaccine: Unsure Pneumonia vaccine: Not done Shingles vaccine: Not done  Smoking status reviewed - never smoker No EtOH currently  ROS: pertinent noted in the HPI   Objective:  BP (!) 154/98   Pulse 88   Ht 5' 5 (1.651 m)   Wt 287 lb (130.2 kg)   LMP 02/16/2015   SpO2 98%   BMI 47.76 kg/m  Vitals and nursing note reviewed  General: NAD, pleasant, able to participate in exam HEENT: normocephalic, no scleral icterus or conjunctival pallor, no nasal discharge, moist mucous membranes Cardiac: RRR, S1 S2 present. normal heart sounds, no murmurs. Respiratory: CTAB, normal effort, No wheezes, rales or rhonchi Abdomen: Normoactive bowel sounds, mildly tender, firm area at RLQ/periumbilical Extremities: BLE edema Skin: warm and dry Neuro: Upper extremity tremor, speech somewhat difficult to understand Psych: Normal affect and mood  Assessment & Plan:   1. Dysuria Reports urinary frequency for several months.  Will check for UTI - POCT urinalysis dipstick - CBC with Differential - POCT UA - Microscopic Only  2. Hyponatremia History of this, will check basic labs today - Basic Metabolic Panel  4. Encounter for screening mammogram for malignant neoplasm of breast - MM 3D SCREENING MAMMOGRAM BILATERAL BREAST; Future  5. Screen for colon cancer - Ambulatory referral to Gastroenterology  6. Ventral hernia without obstruction or gangrene Patient with history of umbilical hernia, has bothersome firm mass in abdomen.  Will send referral to general surgery to reassess - Ambulatory referral to General Surgery  7. Encounter for HCV screening test for low risk patient Due for this - Hepatitis C antibody  8. Tremor Patient states this has been present for 2 weeks.  May be EPS from Haldol  injections.  Instructed her to follow-up with [facility] about this.  Can consider referral to neurology in the future.  Advised close follow-up with me.  No follow-ups  on file. Rea Raring, MD 06/07/2024, 10:48 AM PGY-2, [facility]

## 2024-06-08 DIAGNOSIS — F250 Schizoaffective disorder, bipolar type: Secondary | ICD-10-CM | POA: Diagnosis not present

## 2024-06-08 LAB — BASIC METABOLIC PANEL WITH GFR
BUN/Creatinine Ratio: 13 (ref 12–28)
BUN: 13 mg/dL (ref 8–27)
CO2: 23 mmol/L (ref 20–29)
Calcium: 9.7 mg/dL (ref 8.7–10.3)
Chloride: 99 mmol/L (ref 96–106)
Creatinine, Ser: 0.97 mg/dL (ref 0.57–1.00)
Glucose: 86 mg/dL (ref 70–99)
Potassium: 4.5 mmol/L (ref 3.5–5.2)
Sodium: 140 mmol/L (ref 134–144)
eGFR: 66 mL/min/1.73 (ref >59)

## 2024-06-08 LAB — CBC WITH DIFFERENTIAL/PLATELET
Basophils Absolute: 0 x10E3/uL (ref 0.0–0.2)
Basos: 0 %
EOS (ABSOLUTE): 0.1 x10E3/uL (ref 0.0–0.4)
Eos: 3 %
Hematocrit: 43.2 % (ref 34.0–46.6)
Hemoglobin: 13.8 g/dL (ref 11.1–15.9)
Immature Grans (Abs): 0 x10E3/uL (ref 0.0–0.1)
Immature Granulocytes: 0 %
Lymphocytes Absolute: 1.7 x10E3/uL (ref 0.7–3.1)
Lymphs: 34 %
MCH: 29.6 pg (ref 26.6–33.0)
MCHC: 31.9 g/dL (ref 31.5–35.7)
MCV: 93 fL (ref 79–97)
Monocytes Absolute: 0.4 x10E3/uL (ref 0.1–0.9)
Monocytes: 7 %
Neutrophils Absolute: 2.8 x10E3/uL (ref 1.4–7.0)
Neutrophils: 56 %
Platelets: 256 x10E3/uL (ref 150–450)
RBC: 4.66 x10E6/uL (ref 3.77–5.28)
RDW: 12.6 % (ref 11.7–15.4)
WBC: 5.1 x10E3/uL (ref 3.4–10.8)

## 2024-06-08 LAB — HEPATITIS C ANTIBODY: Hep C Virus Ab: NONREACTIVE

## 2024-06-14 ENCOUNTER — Ambulatory Visit: Admitting: Student

## 2024-06-16 ENCOUNTER — Encounter: Payer: Self-pay | Admitting: Student

## 2024-06-16 ENCOUNTER — Ambulatory Visit (INDEPENDENT_AMBULATORY_CARE_PROVIDER_SITE_OTHER): Admitting: Student

## 2024-06-16 VITALS — BP 191/114 | HR 99 | Ht 65.0 in | Wt 284.6 lb

## 2024-06-16 DIAGNOSIS — R3589 Other polyuria: Secondary | ICD-10-CM

## 2024-06-16 DIAGNOSIS — I10 Essential (primary) hypertension: Secondary | ICD-10-CM

## 2024-06-16 DIAGNOSIS — R251 Tremor, unspecified: Secondary | ICD-10-CM

## 2024-06-16 LAB — POCT GLYCOSYLATED HEMOGLOBIN (HGB A1C): Hemoglobin A1C: 5.9 % — AB (ref 4.0–5.6)

## 2024-06-16 MED ORDER — LOSARTAN POTASSIUM 50 MG PO TABS: 50.0000 mg | ORAL_TABLET | Freq: Every day | ORAL | 3 refills | Status: DC

## 2024-06-16 NOTE — Patient Instructions (Addendum)
 Pleasure to see you today.  Today we ordered some labs including your thyroid lab and A1c to check for diabetes to explore why you are having excessive urinary frequency.  Your last test for UTI was negative your electrolytes were all normal.  I also recommend cutting down on caffeine usage including soda as this can contribute to increased urination.  Also hold off on the supplements that you are currently taking to see if this improves your excessive urination.  For your tremor I have placed referral to see a neurologist for further evaluation.  Your blood pressure today is elevated.  I have started you on losartan which you can take once daily.

## 2024-06-16 NOTE — Progress Notes (Addendum)
    SUBJECTIVE:   CHIEF COMPLAINT / HPI:   63 year old female with history of schizoaffective disorder Presenting today for increased urinary frequency Per patient this has been going on for about a month Denies any dysuria or blood in the urine. Does endorse increased thirst as well and increase in oral fluid intake Stated that she does drink soda She does drink sodaShe drinks soda, mostly coca cola and coffee. She's unsure of what medications she is taking  but daughter reports she take Depakote , Haldol  and PRN hydroxyzine managed by her psychiatrist. In addition patient reports taking a few supplements although unable to identify these supplements   Patient follows with a psychiatrist for mood management There is some confusions as to whether she is taking her Depakote  and when if her tremor started before or after initiation of the Depakote  for mood stabilizer. She's also gotten Haldol  injections.  PERTINENT  PMH / PSH: Reviewed   OBJECTIVE:   BP (!) 191/114   Pulse 99   Ht 5' 5 (1.651 m)   Wt 284 lb 9.6 oz (129.1 kg)   LMP 02/16/2015   SpO2 100%   BMI 47.36 kg/m    Physical Exam General: Alert, visible intermittent resting tremor, NAD Cardiovascular: RRR, No Murmurs, Normal S2/S2 Respiratory: CTAB, No wheezing or Rales Abdomen: No distension or tenderness Extremities: contracture of the hands bilaterally, +1 BLE pitting edema Skin: Warm and dry  ASSESSMENT/PLAN:   Uncontrolled hypertension BP elevated x2 today. Not on any current med and asymptomatic. Chart review showed multiple attempt to get patient on BP medication  but issue with compliance. -Rx Losartan 50mg  daily -Encourage to keep a daily BP log  -Follow up in 3 weeks    Polyuria Suspect psychogenic in patient with diagnosed history of schizoaffective disorder. Also endorse frequent consumption of caffeine which could be contributory. Recently obtained lab including BMP and UA showed stable electrolyte  levels. A1c obtained today was 5.9%. Patient also endorsing taking two supplement which she couldn't name -Advised reducing caffeine intake -Provided reassurance to patient  -Ordered lab for A1c, TSH -Advised patient to hold her supplements for few weeks to see if there is symptom relieve    Tremors Visible tremors reported to be present within the last year. Inconsistent report on whether patient is currently taking Depakote  as a mood stabilizer, could be SE but unclear what patient is currently taking. -Placed referral to neurology -Patient encouraged to discuss tremor with psychiatrist managing her medication including Depakote      Norleen April, MD [facility]

## 2024-06-16 NOTE — Assessment & Plan Note (Signed)
 BP elevated x2 today. Not on any current med and asymptomatic. Chart review showed multiple attempt to get patient on BP medication  but issue with compliance. -Rx Losartan 50mg  daily -Encourage to keep a daily BP log  -Follow up in 3 weeks

## 2024-06-17 LAB — TSH: TSH: 1.99 u[IU]/mL (ref 0.450–4.500)

## 2024-07-04 ENCOUNTER — Ambulatory Visit (INDEPENDENT_AMBULATORY_CARE_PROVIDER_SITE_OTHER): Admitting: Family Medicine

## 2024-07-04 ENCOUNTER — Ambulatory Visit: Admitting: Family Medicine

## 2024-07-04 ENCOUNTER — Ambulatory Visit: Admission: RE | Admit: 2024-07-04 | Discharge: 2024-07-04 | Disposition: A | Source: Ambulatory Visit

## 2024-07-04 VITALS — BP 176/104 | HR 98 | Ht 65.0 in | Wt 292.8 lb

## 2024-07-04 DIAGNOSIS — I10 Essential (primary) hypertension: Secondary | ICD-10-CM

## 2024-07-04 DIAGNOSIS — R3589 Other polyuria: Secondary | ICD-10-CM | POA: Diagnosis not present

## 2024-07-04 DIAGNOSIS — Z0000 Encounter for general adult medical examination without abnormal findings: Secondary | ICD-10-CM | POA: Diagnosis not present

## 2024-07-04 DIAGNOSIS — R251 Tremor, unspecified: Secondary | ICD-10-CM | POA: Diagnosis not present

## 2024-07-04 DIAGNOSIS — Z1231 Encounter for screening mammogram for malignant neoplasm of breast: Secondary | ICD-10-CM

## 2024-07-04 MED ORDER — AMLODIPINE BESYLATE-VALSARTAN 5-320 MG PO TABS: 1.0000 | ORAL_TABLET | Freq: Every day | ORAL | 0 refills | Status: DC

## 2024-07-04 MED ORDER — ADULT BLOOD PRESSURE CUFF LG KIT: PACK | 0 refills | Status: DC

## 2024-07-04 NOTE — Progress Notes (Signed)
    SUBJECTIVE:   CHIEF COMPLAINT / HPI:   --Per last [facility] visit note from Dr. Rosendo 10/3: --elevated blood pressure to 190/114- was started on losartan 50 mg daily -- Polyuria: Normal A1c, TSH, electrolytes recently.  Advised reducing caffeine intake.  Taking some supplement, confirm her name, advised to stop taking them.  May be psychogenic in etiology? -- Tremors: Unclear if taking Depakote , referred to neurology and advise discussing with psychiatrist  HTN --has been taking losartan daily --does not have a BP cuff at home --no CP, SOB, headache, blurred vision --took amlodipine  in the past  Polyuria --thinks it is better for the last 2-3 days --states is occurs more at night vs day --does not take any diuretics --menopause around 50  Tremors --takes Depakote  daily PRN, last dose yesterday. Unsure of dose. Has appt with psychiatrist tomorrow to discuss --has not heard from neuro yet, referral pending  PERTINENT  PMH / PSH: HTN, schizoaffective disorder, delusional disorder  OBJECTIVE:   BP (!) 176/104   Pulse 98   Ht 5' 5 (1.651 m)   Wt 292 lb 12.8 oz (132.8 kg)   LMP 02/16/2015   SpO2 97%   BMI 48.72 kg/m   - General: No acute distress. Awake and conversant. - Eyes: Normal conjunctiva, anicteric. Round symmetric pupils. - ENT: Hearing grossly intact. No nasal discharge. - Neck: Neck is supple. No masses or thyromegaly. - Respiratory: Respirations are non-labored. - Skin: No visible rashes or ulcers. - Psych: Alert and oriented. Cooperative, Appropriate mood and affect - MSK: normal ambulation - Neuro: Sensation and CN II-XII grossly normal.   ASSESSMENT/PLAN:   Assessment & Plan Uncontrolled hypertension Blood pressure remains elevated after a few weeks on losartan 50mg .  No signs of end organ damage today.  Will collect BMP today.  Will switch to combination amlodipine  valsartan today.  Ordered BP monitor for home reading.  Advised to follow-up in 2 weeks  to continue to titrate blood pressure medications.  Can consider appointment with [facility] pharmacist moving forward if needed. - Basic metabolic panel with GFR - Blood Pressure Monitoring (ADULT BLOOD PRESSURE CUFF LG) KIT; Please monitor your blood pressure daily and keep a log to bring to your appointments  Dispense: 1 kit; Refill: 0 - amLODipine -valsartan (EXFORGE) 5-320 MG tablet; Take 1 tablet by mouth daily.  Dispense: 30 tablet; Refill: 0  Tremor Continues to have resting tremor.  Will check on neuro referral, pending review as of 10/3.  Also advised patient to discuss symptoms with her psychiatrist, has an appointment tomorrow morning. Polyuria Patient reports reports improvement in symptoms.  Workup unrevealing so far as above.  Advised following up with psychiatrist as above to see if medication could be contributing. Healthcare maintenance Sent referral to GI for colonoscopy at last appointment.  Referral has been authorized, instructed patient to wait to hear from them or call office if desired. Patient due for Pap, can do this at next appointment. Advised shingles and pneumococcal vaccines, can get these at the pharmacy    Rea Raring, MD [facility]

## 2024-07-04 NOTE — Patient Instructions (Addendum)
 Thank you for coming in today! Here is a summary of what we discussed:  -I sent in an order for a blood pressure cuff, you will likely just need to purchase one over the counter. You can find one at any pharmacy or store (or [place]). Make sure to get one that fits on your upper arm.   -Ask your psychiatrist if they think any of your medicines are contributing to your frequent urination or tremor. Also ask about the Depakote  medication or another medication for anxiety. Please ask them to send your most recent records to us  to have on file as well if they can.  -I sent a referral to GI for a colonoscopy- they should be contacting you to schedule this. Their phone number is : [phone]   -You can get the Shingrix vaccine at the pharmacy. You will need a 2nd shot 2-6 months after the first. This vaccine is important to help prevent shingles.    -You can also ask about the pneumococcal vaccine at the pharmacy  We are checking some labs today. If they are abnormal, I will call you. If they are normal, I will send you a MyChart message (if it is active) or a letter in the mail. If you do not hear about your labs in the next 2 weeks, please call the office.   Please follow up with  me in 2 weeks so we can keep checking your blood pressure and do a pap.  Please call the clinic at [phone] if your symptoms worsen or you have any concerns.  Best, Dr Adele

## 2024-07-04 NOTE — Assessment & Plan Note (Signed)
 Blood pressure remains elevated after a few weeks on losartan 50mg .  No signs of end organ damage today.  Will collect BMP today.  Will switch to combination amlodipine  valsartan today.  Ordered BP monitor for home reading.  Advised to follow-up in 2 weeks to continue to titrate blood pressure medications.  Can consider appointment with [facility] pharmacist moving forward if needed. - Basic metabolic panel with GFR - Blood Pressure Monitoring (ADULT BLOOD PRESSURE CUFF LG) KIT; Please monitor your blood pressure daily and keep a log to bring to your appointments  Dispense: 1 kit; Refill: 0 - amLODipine -valsartan (EXFORGE) 5-320 MG tablet; Take 1 tablet by mouth daily.  Dispense: 30 tablet; Refill: 0

## 2024-07-04 NOTE — Progress Notes (Deleted)
    SUBJECTIVE:   CHIEF COMPLAINT / HPI:   Follow-up -- Saw Dr. Rosendo 10/3 --elevated blood pressure to 190/114- was started on losartan 50 mg daily -- Polyuria: Normal A1c, TSH, electrolytes recently.  Advised reducing caffeine intake.  Taking some supplement, confirm her name, advised to stop taking them.  May be psychogenic in etiology? -- Tremors: Unclear if taking Depakote , referred to neurology and advise discussing with psychiatrist   PERTINENT  PMH / PSH: ***  OBJECTIVE:   LMP 02/16/2015   ***  ASSESSMENT/PLAN:   Assessment & Plan      Sally Raring, MD [facility]

## 2024-07-05 ENCOUNTER — Telehealth: Payer: Self-pay

## 2024-07-05 ENCOUNTER — Ambulatory Visit: Payer: Self-pay | Admitting: Family Medicine

## 2024-07-05 LAB — BASIC METABOLIC PANEL WITH GFR
BUN/Creatinine Ratio: 13 (ref 12–28)
BUN: 12 mg/dL (ref 8–27)
CO2: 27 mmol/L (ref 20–29)
Calcium: 9.7 mg/dL (ref 8.7–10.3)
Chloride: 100 mmol/L (ref 96–106)
Creatinine, Ser: 0.89 mg/dL (ref 0.57–1.00)
Glucose: 92 mg/dL (ref 70–99)
Potassium: 4.4 mmol/L (ref 3.5–5.2)
Sodium: 140 mmol/L (ref 134–144)
eGFR: 73 mL/min/1.73 (ref >59)

## 2024-07-05 NOTE — Telephone Encounter (Signed)
 Patient calls nurse line asking if Dr. Adele can prescribe her something for pain. She reports lower back pain for the last 3-4 months.   She has been treating with Extra Strength Tylenol  every 6 hours. She reports using pain relief patch today that has helped slightly.   Patient reports that she forgot to ask during yesterday's visit. Advised that she may need to discuss this further with Dr. Adele.   Patient returns call to nurse line. She is going to continue with tylenol  and patches and discuss at next visit.   Chiquita JAYSON English, RN

## 2024-07-13 ENCOUNTER — Encounter: Payer: Self-pay | Admitting: Neurology

## 2024-07-13 DIAGNOSIS — F250 Schizoaffective disorder, bipolar type: Secondary | ICD-10-CM | POA: Diagnosis not present

## 2024-07-18 ENCOUNTER — Ambulatory Visit (INDEPENDENT_AMBULATORY_CARE_PROVIDER_SITE_OTHER): Admitting: Family Medicine

## 2024-07-18 ENCOUNTER — Other Ambulatory Visit (HOSPITAL_COMMUNITY)
Admission: RE | Admit: 2024-07-18 | Discharge: 2024-07-18 | Disposition: A | Discharge status: Home / Self Care | Source: Ambulatory Visit | Attending: Family Medicine | Admitting: Family Medicine

## 2024-07-18 VITALS — BP 149/104 | HR 95 | Wt 284.2 lb

## 2024-07-18 DIAGNOSIS — Z124 Encounter for screening for malignant neoplasm of cervix: Secondary | ICD-10-CM

## 2024-07-18 DIAGNOSIS — Z1151 Encounter for screening for human papillomavirus (HPV): Secondary | ICD-10-CM | POA: Diagnosis not present

## 2024-07-18 DIAGNOSIS — Z01419 Encounter for gynecological examination (general) (routine) without abnormal findings: Secondary | ICD-10-CM | POA: Diagnosis present

## 2024-07-18 DIAGNOSIS — I10 Essential (primary) hypertension: Secondary | ICD-10-CM

## 2024-07-18 DIAGNOSIS — L304 Erythema intertrigo: Secondary | ICD-10-CM

## 2024-07-18 MED ORDER — NYSTATIN 100000 UNIT/GM EX POWD: 1.0000 | Freq: Three times a day (TID) | CUTANEOUS | 0 refills | Status: DC

## 2024-07-18 NOTE — Patient Instructions (Addendum)
 Thank you for coming in today! Here is a summary of what we discussed:  Please schedule a follow up appt with me in about 2 weeks. Please continue taking your blood pressure medicine daily and checking your blood pressure at home. It is helpful for you to write down the numbers to bring to your next appointment.  You can call the neurologists to schedule an appt:  [facility]  [address],  [place], [place] [address] Phone: [phone]  -You should be hearing from the GI doctors to schedule your colonoscopy  -You should be hearing from the surgery clinic to schedule an appointment about your hernia   -You can get the Shingrix vaccine at the pharmacy. You will need a 2nd shot 2-6 months after the first. This vaccine is important to help prevent shingles.    -You can also ask about the pnumonia vaccine at the pharmacy.  We are checking some labs today. If they are abnormal, I will call you. If they are normal, I will send you a MyChart message (if it is active) or a letter in the mail. If you do not hear about your labs in the next 2 weeks, please call the office.   Please call the clinic at [phone] if your symptoms worsen or you have any concerns.  Best, Dr Adele

## 2024-07-18 NOTE — Assessment & Plan Note (Addendum)
 Pt reports good adherence with BP medication since starting 2 weeks ago. Home readings are approaching goal of 130/80. Advised follow up in 2 weeks, may need to increase dose if readings remain elevated.

## 2024-07-18 NOTE — Progress Notes (Signed)
    SUBJECTIVE:   CHIEF COMPLAINT / HPI:   HTN --started amlodipine -valsartan 5-320 at last visit, has been taking daily --home readings: 134/87, sometimes 150/90s  Tremor --neurology referral: they have called twice and not reached pt --providing phone number to pt today  Healthcare maintenance --colonoscopy referral authorized --due for pap today. Reports no vaginal discharge, bleeding, itching, or dysuria  PERTINENT  PMH / PSH: HTN, schizoaffective disorder, delusional disorder  OBJECTIVE:   BP (!) 167/104   Pulse 95   Wt 284 lb 3.2 oz (128.9 kg)   LMP 02/16/2015   SpO2 100%   BMI 47.29 kg/m   General: Awake and conversant, no acute distress Pulm: normal work of breathing on room air GU (chaperone MS, CMA present): intertrigo present on BL inner thighs, normal external female genitalia, normal vaginal rugae, scant thick white discharge at cervical os, no lesions  Neuro: tremor in BUE Psych: Appropriate mood and affect   ASSESSMENT/PLAN:   Assessment & Plan Uncontrolled hypertension Pt reports good adherence with BP medication since starting 2 weeks ago. Home readings are approaching goal of 130/80. Advised follow up in 2 weeks, may need to increase dose if readings remain elevated. Screening for cervical cancer Will f/u with results - Cytology - PAP Intertrigo Noted on bilateral thighs during pelvic exam. - nystatin (MYCOSTATIN/NYSTOP) powder; Apply 1 Application topically 3 (three) times daily.  Dispense: 15 g; Refill: 0     Rea Raring, MD [facility]

## 2024-07-19 ENCOUNTER — Telehealth: Payer: Self-pay

## 2024-07-19 ENCOUNTER — Other Ambulatory Visit (HOSPITAL_COMMUNITY): Payer: Self-pay

## 2024-07-19 ENCOUNTER — Ambulatory Visit: Payer: Self-pay | Admitting: Family Medicine

## 2024-07-19 ENCOUNTER — Ambulatory Visit: Admitting: Family Medicine

## 2024-07-19 LAB — CYTOLOGY - PAP
Adequacy: ABSENT
Comment: NEGATIVE
Diagnosis: NEGATIVE
High risk HPV: NEGATIVE

## 2024-07-19 NOTE — Telephone Encounter (Signed)
 Pharmacy Patient Advocate Encounter   Received notification from CoverMyMeds that prior authorization for NYSTATIN POWDER is required/requested.   Insurance verification completed.   The patient is insured through [employer].   PA required; PA submitted to above mentioned insurance via Latent Key/confirmation #/EOC [id number]. Status is pending

## 2024-07-20 NOTE — Telephone Encounter (Signed)
 Pharmacy Patient Advocate Encounter  Received notification from [employer] that Prior Authorization for NYSTATIN POWDER has been APPROVED from 07/19/24 to 09/13/25   PA #/Case ID/Reference #: [id number]

## 2024-07-24 ENCOUNTER — Ambulatory Visit

## 2024-07-24 VITALS — Ht 65.0 in | Wt 284.0 lb

## 2024-07-24 DIAGNOSIS — Z0000 Encounter for general adult medical examination without abnormal findings: Secondary | ICD-10-CM | POA: Diagnosis not present

## 2024-07-24 NOTE — Progress Notes (Addendum)
 I connected with  Sally Wang on 07/24/24 by a audio enabled telemedicine application and verified that I am speaking with the correct person using two identifiers.  Patient Location: Home  Provider Location: Office/Clinic  I discussed the limitations of evaluation and management by telemedicine. The patient expressed understanding and agreed to proceed.  Subjective:   Sally Wang is a 63 y.o. female who presents for a Medicare Annual Wellness Visit.  Allergies (verified) Patient has no known allergies.   History: Past Medical History:  Diagnosis Date   Hypertension    Schizoaffective disorder [place])    Past Surgical History:  Procedure Laterality Date   CHOLECYSTECTOMY  2006   UMBILICAL HERNIA REPAIR N/A 02/29/2020   Procedure: HERNIA REPAIR UMBILICAL ADULT;  Surgeon: Vernetta Berg, MD;  Location: [facility];  Service: General;  Laterality: N/A;   Family History  Problem Relation Age of Onset   Coronary artery disease Father        died 41 yo   Diabetes Mother        died 25   Hypertension Brother    Social History   Occupational History   Occupation: homemaker  Tobacco Use   Smoking status: Never    Passive exposure: Never   Smokeless tobacco: Never  Vaping Use   Vaping status: Never Used  Substance and Sexual Activity   Alcohol use: No   Drug use: No   Sexual activity: Not Currently   Tobacco Counseling Counseling given: Not Answered  SDOH Screenings   Food Insecurity: No Food Insecurity (07/24/2024)  Housing: Low Risk  (07/24/2024)  Transportation Needs: No Transportation Needs (07/24/2024)  Utilities: Not At Risk (07/24/2024)  Alcohol Screen: Low Risk  (01/26/2020)  Depression (PHQ2-9): Low Risk  (07/24/2024)  Physical Activity: Inactive (07/24/2024)  Social Connections: Moderately Integrated (07/24/2024)  Stress: No Stress Concern Present (07/24/2024)  Tobacco Use: Low Risk  (07/24/2024)  Health Literacy: Patient Unable To Answer (07/24/2024)    Depression Screen    07/24/2024    9:24 AM 07/04/2024    3:25 PM 06/07/2024    9:50 AM 01/30/2022    6:02 PM 08/07/2013   11:18 AM  PHQ 2/9 Scores  PHQ - 2 Score 0 0 0 0 0  PHQ- 9 Score 3 0  0        Data saved with a previous flowsheet row definition     Goals Addressed             This Visit's Progress    Client understands the importance of follow-up with providers by attending scheduled visits         Visit info / Clinical Intake: Medicare Wellness Visit Type:: Initial Annual Wellness Visit Persons participating in visit:: caregiver (with patient present) Medicare Wellness Visit Mode:: Telephone If telephone:: video declined Because this visit was a virtual/telehealth visit:: pt reported vitals (husband stated) If Telephone or Video please confirm:: I connected with the patient using audio enabled telemedicine application and verified that I am speaking with the correct person using two identifiers; The patient expressed understanding and agreed to proceed; I discussed the limitations of evaluation and management by telemedicine Patient Location:: HOME Provider Location:: [facility] Information given by:: caregiver Interpreter Needed?: No Pre-visit prep was completed: yes AWV questionnaire completed by patient prior to visit?: no Living arrangements:: lives with spouse/significant other Patient's Overall Health Status Rating: good (HUSBAND STATED) Typical amount of pain: some Does pain affect daily life?: (!) yes Are you  currently prescribed opioids?: no  Dietary Habits and Nutritional Risks How many meals a day?: 3 Eats fruit and vegetables daily?: yes Most meals are obtained by: preparing own meals (HUSBAND PREPARES MEALS) In the last 2 weeks, have you had any of the following?: none Diabetic:: no  Functional Status Activities of Daily Living (to include ambulation/medication): Independent Feeding: Independent Dressing/Grooming:  Independent Bathing: Independent Toileting: Independent Transfer: Independent Ambulation: Independent Medication Administration: Needs assistance (comment) Home Management: Needs assistance (comment) Manage your own finances?: (!) no Primary transportation is: family/friends Concerns about vision?: no *vision screening is required for WTM* Concerns about hearing?: no  Fall Screening Falls in the past year?: 0 Number of falls in past year: 0 Was there an injury with Fall?: 0 Fall Risk Category Calculator: 0 Patient Fall Risk Level: Low Fall Risk  Fall Risk Patient at Risk for Falls Due to: No Fall Risks Fall risk Follow up: Falls evaluation completed; Education provided  Home and Transportation Safety: All rugs have non-skid backing?: yes All stairs or steps have railings?: yes Grab bars in the bathtub or shower?: (!) no Have non-skid surface in bathtub or shower?: (!) no Good home lighting?: yes Regular seat belt use?: yes Hospital stays in the last year:: (!) yes How many hospital stays:: 2 Reason: MENTAL DISORDER  Cognitive Assessment Difficulty concentrating, remembering, or making decisions? : yes Will 6CIT or Mini Cog be Completed: no 6CIT or Mini Cog Declined: patient declined  Advance Directives (For Healthcare) Does Patient Have a Medical Advance Directive?: No Does patient want to make changes to medical advance directive?: No - Patient declined Type of Advance Directive: Healthcare Power of [place]; Living will Copy of Healthcare Power of Attorney in Chart?: No - copy requested Copy of Living Will in Chart?: No - copy requested Would patient like information on creating a medical advance directive?: No - Patient declined  Reviewed/Updated  Reviewed/Updated: Reviewed All (Medical, Surgical, Family, Medications, Allergies, Care Teams, Patient Goals)        Objective:    Today's Vitals   07/24/24 0913  Weight: 284 lb (128.8 kg)  Height: 5' 5 (1.651  m)  PainSc: 2   PainLoc: Knee   Body mass index is 47.26 kg/m.  Current Medications (verified) Outpatient Encounter Medications as of 07/24/2024  Medication Sig   ALPHA LIPOIC ACID PO Take 600 mg by mouth daily.   amLODipine -valsartan (EXFORGE) 5-320 MG tablet Take 1 tablet by mouth daily.   Blood Pressure Monitoring (ADULT BLOOD PRESSURE CUFF LG) KIT Please monitor your blood pressure daily and keep a log to bring to your appointments   Divalproex  Sodium (DEPAKOTE  PO) Take by mouth as needed (for anxiety).   Haloperidol  Lactate (HALDOL  IJ) Inject as directed every 30 (thirty) days.   MAGNESIUM  MALATE PO Take 3,750 mg by mouth 2 (two) times daily with a meal.   nystatin (MYCOSTATIN/NYSTOP) powder Apply 1 Application topically 3 (three) times daily.   No facility-administered encounter medications on file as of 07/24/2024.   Hearing/Vision screen Hearing Screening - Comments:: Denies hearing difficulties.   Vision Screening - Comments:: Wears rx glasses - up to date with routine eye exams with [facility]  Immunizations and Health Maintenance Health Maintenance  Topic Date Due   DTaP/Tdap/Td (1 - Tdap) Never done   Colonoscopy  Never done   Pneumococcal Vaccine: 50+ Years (1 of 1 - PCV) Never done   Zoster Vaccines- Shingrix (1 of 2) Never done   COVID-19 Vaccine (1 -  2025-26 season) Never done   Medicare Annual Wellness (AWV)  07/24/2025   Mammogram  07/04/2026   Cervical Cancer Screening (HPV/Pap Cotest)  07/18/2029   Influenza Vaccine  Completed   Hepatitis C Screening  Completed   HIV Screening  Completed   Hepatitis B Vaccines 19-59 Average Risk  Aged Out   HPV VACCINES  Aged Out   Meningococcal B Vaccine  Aged Out        Assessment/Plan:  This is a routine wellness examination for Z[place].  Patient Care Team: Adele Song, MD as PCP - General (Family Medicine) Joshua Rush, OD as Consulting Physician (Optometry)  I have personally reviewed and noted the  following in the patient's chart:   Medical and social history Use of alcohol, tobacco or illicit drugs  Current medications and supplements including opioid prescriptions. Functional ability and status Nutritional status Physical activity Advanced directives List of other physicians Hospitalizations, surgeries, and ER visits in previous 12 months Vitals Screenings to include cognitive, depression, and falls Referrals and appointments  No orders of the defined types were placed in this encounter.  In addition, I have reviewed and discussed with patient certain preventive protocols, quality metrics, and best practice recommendations. A written personalized care plan for preventive services as well as general preventive health recommendations were provided to patient.   Sally LOISE Fuller, LPN   [id number]   Return in about 1 year (around 07/24/2025) for Medicare wellness.  After Visit Summary: (MyChart) Due to this being a telephonic visit, the after visit summary with patients personalized plan was offered to patient via MyChart   Nurse Notes: Per spouse, patient does not get vaccines unless its flu.  Patient is due for colonoscopy.  Patient has been scheduled for follow up appointment with PCP.

## 2024-07-24 NOTE — Patient Instructions (Signed)
 Sally Wang,  Thank you for taking the time for your Medicare Wellness Visit. I appreciate your continued commitment to your health goals. Please review the care plan we discussed, and feel free to reach out if I can assist you further.  Please note that Annual Wellness Visits do not include a physical exam. Some assessments may be limited, especially if the visit was conducted virtually. If needed, we may recommend an in-person follow-up with your provider.  Ongoing Care Seeing your primary care provider every 3 to 6 months helps us  monitor your health and provide consistent, personalized care.   Referrals If a referral was made during today's visit and you haven't received any updates within two weeks, please contact the referred provider directly to check on the status.  Recommended Screenings:  Health Maintenance  Topic Date Due   Medicare Annual Wellness Visit  Never done   DTaP/Tdap/Td vaccine (1 - Tdap) Never done   Colon Cancer Screening  Never done   Pneumococcal Vaccine for age over 23 (1 of 1 - PCV) Never done   Zoster (Shingles) Vaccine (1 of 2) Never done   COVID-19 Vaccine (1 - 2025-26 season) Never done   Breast Cancer Screening  07/04/2026   Pap with HPV screening  07/18/2029   Flu Shot  Completed   Hepatitis C Screening  Completed   HIV Screening  Completed   Hepatitis B Vaccine  Aged Out   HPV Vaccine  Aged Out   Meningitis B Vaccine  Aged Out       07/24/2024    9:18 AM  Advanced Directives  Does Patient Have a Medical Advance Directive? Yes  Type of Estate Agent of [place];Living will  Does patient want to make changes to medical advance directive? No - Patient declined  Copy of Healthcare Power of Attorney in Chart? No - copy requested    Vision: Annual vision screenings are recommended for early detection of glaucoma, cataracts, and diabetic retinopathy. These exams can also reveal signs of chronic conditions such as diabetes and  high blood pressure.  Dental: Annual dental screenings help detect early signs of oral cancer, gum disease, and other conditions linked to overall health, including heart disease and diabetes.  Please see the attached documents for additional preventive care recommendations.

## 2024-08-01 ENCOUNTER — Telehealth: Payer: Self-pay

## 2024-08-01 NOTE — Telephone Encounter (Signed)
 Patient LVM on nurse line requesting prescription for muscle relaxer. She reports that she is having issues with lower back pain.   Attempted to call patient back to discuss concern further. As this is not a medication that she is currently being prescribed, patient will likely need an appointment.   Given that patient did not answer, LVM asking that patient return call during office hours to schedule appointment for request.   Chiquita JAYSON English, RN

## 2024-08-03 NOTE — Telephone Encounter (Signed)
 Patients husband returns call to nurse line.   He reports the patient has been having difficulty with her lower back. He reports she is requesting muscle relaxer's.  Advised patient would need to be seen, as this is a new problem.   Offered an apt for this afternoon and one for tomorrow morning with PCP.   They declined to make an apt at this.

## 2024-08-04 ENCOUNTER — Ambulatory Visit: Admitting: Family Medicine

## 2024-08-09 ENCOUNTER — Ambulatory Visit: Admitting: Family Medicine

## 2024-08-09 ENCOUNTER — Encounter: Payer: Self-pay | Admitting: Family Medicine

## 2024-08-09 VITALS — BP 152/92 | HR 123 | Ht 65.0 in | Wt 301.8 lb

## 2024-08-09 DIAGNOSIS — I10 Essential (primary) hypertension: Secondary | ICD-10-CM

## 2024-08-09 DIAGNOSIS — G8929 Other chronic pain: Secondary | ICD-10-CM | POA: Diagnosis not present

## 2024-08-09 DIAGNOSIS — R600 Localized edema: Secondary | ICD-10-CM

## 2024-08-09 DIAGNOSIS — M545 Low back pain, unspecified: Secondary | ICD-10-CM | POA: Diagnosis not present

## 2024-08-09 DIAGNOSIS — F250 Schizoaffective disorder, bipolar type: Secondary | ICD-10-CM | POA: Diagnosis not present

## 2024-08-09 MED ORDER — AMLODIPINE BESYLATE-VALSARTAN 10-320 MG PO TABS: 1.0000 | ORAL_TABLET | Freq: Every day | ORAL | 0 refills | Status: DC

## 2024-08-09 MED ORDER — CYCLOBENZAPRINE HCL 5 MG PO TABS: 5.0000 mg | ORAL_TABLET | Freq: Three times a day (TID) | ORAL | 0 refills | Status: AC | PRN

## 2024-08-09 NOTE — Patient Instructions (Addendum)
 Thank you for coming in today! Here is a summary of what we discussed:  Call to schedule your colonoscopy: [facility] [address] [place] [address], [place], [place] [address] Phone: [phone]  Please take the new dose of the blood pressure medicine. Please keep checking your blood pressure at home.  You should alternate Aleve and tylenol  for your back pain. You can try the Flexeril  if this is not working.  Please schedule a follow up with me in a few weeks  Please call the clinic at [phone] if your symptoms worsen or you have any concerns.  Best, Dr Adele

## 2024-08-09 NOTE — Progress Notes (Signed)
    SUBJECTIVE:   CHIEF COMPLAINT / HPI:   Leg swelling --BLE swelling for a while --sleeps in recliner due to difficulty getting out of low bed --can lay flat without SOB --no chest pain -- Has not noticed worsening edema since starting blood pressure medicine that contains amlodipine   Low back pain --would like muscle relaxer --has been present for about 1 year, fell in the bathtub and hit her back --no other injuries/accidents recently --endorses pain near kidneys on both sides --no sciatica  --no loss of sensation in GU area --no numbness, tingling, weakness --uses Aleve PRN  HTN --on amlodipine -valsartan  5-320mg  --checks at home 141/91 or 131/81, sometimes 160/100s --has taken meds every morning --no chest pain, SOB --no dizziness --no blurred vision  Polyuria --no dysuria --has accidents, doesn't make it to the bathroom sometimes, feels like she has to pee right after she goes --has tried cutting back sugar, starches, caffeine  --symptoms for about 1 year --3 children (1 vaginal)  Colonoscopy - referral authorized 06/21/24  PERTINENT  PMH / PSH: schizophrenia  OBJECTIVE:   BP (!) 215/191   Pulse (!) 123   Ht 5' 5 (1.651 m)   Wt (!) 301 lb 12.8 oz (136.9 kg)   LMP 02/16/2015   SpO2 97%   BMI 50.22 kg/m   General: Awake and conversant, no acute distress Pulm: normal work of breathing on room air Neuro: resting tremor, R eyelid droop (baseline), hand contractures MSK: no tenderness to palpation along spinal canal or paraspinal muscles Psych: Appropriate mood and affect Ext: pitting edema to BLE, DP pulses palpated in BL feet, PT pulses difficult to palpate 2/2 edema.  Skin: warm, dry, no rashes or lesions   ASSESSMENT/PLAN:   Assessment & Plan Uncontrolled hypertension Significantly elevated readings today.  Patient reports closer to normal readings at home.  No red flag symptoms today.  Will increase dose of current medication today and  advised follow-up in a few weeks.  Can consider ambulatory blood pressure monitoring moving forward if needed. Leg edema Patient reports chronic BLE edema.  No other signs or symptoms of heart failure however no echo on file.  Would like to obtain this to rule out cardiac abnormalities.  If echo is normal, can consider compression stockings or other treatments.  Will also monitor for worsening of swelling on higher dose of amlodipine . Chronic bilateral low back pain without sciatica No red flag signs or symptoms today.  Advised alternating Aleve and Tylenol  as needed.  Patient requested muscle relaxer to use as needed, provided short course of Flexeril  today and advised using this sparingly.  Will continue to monitor at follow-up appointments.     Rea Raring, MD [facility]

## 2024-08-09 NOTE — Assessment & Plan Note (Signed)
 Significantly elevated readings today.  Patient reports closer to normal readings at home.  No red flag symptoms today.  Will increase dose of current medication today and advised follow-up in a few weeks.  Can consider ambulatory blood pressure monitoring moving forward if needed.

## 2024-08-17 ENCOUNTER — Ambulatory Visit (HOSPITAL_COMMUNITY): Admission: RE | Admit: 2024-08-17 | Discharge: 2024-08-17 | Attending: Family Medicine | Admitting: Family Medicine

## 2024-08-17 DIAGNOSIS — R600 Localized edema: Secondary | ICD-10-CM | POA: Diagnosis not present

## 2024-08-17 DIAGNOSIS — I517 Cardiomegaly: Secondary | ICD-10-CM | POA: Diagnosis not present

## 2024-08-17 DIAGNOSIS — I358 Other nonrheumatic aortic valve disorders: Secondary | ICD-10-CM | POA: Diagnosis not present

## 2024-08-17 LAB — ECHOCARDIOGRAM COMPLETE
AR max vel: 2.3 cm2
AV Area VTI: 2.45 cm2
AV Area mean vel: 2.35 cm2
AV Mean grad: 8 mmHg
AV Peak grad: 15.1 mmHg
Ao pk vel: 1.94 m/s
Calc EF: 66.3 %
S' Lateral: 2.7 cm
Single Plane A2C EF: 67.8 %
Single Plane A4C EF: 66.8 %

## 2024-08-18 ENCOUNTER — Ambulatory Visit: Payer: Self-pay | Admitting: Family Medicine

## 2024-08-18 NOTE — Progress Notes (Signed)
 Assessment/Plan:  Assessment and Plan Assessment & Plan Parkinsonism -I had a long counseling session with the patient today.  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  I explained that one clinically cannot tell the difference between idiopathic parkinsons disease and secondary parkinsonism from medication.  I also explained that even if one is able to get off of the medication, it can take up to 6 months to clinically definitively know if this is idiopathic parkinsons disease.  I did not advise that the patient go off of medication, as this needs to be discussed with the patients prescribing physician.  She has been on it for years.  I did, however, tell the patient that the longer one is on the medication, the worse the symptoms can get.  The patient is to make an appointment with [facility] to discuss what I have discussed with her.   Tardive dyskinesia Mild tongue rolling and foot movements suggestive of tardive dyskinesia due to long-term Haldol  use. Symptoms require monitoring. - Discuss with psychiatrist the potential need for medication adjustment to address tardive dyskinesia.  Bilateral ulnar neuropathy with claw hand Claw hand deformity due to ulnar nerve injury. Symptoms include flexion of fingers, particularly the last fingers. - Scheduled EMG test to assess ulnar nerve function and identify any nerve injury. - Provided information on EMG test and scheduling.   Subjective:  Discussed the use of AI scribe software for clinical note transcription with the patient, who gave verbal consent to proceed.  History of Present Illness Sally Wang is a 63 year old female with schizoaffective disorder who presents with tremors and medication side effects. She was referred by Dr. Aureliano for evaluation of tremors.  She has been experiencing tremors for approximately eight months, initially starting in the right hand, progressing to the left hand,  and now affecting the right leg. The tremors occur both at rest and some during activity, impacting her ability to write, which she describes as 'scribbling like a first grader.' They do not significantly interfere with eating or drinking.  She has a history of schizoaffective disorder and has been on Haldol  injections since 1998, with Depakote  added about a year ago. She suspects her medications may be contributing to her symptoms, including tremors, rolling of the tongue, and blinking of the eyes. She reports that she does not think the dose of Haldol  has changed much over the years.  She reports that she has not been able to see her psych NP in about a year.  She does see the nurse who injects her.  They had a video visit a month ago but it had to be cancelled because the NP had an emergency.  She experiences joint stiffness and aches, particularly in her knees and back, making it difficult to rise from low chairs without assistance. She walks slowly with short steps but denies shuffling and has not experienced any falls. She reports a bitter taste and dry mouth in the mornings, which resolves after eating and drinking.  She reports frequent urination and has started taking a supplement called beta bladder to address fluid retention and leg swelling, which she refers to as edema. She is able to dress herself but finds it challenging due to the tremors and stiffness in her hands, which have started to 'draw up' over the past year. Her granddaughter assists her with personal hygiene tasks.  There is no known family history of tremors. She reports her  voice has not changed very much, her balance is okay, and she has not experienced any falls. No issues with swallowing or choking.    ALLERGIES:  No Known Allergies  CURRENT MEDICATIONS:  Current Meds  Medication Sig   acetaminophen  (TYLENOL ) 325 MG tablet Take 650 mg by mouth every 6 (six) hours as needed.   ALPHA LIPOIC ACID PO Take 600 mg by mouth  daily.   amLODipine -valsartan  (EXFORGE ) 10-320 MG tablet Take 1 tablet by mouth daily.   Blood Pressure Monitoring (ADULT BLOOD PRESSURE CUFF LG) KIT Please monitor your blood pressure daily and keep a log to bring to your appointments   cyclobenzaprine  (FLEXERIL ) 5 MG tablet Take 1 tablet (5 mg total) by mouth 3 (three) times daily as needed for muscle spasms.   Divalproex  Sodium (DEPAKOTE  PO) Take by mouth as needed (for anxiety).   Haloperidol  Lactate (HALDOL  IJ) Inject as directed every 30 (thirty) days.   MAGNESIUM  MALATE PO Take 3,750 mg by mouth 2 (two) times daily with a meal.   NON FORMULARY daily. Bowel and bladder     Objective:   VITALS:   Vitals:   08/22/24 0925  BP: (!) 170/100  Pulse: (!) 110  SpO2: 97%  Weight: 299 lb 9.6 oz (135.9 kg)  Height: 5' 6 (1.676 m)    GEN:  The patient appears stated age and is in NAD. HEENT:  Normocephalic, atraumatic.  The mucous membranes are moist. The superficial temporal arteries are without ropiness or tenderness. CV:  tachycardic.  regular Lungs:  CTAB Neck/HEME:  There are no carotid bruits bilaterally.  Neurological examination:  Orientation: The patient is alert and oriented x3.  Cranial nerves: There is mild R facial droop. Extraocular muscles are intact. The visual fields are full to confrontational testing. The speech is fluent and clear. Soft palate rises symmetrically and there is no tongue deviation. Hearing is intact to conversational tone. Sensation: Sensation is intact to light and pinprick throughout (facial, trunk, extremities). Vibration is intact at the bilateral big toe. There is no extinction with double simultaneous stimulation. There is no sensory dermatomal level identified. Motor: Strength is 5/5 in the bilateral upper and lower extremities.  There is claw hand deformity bilaterally (reports present x 1 year).   Shoulder shrug is equal and symmetric.  There is no pronator drift.   Movement  examination: Tone: There is nl tone in the bilateral upper extremities.  The tone in the lower extremities is nl.  Abnormal movements: there is tongue movement in the mouth but it doesn't move outside of the mouth; rare dyskinetic movements in the feet.  there is mod tremor in the UE at rest Coordination:  There is no decremation with RAM's, with any form of RAMS, including alternating supination and pronation of the forearm, hand opening and closing (limited by claw hand deformity), finger taps, heel taps and toe taps.  Gait and Station: The patient pushes off to arise.   The patient's stride length is good.   I have reviewed and interpreted the following labs independently   Chemistry      Component Value Date/Time   NA 140 07/04/2024 1537   K 4.4 07/04/2024 1537   CL 100 07/04/2024 1537   CO2 27 07/04/2024 1537   BUN 12 07/04/2024 1537   CREATININE 0.89 07/04/2024 1537   CREATININE 0.83 06/17/2011 1131      Component Value Date/Time   CALCIUM 9.7 07/04/2024 1537   ALKPHOS 63 01/28/2023 0227  AST 15 01/28/2023 0227   ALT 13 01/28/2023 0227   BILITOT 0.5 01/28/2023 0227      Lab Results  Component Value Date   TSH 1.990 06/16/2024   Lab Results  Component Value Date   WBC 5.1 06/07/2024   HGB 13.8 06/07/2024   HCT 43.2 06/07/2024   MCV 93 06/07/2024   PLT 256 06/07/2024     Total time spent on today's visit was 60 minutes, including both face-to-face time and nonface-to-face time.  Time included that spent on review of records (prior notes available to me/labs/imaging if pertinent), discussing treatment and goals, answering patient's questions and coordinating care.  Cc:  Adele Song, MD

## 2024-08-21 ENCOUNTER — Ambulatory Visit: Admitting: Neurology

## 2024-08-22 ENCOUNTER — Ambulatory Visit: Admitting: Family Medicine

## 2024-08-22 ENCOUNTER — Encounter: Payer: Self-pay | Admitting: Family Medicine

## 2024-08-22 ENCOUNTER — Ambulatory Visit: Admitting: Neurology

## 2024-08-22 ENCOUNTER — Encounter: Payer: Self-pay | Admitting: Neurology

## 2024-08-22 VITALS — BP 148/80 | HR 100 | Ht 66.0 in | Wt 296.0 lb

## 2024-08-22 VITALS — BP 168/88 | HR 110 | Ht 66.0 in | Wt 299.6 lb

## 2024-08-22 DIAGNOSIS — G5623 Lesion of ulnar nerve, bilateral upper limbs: Secondary | ICD-10-CM

## 2024-08-22 DIAGNOSIS — R251 Tremor, unspecified: Secondary | ICD-10-CM

## 2024-08-22 DIAGNOSIS — G2111 Neuroleptic induced parkinsonism: Secondary | ICD-10-CM

## 2024-08-22 DIAGNOSIS — R600 Localized edema: Secondary | ICD-10-CM

## 2024-08-22 DIAGNOSIS — I10 Essential (primary) hypertension: Secondary | ICD-10-CM

## 2024-08-22 DIAGNOSIS — T43505A Adverse effect of unspecified antipsychotics and neuroleptics, initial encounter: Secondary | ICD-10-CM

## 2024-08-22 DIAGNOSIS — G2401 Drug induced subacute dyskinesia: Secondary | ICD-10-CM | POA: Diagnosis not present

## 2024-08-22 DIAGNOSIS — F250 Schizoaffective disorder, bipolar type: Secondary | ICD-10-CM

## 2024-08-22 DIAGNOSIS — G629 Polyneuropathy, unspecified: Secondary | ICD-10-CM

## 2024-08-22 MED ORDER — ADULT BLOOD PRESSURE CUFF LG KIT: 1.0000 | PACK | Freq: Every day | 0 refills | Status: DC

## 2024-08-22 NOTE — Patient Instructions (Addendum)
 Thank you for coming in today! Here is a summary of what we discussed:  To check about the colonoscopy: [facility] Phone: [phone]  Please keep checking your blood pressure and taking your blood pressure medicine daily.   Please wear the compression stockings daily. You can get these at any medical supply store in the area.  Schedule an appt with your psychiatrist to review your medicines and side effects.  Please follow up with me in 1 month- bring all your medicines and any over the counter pills you are taking.  Please call the clinic at [phone] if your symptoms worsen or you have any concerns.  Best, Dr Adele

## 2024-08-22 NOTE — Progress Notes (Signed)
    SUBJECTIVE:   CHIEF COMPLAINT / HPI:   HTN --increased to amlodipine  valsartan  10-320 at last visit (11/26) --Has been taking this daily --thinks home readings are incorrect, states blood pressure cuff is too small  Leg swelling --no signs of CHF on echo 12/4 (EF 65-70%, mild LVH, G1DD) --no HF sx --thinks leg swelling is the same as before if not slightly improved  Tremor --saw neuro today, diagnosed with secondary parkinsonism, TD, and BL ulnar neuropathy with claw hand --needs to follow up with psychiatry for med management  Reports dysuria and urinary frequency is improved. Son got her an OTC supplement called Better bladder  PERTINENT  PMH / PSH: schizoaffective disorder  OBJECTIVE:   BP (!) 148/80   Pulse 100   Ht 5' 6 (1.676 m)   Wt 296 lb (134.3 kg)   LMP 02/16/2015   SpO2 96%   BMI 47.78 kg/m   - General: No acute distress. Awake and conversant. - Eyes: Normal conjunctiva, anicteric. Round symmetric pupils. - ENT: Hearing grossly intact. No nasal discharge. - Neck: Neck is supple. No masses or thyromegaly. - Respiratory: Respirations are non-labored. - Skin: No visible rashes or ulcers. Pitting edema to BLE, no skin changes - Psych: Alert and oriented. Cooperative, Appropriate mood and affect, Normal judgment. - MSK: Normal ambulation. BUE resting tremor.     ASSESSMENT/PLAN:   Assessment & Plan Uncontrolled hypertension Blood pressure much improved today.  Will continue same medication regimen for now, advised checking home blood pressure daily and following up in 1 month.  Ordered larger blood pressure cuff.  Can consider adding third agent at that time if BP remains >130/80. Leg edema Normal echo and no signs of heart failure clinically.  Advised patient to wear compression stockings daily. Tremor Had appointment with neurology today, diagnosed with possible tardive dyskinesia, secondary parkinsonism due to Haldol , and bilateral ulnar neuropathy.   Reinforced recommendation from neurologist to schedule appointment with psychiatry to review medications.  Patient to follow-up with neurology for EMG.     Rea Raring, MD [facility]

## 2024-08-22 NOTE — Patient Instructions (Addendum)
 VISIT SUMMARY: You came in today to discuss the tremors and medication side effects you have been experiencing. The tremors have been affecting your hands and right knee, and you suspect your medications might be contributing to these symptoms. We discussed your history of schizoaffective disorder and the medications you are currently taking, including Haldol  and Depakote . You also mentioned experiencing joint stiffness, frequent urination, and some difficulty with personal hygiene tasks due to the tremors and stiffness in your hands.  YOUR PLAN: -DRUG-INDUCED SECONDARY PARKINSONISM: Your tremors in the hands and right leg are likely due to long-term use of Haldol , which can block dopamine receptors and mimic Parkinson's disease symptoms. These side effects may persist for six months or longer. We recommend scheduling an appointment with your psychiatrist to discuss your medication management and potential changes to your Haldol  regimen. We have provided you with notes on secondary parkinsonism to discuss with your psychiatrist.  -TARDIVE DYSKINESIA: The mild tongue rolling and foot movements you are experiencing are suggestive of tardive dyskinesia, a condition caused by long-term use of certain medications like Haldol . These symptoms require monitoring. Please discuss with your psychiatrist the potential need for medication adjustment to address tardive dyskinesia.  However, the medications for tardive dyskinesia can WORSEN parkinsonism and I don't necessarily recommend them right now  -BILATERAL ULNAR NEUROPATHY WITH CLAW HAND: The stiffness and 'drawing up' of your hands are due to a condition called bilateral ulnar neuropathy, which involves injury to the ulnar nerve and can cause a claw-like deformity of the fingers. We have scheduled an EMG test to assess the function of your ulnar nerve and identify any nerve injury. Information on the EMG test and scheduling has been provided to  you.  INSTRUCTIONS: Please schedule an appointment with your psychiatrist to discuss your medication management, particularly regarding Haldol . Additionally, ensure you attend the scheduled EMG test to assess your ulnar nerve function.  ELECTROMYOGRAM AND NERVE CONDUCTION STUDIES (EMG/NCS) INSTRUCTIONS  How to Prepare The neurologist conducting the EMG will need to know if you have certain medical conditions. Tell the neurologist and other EMG lab personnel if you: Have a pacemaker or any other electrical medical device Take blood-thinning medications Have hemophilia, a blood-clotting disorder that causes prolonged bleeding Bathing Take a shower or bath shortly before your exam in order to remove oils from your skin. Don't apply lotions or creams before the exam.  What to Expect You'll likely be asked to change into a hospital gown for the procedure and lie down on an examination table. The following explanations can help you understand what will happen during the exam.  Electrodes. The neurologist or a technician places surface electrodes at various locations on your skin depending on where you're experiencing symptoms. Or the neurologist may insert needle electrodes at different sites depending on your symptoms.  Sensations. The electrodes will at times transmit a tiny electrical current that you may feel as a twinge or spasm. The needle electrode may cause discomfort or pain that usually ends shortly after the needle is removed. If you are concerned about discomfort or pain, you may want to talk to the neurologist about taking a short break during the exam.  Instructions. During the needle EMG, the neurologist will assess whether there is any spontaneous electrical activity when the muscle is at rest - activity that isn't present in healthy muscle tissue - and the degree of activity when you slightly contract the muscle.  He or she will give you instructions on resting and  contracting a muscle  at appropriate times. Depending on what muscles and nerves the neurologist is examining, he or she may ask you to change positions during the exam.  After your EMG You may experience some temporary, minor bruising where the needle electrode was inserted into your muscle. This bruising should fade within several days. If it persists, contact your primary care doctor.                        Contains text generated by [employer]bridge.                                 Contains text generated by [employer]bridge.

## 2024-08-22 NOTE — Assessment & Plan Note (Signed)
 Blood pressure much improved today.  Will continue same medication regimen for now, advised checking home blood pressure daily and following up in 1 month.  Ordered larger blood pressure cuff.  Can consider adding third agent at that time if BP remains >130/80.

## 2024-08-28 ENCOUNTER — Encounter: Payer: Self-pay | Admitting: Gastroenterology

## 2024-08-28 ENCOUNTER — Other Ambulatory Visit: Payer: Self-pay

## 2024-08-28 DIAGNOSIS — L304 Erythema intertrigo: Secondary | ICD-10-CM

## 2024-08-29 MED ORDER — NYSTATIN 100000 UNIT/GM EX POWD: 1.0000 | Freq: Three times a day (TID) | CUTANEOUS | 0 refills | Status: AC

## 2024-09-06 ENCOUNTER — Ambulatory Visit

## 2024-09-06 VITALS — Ht 66.0 in | Wt 295.0 lb

## 2024-09-06 DIAGNOSIS — Z1211 Encounter for screening for malignant neoplasm of colon: Secondary | ICD-10-CM

## 2024-09-06 MED ORDER — NA SULFATE-K SULFATE-MG SULF 17.5-3.13-1.6 GM/177ML PO SOLN: 1.0000 | Freq: Once | ORAL | 0 refills | Status: AC

## 2024-09-06 NOTE — Progress Notes (Signed)
 No egg or soy allergy known to patient  No issues known to pt with past sedation with any surgeries or procedures Patient denies ever being told they had issues or difficulty with intubation  No FH of Malignant Hyperthermia Pt is not on diet pills Pt is not on  home 02  Pt is not on blood thinners  Pt denies issues with constipation  No A fib or A flutter Have any cardiac testing pending--NO Pt can ambulate-walk slowly due to joint stiffness- no cane or walker yet   Pt denies use of chewing tobacco Discussed diabetic I weight loss medication holds Discussed NSAID holds Checked BMI Pt instructed to use Singlecare.com or GoodRx for a price reduction on prep  Patient's chart reviewed by Norleen Schillings CNRA prior to previsit and patient appropriate for the [facility].  Pre visit completed and red dot placed by patient's name on their procedure day (on provider's schedule).

## 2024-09-09 ENCOUNTER — Observation Stay (HOSPITAL_COMMUNITY)
Admission: EM | Admit: 2024-09-09 | Discharge: 2024-09-11 | Disposition: A | Attending: Family Medicine | Admitting: Family Medicine

## 2024-09-09 ENCOUNTER — Other Ambulatory Visit: Payer: Self-pay

## 2024-09-09 ENCOUNTER — Encounter (HOSPITAL_COMMUNITY): Payer: Self-pay

## 2024-09-09 DIAGNOSIS — I82432 Acute embolism and thrombosis of left popliteal vein: Secondary | ICD-10-CM | POA: Diagnosis not present

## 2024-09-09 DIAGNOSIS — F1090 Alcohol use, unspecified, uncomplicated: Secondary | ICD-10-CM | POA: Diagnosis not present

## 2024-09-09 DIAGNOSIS — L304 Erythema intertrigo: Secondary | ICD-10-CM

## 2024-09-09 DIAGNOSIS — I110 Hypertensive heart disease with heart failure: Secondary | ICD-10-CM | POA: Insufficient documentation

## 2024-09-09 DIAGNOSIS — M79604 Pain in right leg: Secondary | ICD-10-CM | POA: Diagnosis not present

## 2024-09-09 DIAGNOSIS — R1024 Suprapubic pain: Secondary | ICD-10-CM | POA: Insufficient documentation

## 2024-09-09 DIAGNOSIS — Z79899 Other long term (current) drug therapy: Secondary | ICD-10-CM | POA: Insufficient documentation

## 2024-09-09 DIAGNOSIS — K219 Gastro-esophageal reflux disease without esophagitis: Secondary | ICD-10-CM | POA: Diagnosis not present

## 2024-09-09 DIAGNOSIS — M79605 Pain in left leg: Secondary | ICD-10-CM | POA: Diagnosis not present

## 2024-09-09 DIAGNOSIS — R109 Unspecified abdominal pain: Secondary | ICD-10-CM | POA: Diagnosis present

## 2024-09-09 DIAGNOSIS — I5030 Unspecified diastolic (congestive) heart failure: Secondary | ICD-10-CM | POA: Insufficient documentation

## 2024-09-09 DIAGNOSIS — I10 Essential (primary) hypertension: Secondary | ICD-10-CM

## 2024-09-09 DIAGNOSIS — R1013 Epigastric pain: Secondary | ICD-10-CM | POA: Insufficient documentation

## 2024-09-09 DIAGNOSIS — I2699 Other pulmonary embolism without acute cor pulmonale: Principal | ICD-10-CM

## 2024-09-09 DIAGNOSIS — Z789 Other specified health status: Secondary | ICD-10-CM

## 2024-09-09 LAB — COMPREHENSIVE METABOLIC PANEL WITH GFR
ALT: 26 U/L (ref 0–44)
AST: 36 U/L (ref 15–41)
Albumin: 4.1 g/dL (ref 3.5–5.0)
Alkaline Phosphatase: 104 U/L (ref 38–126)
Anion gap: 12 (ref 5–15)
BUN: 15 mg/dL (ref 8–23)
CO2: 28 mmol/L (ref 22–32)
Calcium: 9.3 mg/dL (ref 8.9–10.3)
Chloride: 97 mmol/L — ABNORMAL LOW (ref 98–111)
Creatinine, Ser: 0.99 mg/dL (ref 0.44–1.00)
GFR, Estimated: >60 mL/min
Glucose, Bld: 201 mg/dL — ABNORMAL HIGH (ref 70–99)
Potassium: 3.9 mmol/L (ref 3.5–5.1)
Sodium: 137 mmol/L (ref 135–145)
Total Bilirubin: 0.3 mg/dL (ref 0.0–1.2)
Total Protein: 8.1 g/dL (ref 6.5–8.1)

## 2024-09-09 LAB — URINALYSIS, ROUTINE W REFLEX MICROSCOPIC
Bacteria, UA: NONE SEEN
Bilirubin Urine: NEGATIVE
Glucose, UA: 50 mg/dL — AB
Hgb urine dipstick: NEGATIVE
Ketones, ur: NEGATIVE mg/dL
Leukocytes,Ua: NEGATIVE
Nitrite: NEGATIVE
Protein, ur: 30 mg/dL — AB
Specific Gravity, Urine: 1.023 (ref 1.005–1.030)
pH: 5 (ref 5.0–8.0)

## 2024-09-09 LAB — CBC
HCT: 39.9 % (ref 36.0–46.0)
Hemoglobin: 13.1 g/dL (ref 12.0–15.0)
MCH: 31 pg (ref 26.0–34.0)
MCHC: 32.8 g/dL (ref 30.0–36.0)
MCV: 94.3 fL (ref 80.0–100.0)
Platelets: 227 K/uL (ref 150–400)
RBC: 4.23 MIL/uL (ref 3.87–5.11)
RDW: 12.3 % (ref 11.5–15.5)
WBC: 6.9 K/uL (ref 4.0–10.5)
nRBC: 0 % (ref 0.0–0.2)

## 2024-09-09 LAB — LIPASE, BLOOD: Lipase: 57 U/L — ABNORMAL HIGH (ref 11–51)

## 2024-09-09 NOTE — ED Triage Notes (Addendum)
 Involuntary hand tremor noted, pt reports this is ongoing problem for months , strong urine smell noted.

## 2024-09-09 NOTE — ED Triage Notes (Signed)
 PT states generalized abdominal pain, bil flank pain and burning with urination.  Symptoms started this am.

## 2024-09-10 ENCOUNTER — Emergency Department (HOSPITAL_COMMUNITY)

## 2024-09-10 DIAGNOSIS — I2699 Other pulmonary embolism without acute cor pulmonale: Secondary | ICD-10-CM | POA: Diagnosis not present

## 2024-09-10 DIAGNOSIS — R1013 Epigastric pain: Secondary | ICD-10-CM | POA: Insufficient documentation

## 2024-09-10 DIAGNOSIS — M79604 Pain in right leg: Secondary | ICD-10-CM | POA: Insufficient documentation

## 2024-09-10 DIAGNOSIS — Z789 Other specified health status: Secondary | ICD-10-CM

## 2024-09-10 DIAGNOSIS — R1024 Suprapubic pain: Secondary | ICD-10-CM | POA: Insufficient documentation

## 2024-09-10 DIAGNOSIS — K219 Gastro-esophageal reflux disease without esophagitis: Secondary | ICD-10-CM | POA: Insufficient documentation

## 2024-09-10 LAB — HEPARIN LEVEL (UNFRACTIONATED): Heparin Unfractionated: 0.44 [IU]/mL (ref 0.30–0.70)

## 2024-09-10 LAB — PRO BRAIN NATRIURETIC PEPTIDE: Pro Brain Natriuretic Peptide: <50 pg/mL

## 2024-09-10 LAB — TROPONIN T, HIGH SENSITIVITY: Troponin T High Sensitivity: <15 ng/L (ref 0–19)

## 2024-09-10 MED ORDER — IOHEXOL 350 MG/ML SOLN
100.0000 mL | Freq: Once | INTRAVENOUS | Status: AC | PRN
  Administered 2024-09-10: 100 mL via INTRAVENOUS

## 2024-09-10 MED ORDER — ACETAMINOPHEN 500 MG PO TABS
1000.0000 mg | ORAL_TABLET | Freq: Four times a day (QID) | ORAL | Status: DC
  Administered 2024-09-10 – 2024-09-11 (×4): 1000 mg via ORAL
  Filled 2024-09-10 (×2): qty 2

## 2024-09-10 MED ORDER — DICLOFENAC SODIUM 1 % EX GEL
4.0000 g | Freq: Four times a day (QID) | CUTANEOUS | Status: DC
  Administered 2024-09-10 – 2024-09-11 (×2): 4 g via TOPICAL
  Filled 2024-09-10: qty 100

## 2024-09-10 MED ORDER — NYSTATIN 100000 UNIT/GM EX POWD
1.0000 | Freq: Three times a day (TID) | CUTANEOUS | Status: DC
  Administered 2024-09-10 – 2024-09-11 (×2): 1 via TOPICAL
  Filled 2024-09-10: qty 15

## 2024-09-10 MED ORDER — MORPHINE SULFATE (PF) 4 MG/ML IV SOLN
4.0000 mg | Freq: Once | INTRAVENOUS | Status: AC
  Administered 2024-09-10: 4 mg via INTRAVENOUS
  Filled 2024-09-10: qty 1

## 2024-09-10 MED ORDER — PANTOPRAZOLE SODIUM 40 MG IV SOLR
40.0000 mg | Freq: Once | INTRAVENOUS | Status: AC
  Administered 2024-09-10: 40 mg via INTRAVENOUS
  Filled 2024-09-10: qty 10

## 2024-09-10 MED ORDER — HEPARIN (PORCINE) 25000 UT/250ML-% IV SOLN
1450.0000 [IU]/h | INTRAVENOUS | Status: DC
  Administered 2024-09-10 – 2024-09-11 (×2): 1500 [IU]/h via INTRAVENOUS
  Filled 2024-09-10: qty 250

## 2024-09-10 MED ORDER — ONDANSETRON HCL 4 MG/2ML IJ SOLN
4.0000 mg | Freq: Once | INTRAMUSCULAR | Status: AC
  Administered 2024-09-10: 4 mg via INTRAVENOUS
  Filled 2024-09-10: qty 2

## 2024-09-10 MED ORDER — PANTOPRAZOLE SODIUM 40 MG PO TBEC
40.0000 mg | DELAYED_RELEASE_TABLET | Freq: Every day | ORAL | Status: DC
  Administered 2024-09-10 – 2024-09-11 (×2): 40 mg via ORAL
  Filled 2024-09-10: qty 1

## 2024-09-10 MED ORDER — POLYETHYLENE GLYCOL 3350 17 G PO PACK
17.0000 g | PACK | Freq: Every day | ORAL | Status: DC
  Administered 2024-09-10 – 2024-09-11 (×2): 17 g via ORAL
  Filled 2024-09-10: qty 1

## 2024-09-10 MED ORDER — DIVALPROEX SODIUM 250 MG PO DR TAB
500.0000 mg | DELAYED_RELEASE_TABLET | Freq: Two times a day (BID) | ORAL | Status: DC
  Administered 2024-09-10 – 2024-09-11 (×2): 500 mg via ORAL
  Filled 2024-09-10: qty 2

## 2024-09-10 MED ORDER — SENNA 8.6 MG PO TABS
1.0000 | ORAL_TABLET | Freq: Every day | ORAL | Status: DC
  Administered 2024-09-10: 8.6 mg via ORAL
  Filled 2024-09-10: qty 1

## 2024-09-10 MED ORDER — DEUTETRABENAZINE ER 12 MG PO TB24
1.0000 | ORAL_TABLET | Freq: Every day | ORAL | Status: DC
  Administered 2024-09-11: 1 via ORAL

## 2024-09-10 MED ORDER — IRBESARTAN 300 MG PO TABS
300.0000 mg | ORAL_TABLET | Freq: Every day | ORAL | Status: DC
  Administered 2024-09-11: 300 mg via ORAL

## 2024-09-10 MED ORDER — AMLODIPINE BESYLATE-VALSARTAN 10-320 MG PO TABS: 1.0000 | ORAL_TABLET | Freq: Every day | ORAL | Status: DC

## 2024-09-10 MED ORDER — AMLODIPINE BESYLATE 5 MG PO TABS
10.0000 mg | ORAL_TABLET | Freq: Every day | ORAL | Status: DC
  Administered 2024-09-11: 10 mg via ORAL

## 2024-09-10 MED ORDER — HEPARIN BOLUS VIA INFUSION
5000.0000 [IU] | Freq: Once | INTRAVENOUS | Status: AC
  Administered 2024-09-10: 5000 [IU] via INTRAVENOUS
  Filled 2024-09-10: qty 5000

## 2024-09-10 NOTE — ED Notes (Signed)
 Delay in starting heparin  due to patient care in another room.

## 2024-09-10 NOTE — H&P (Addendum)
 "    Hospital Admission History and Physical Service Pager: [phone]  Patient name: Sally Wang Medical record number: [id number] Date of Birth: 10/13/1960 Age: 63 y.o. Gender: female  Primary Care Provider: Rea Raring, MD Consultants: None Code Status: FULL Preferred Emergency Contact:   Contact Information     Name Relation Home Work Mobile   Teddy, Pena [phone]  [phone]   Connell,terrence Son [phone]  [phone]      Other Contacts     Name Relation Home Work Mobile   Davis Daughter [phone]     Khara, Renaud Daughter [phone]  [phone]       Chief Complaint: abdominal pain, leg heaviness, SOB  Differential and Medical Decision Making:  Sally Wang is a 63 y.o. female with a PMH of schizoaffective disorder, obesity, hypertension presenting with abdominal pain (suprapubic/epigastric) and leg heaviness.   Differential for this patient's presentation of this includes  Pulmonary emboli: Most likely having been found on CTA chest/abdomen/pelvis and history of SOB on exertion and tachycardia GERD: Possible given current presentation and history with findings of hiatal hernia on CT Urinary tract infection: Possible given increased frequency and suprapubic pain, however, unlikely given UA Assessment & Plan Pulmonary emboli (HCC) Bilateral segmental and subsegmental PE incidentally seen on CTA chest/abdomen/pelvis which was ordered for rule out of aortic dissection.  Patient with period of increased immobility due to leg pain which is likely contributor with her history. S/p morphine , Zofran , and pantoprazole  in the ED.  5000 unit heparin  bolus initiated in the ED along with heparin  infusion. - Admit to FMTS, progressive unit, attending Dr. Donah - Vital signs per floor - Continue heparin  GGT - If remains stable after 24 hours, switch to DOAC - No need for repeat echo given no hemodynamic compromise and no signs of right heart strain  on CT Bilateral leg pain With 1+ pitting edema to mid shin. Unlikely decompensated CHF given recently normal pro-BNP, though her recent echo did show G1DD. Most likely referred pain from likely bilateral knee OA. - Voltaren  for knee pain - PT/OT starting tomorrow ideally once off heparin  infusion - Bilateral DVT ultrasound to assess for cause of lower extremity swelling and potential lower extremity clot - UA with 30 protein, we will obtain urine microalbumin/creatinine to evaluate for unlikely nephrotic disease, as this could also predispose to clots as above Epigastric pain Suspect this is most likely due to GERD given patient endorsing bad taste in her mouth and with evidence of hiatal hernia on CT. She is s/p cholecystectomy but could still have retained biliary stone. Lipase slightly elevated on admission to 57, unlikely pancreatitis at this time due to not >3x ULN.  S/p pantoprazole  in the ED. - Oral pantoprazole  40 mg daily for potential GERD - If epigastric pain does not improve, or if it worsens, can consider repeat lipase for evolving pancreatitis or further evaluation for retained biliary stone Suprapubic pain Unclear cause, also with some radiation to RLQ.  Endorses symptoms of increased urinary frequency without dysuria.  Low suspicion for UTI given UA. Unlikely appendicitis (not seen on CT) given no leukocytosis or fever. No signs of obstructive hernia on CT. Stooling normally, though she does have moderate stool on her CT. - Start miralax  and senna daily - Consider repeat UA if continuing - Could also consider RLQ US  to better characterize appendicitis Chronic health problem Hypertension - patient uses amlodipine  valsartan  10-320 mg outpatient, pharmacy alternative inpatient is amlodipine  10 mg and irbesartan  300 mg  Schizoaffective disorder - continue home Depakote  500 mg twice daily and Austedo  for TD Intertrigo - continue home nystatin  powder  FEN/GI: Regular VTE Prophylaxis:  Heparin  drip  Disposition: Progressive  History of Present Illness:  Sally Wang is a 63 y.o. female presenting with abdominal pain.  She came in due to a heavy weight feeling in her bilateral legs. She says it somewhat feels like neuropathy. She also had aching in her joints. She was also having abdominal pain with lots of a noises in the belly. The pain is lower abdomen, but she denies dysuria.  She does endorse increased urinary frequency.  No recent nausea or vomiting. She does endorse SOB and diaphoresis previously. She notes her BP is higher due to her pain, as well. No chest pain or SOB right now unless she is walking. Patient states that her leg pain has been there for about 5 months and has been getting worse over time.  She endorses swelling of her legs that she states has been there for 2 years.  Denies any recent long car/plane rides, but states she has been more immobile recently due to pain.    Her last bowel movement was yesterday she states it was soft which is normal for her. Patient does endorse having a bad taste in her mouth recently but denies any burning sensation in her chest.  In the ED, presented with abdominal/flank pain.  She was hypertensive up to 222/169 and tachycardic up to 114.  Troponin was less than 15, and a BNP was less than 50.  UA unremarkable except for glucose.  Lipase was elevated at 57.  Chest x-ray showed mild left basilar linear atelectasis.  CTA chest/abdomen/pelvis showed segmental and subsegmental PE bilaterally, aortic atherosclerosis without aneurysm or dissection, complex periumbilical hernia with nonobstructed bowel, bilateral pulmonary nodules up to 4 mm, small hiatal hernia, diverticulosis without diverticulitis.  EKG showed sinus tachycardia without ST elevation.  Patient was given morphine , Zofran , and pantoprazole .  Family medicine was paged for admission.  Review Of Systems: Per HPI  Pertinent Past Medical History: Obesity Uncontrolled  HTN Schizoaffective disorder Delusional disorder  Diastolic CHF Prediabetes Remainder reviewed in history tab.   Pertinent Past Surgical History: C-section x2  Cholecystectomy 2006 Umbilical hernia repair 2001 Remainder reviewed in history tab.   Pertinent Social History: Tobacco use: No Alcohol use: Socially Other Substance use: No Lives with husband, daughter  Pertinent Family History: CAD - father  Diabetes - mother  HTN - brother   Important Outpatient Medications: Amlodipine  valsartan  10-320 mg Austedo  12 mg daily (TD med) Depakote  500 mg twice daily  Haloperidol  decanoate inject 100 mg into muscle every 28 days Hydroxyzine 25 mg twice daily as needed Nystatin  powder Naproxen sodium to 220 mg daily as needed  Objective: BP (!) 149/84   Pulse 89   Temp 97.9 F (36.6 C) (Oral)   Resp 20   Ht 5' 6 (1.676 m)   Wt 133.8 kg   LMP 02/16/2015   SpO2 97%   BMI 47.61 kg/m  Exam: General: NAD, alert, conversant Eyes: EOM grossly intact with right exotropia, anicteric sclera Cardiovascular: RRR, no M/R/G, 1+ pitting edema in lower extremities bilaterally Respiratory: CTAB, normal work of breathing on room air Gastrointestinal: Soft, nondistended, very mild suprapubic tenderness to palpation MSK: Moves limbs grossly equally Neuro: No gross focal deficits Psych: Mood and affect appropriate  Labs:  CBC BMET  Recent Labs  Lab 09/09/24 2230  WBC 6.9  HGB 13.1  HCT 39.9  PLT 227   Recent Labs  Lab 09/09/24 2230  NA 137  K 3.9  CL 97*  CO2 28  BUN 15  CREATININE 0.99  GLUCOSE 201*  CALCIUM 9.3    Pertinent additional labs: Lipase 57, BNP < 50, troponin <15.   EKG: My own interpretation: Sinus tachycardia, no ST elevation, no bundle-branch blocks noted  Imaging Studies Performed:  Chest x-ray Impression from Radiologist: Mild left basilar linear atelectasis.   My Interpretation: No focal consolidation, no pleural effusion, no pneumothorax.  CTA  chest abdomen pelvis 1. Segmental and subsegmental pulmonary emboli bilaterally. 2. Aortic atherosclerosis without evidence of aneurysm or dissection. 3. Complex periumbilical hernia containing nonobstructed bowel. 4. Bilateral pulmonary nodules measuring up to 4 mm. No follow-up needed if patient is low-risk (and has no known or suspected primary neoplasm). Non-contrast chest CT can be considered in 12 months if patient is high-risk. This recommendation follows the consensus statement: Guidelines for Management of Incidental Pulmonary Nodules Detected on CT Images: From the Fleischner Society 2017; Radiology 2017; 284:228-243. 5. Small hiatal hernia. 6. Diverticulosis without diverticulitis.  Lennie Raguel MATSU., DO 09/10/2024, 5:59 PM PGY-1, [facility]  FPTS Intern pager: [phone], text pages welcome Secure chat group [facility] Teaching Service   I agree with the assessment and plan as documented above.  Stuart Redo, MD PGY-3, [facility]   "

## 2024-09-10 NOTE — Hospital Course (Addendum)
 Sally Wang is a 63 y.o. female who was admitted to the [facility] Medicine Teaching Service at [facility] for abdominal pain and bilateral pulmonary emboli. Hospital course is outlined below by problem.   Bilateral pulmonary emboli Presented with shortness of breath with exertion, bilateral lower extremity swelling, tachycardia to the 110s.  She had a recent history of immobility due to leg pain.  Labs unremarkable.  CTA chest abdomen pelvis performed given concern for dissection below which demonstrated PEs.  She was placed on heparin  drip, and given her very low risk PESI score and hemodynamic stability, she was quickly transitioned to DOAC.  She was discharged on 3 months of anticoagulation given this was likely provoked.***  Epigastric and suprapubic abdominal pain Presented with worsening pain mostly with movement and with pressure.  Pain also radiating to the back.  No nausea or vomiting.  No fevers.  Labs and UA unremarkable for cause.  CTA collected which did not show cause for abdominal pain, but it did show presence of a small hiatal hernia and a nonobstructed umbilical hernia.  Patient endorsed bad taste in her mouth, and given presence of hiatal hernia, she was treated with PPI for potential GERD.***  Other conditions that were chronic and stable: hypertension, schizoaffective disorder, prediabetes.  Issues for follow up: Ensure continue DOAC use Consider referral to GI if abdominal pain continues Bilateral pulmonary nodules measuring up to 4 mm. No follow-up needed if patient is low-risk (and has no known or suspected primary neoplasm). Non-contrast chest CT can be considered in 12 months if patient is high-risk.

## 2024-09-10 NOTE — ED Provider Notes (Addendum)
 Received patient from signout from previous provider pending CT dissection study.  See their note.  In short, patient presents to emergency department for evaluation of epigastric abdominal pain that radiates into back that started few days ago.  Described as severe sharp stabbing and intermittent.  Endorses associated shortness of breath and chest pain.  ED reassuring.  Troponin negative.  BNP negative UA without infection.  Chest x-ray without pneumonia nor fluid overload.  Provided morphine , Protonix , Zofran  for pain  CT dissection study notable for bilateral segmental and subsegmental PEs without right heart strain.  Troponins WNL.  No sign of right heart strain on EKG.  Started patient on heparin  and reached out to family medicine for admission   Physical Exam  BP (!) 157/100   Pulse 100   Temp 97.7 F (36.5 C)   Resp 18   Ht 5' 6 (1.676 m)   Wt 133.8 kg   LMP 02/16/2015   SpO2 98%   BMI 47.61 kg/m  .Critical Care  Performed by: Minnie Tinnie BRAVO, PA Authorized by: Minnie Tinnie BRAVO, PA   Critical care provider statement:    Critical care time (minutes):  30   Critical care was necessary to treat or prevent imminent or life-threatening deterioration of the following conditions: PE, administration of heparin .   Critical care was time spent personally by me on the following activities:  Development of treatment plan with patient or surrogate, discussions with consultants, evaluation of patient's response to treatment, examination of patient, ordering and review of laboratory studies, ordering and review of radiographic studies, ordering and performing treatments and interventions, pulse oximetry, re-evaluation of patient's condition and review of old charts   Care discussed with: admitting provider      ED Course / MDM   Clinical Course as of 09/10/24 1511  Sun Sep 10, 2024  1457 Epigastric/chest pain radiating towards the back. Also stating that her kidneys hurt. Will get labs  and dissection study. -SFM [SM]    Clinical Course User Index [SM] Hoy Nidia FALCON, PA-C   Medical Decision Making Amount and/or Complexity of Data Reviewed Labs: ordered. Radiology: ordered.  Risk Prescription drug management. Decision regarding hospitalization.   Consulted family medicine resident Aloha Redo, discussed ED workup and disposition.  They accept patient for admission.  See their note    Minnie Tinnie BRAVO, PA 09/10/24 1701    Lenor Hollering, MD 09/10/24 1728    Minnie Tinnie BRAVO, PA 09/10/24 1752    Lenor Hollering, MD 09/10/24 [phone]

## 2024-09-10 NOTE — Progress Notes (Signed)
 PHARMACY - ANTICOAGULATION CONSULT NOTE  Pharmacy Consult for heparin  Indication: pulmonary embolus  Allergies[1]  Patient Measurements: Height: 5' 6 (167.6 cm) Weight: 133.8 kg (295 lb) IBW/kg (Calculated) : 59.3 HEPARIN  DW (KG): 92  Vital Signs: Temp: 97.9 F (36.6 C) (12/28 1530) Temp Source: Oral (12/28 1530) BP: 157/100 (12/28 1330) Pulse Rate: 100 (12/28 1330)  Labs: Recent Labs    09/09/24 2230  HGB 13.1  HCT 39.9  PLT 227  CREATININE 0.99    Estimated Creatinine Clearance: 81.8 mL/min (by C-G formula based on SCr of 0.99 mg/dL).   Medical History: Past Medical History:  Diagnosis Date   Hypertension    Neuromuscular disorder (HCC)    Tremors- see's Neurologist   Schizoaffective disorder (HCC)      Assessment: 41 YOF presenting with SOB/CP, CT angio chest shows PE, she is not on anticoagulation PTA, CBC wnl  Goal of Therapy:  Heparin  level 0.3-0.7 units/ml Monitor platelets by anticoagulation protocol: Yes   Plan:  Heparin  5000 units IV x 1, and gtt at 1500 units/hr F/u 6 hour heparin  level F/u long term AC plan  Dorn Poot, PharmD, [facility] Clinical Pharmacist ED Pharmacist Phone # [phone] 09/10/2024 3:47 PM      [1] No Known Allergies

## 2024-09-10 NOTE — Assessment & Plan Note (Signed)
 With 1+ pitting edema to mid shin. Unlikely decompensated CHF given recently normal pro-BNP, though her recent echo did show G1DD. Most likely referred pain from likely bilateral knee OA. - Voltaren  for knee pain - PT/OT starting tomorrow ideally once off heparin  infusion - Bilateral DVT ultrasound to assess for cause of lower extremity swelling and potential lower extremity clot - UA with 30 protein, we will obtain urine microalbumin/creatinine to evaluate for unlikely nephrotic disease, as this could also predispose to clots as above

## 2024-09-10 NOTE — Assessment & Plan Note (Addendum)
 Hypertension - patient uses amlodipine  valsartan  10-320 mg outpatient, pharmacy alternative inpatient is amlodipine  10 mg and irbesartan  300 mg Schizoaffective disorder - continue home Depakote  500 mg twice daily and Austedo  for TD Intertrigo - continue home nystatin  powder

## 2024-09-10 NOTE — ED Notes (Signed)
 Dinner tray ordered.

## 2024-09-10 NOTE — ED Provider Notes (Signed)
 "  EMERGENCY DEPARTMENT AT [facility] Provider Note   CSN: [id number] Arrival date & time: 09/09/24  2146     Patient presents with: Abdominal Pain and Flank Pain   Sally Wang is a 63 y.o. female.  with a history of hypertension presents today with epigastric abdominal pain radiating to the back that started a few days ago.  She describes the pain as severe, sharp, stabbing in nature.  Patient notes this pain is intermittent.  Denies any falls or injuries.  She also endorses shortness of breath and chest pain that does not radiate.  Patient takes amlodipine  and valsartan  for blood pressure and did take it today morning.  Patient has had chronic bilateral leg swelling however it has been worsening she denies history of heart failure.  She had an echo done on 08/17/2024 which revealed EF of 65 to 70%.  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 patient has been following up with neurology regarding this.  Patient denies fever, chills, cough, dizziness, weakness, numbness, headaches, hemoptysis, blurry vision, tingling, nausea, vomiting, diarrhea, bowel or urinary incontinence, blood in urine, dysuria, or any other symptoms at this time.  History of hernia repair.  No history of blood clots.  No injuries no falls. No pain with deep breaths.       Abdominal Pain Flank Pain Associated symptoms include abdominal pain.       Prior to Admission medications  Medication Sig Start Date End Date Taking? Authorizing Provider  acetaminophen  (TYLENOL ) 325 MG tablet Take 650 mg by mouth every 6 (six) hours as needed.    [provider]  ALPHA LIPOIC ACID PO Take 600 mg by mouth daily.    [provider]  amLODipine -valsartan  (EXFORGE ) 10-320 MG tablet Take 1 tablet by mouth daily. 08/09/24   Adele Song, MD  [facility]  XR 12 MG TB24 Take 1 tablet by mouth daily. 08/29/24   [provider]  Blood Pressure Monitoring (ADULT BLOOD PRESSURE CUFF LG) KIT Please monitor your blood pressure daily and keep a log to bring to your appointments 07/04/24   Hindel, Song, MD  Blood Pressure Monitoring (ADULT BLOOD PRESSURE CUFF LG) KIT 1 each by Does not apply route daily. 08/22/24   Adele Song, MD  Calcium Carb-Cholecalciferol (CALCIUM + VITAMIN D3 PO) Take by mouth.    [provider]  cyclobenzaprine  (FLEXERIL ) 5 MG tablet Take 1 tablet (5 mg total) by mouth 3 (three) times daily as needed for muscle spasms. 08/09/24   Adele Song, MD  divalproex  (DEPAKOTE ) 500 MG DR tablet Take 500 mg by mouth 2 (two) times daily. 06/10/24   [provider]  Divalproex  Sodium (DEPAKOTE  PO) Take by mouth as needed (for anxiety).    [provider]  haloperidol  decanoate (HALDOL  DECANOATE) 100 MG/ML injection Inject 100 mg into the muscle every 28 (twenty-eight) days. Once a month 08/30/24   [provider]  Haloperidol  Lactate (HALDOL  IJ) Inject as directed every 30 (thirty) days.    [provider]  hydrOXYzine (ATARAX) 25 MG tablet Take 25 mg by mouth 2 (two) times daily as needed. 06/10/24   [provider]  MAGNESIUM  MALATE PO Take 3,750 mg by mouth 2 (two) times daily with a meal.    [provider]  MALIC ACID PO Take by mouth.    [provider]  naproxen sodium (ALEVE) 220 MG tablet Take 220 mg by  mouth daily as needed.    [provider]  NON FORMULARY daily. Bowel and bladder    [provider]  nystatin  (MYCOSTATIN /NYSTOP ) powder Apply 1 Application topically 3 (three) times daily. 08/29/24   Adele Song, MD  OVER THE COUNTER MEDICATION D[place] root liquid drops, [place]-natural herb    [provider]    Allergies: Patient has no known allergies.    Review of Systems  Gastrointestinal:  Positive for abdominal pain.  Genitourinary:  Positive for flank pain.    Updated  Vital Signs BP (!) 157/100   Pulse 100   Temp 97.7 F (36.5 C)   Resp 18   Ht 5' 6 (1.676 m)   Wt 133.8 kg   LMP 02/16/2015   SpO2 98%   BMI 47.61 kg/m   Physical Exam Vitals and nursing note reviewed.  Constitutional:      General: She is not in acute distress.    Appearance: She is well-developed. She is obese.  HENT:     Head: Normocephalic and atraumatic.  Eyes:     Conjunctiva/sclera: Conjunctivae normal.  Cardiovascular:     Rate and Rhythm: Normal rate and regular rhythm.     Heart sounds: No murmur heard. Pulmonary:     Effort: Pulmonary effort is normal. No respiratory distress.     Breath sounds: Normal breath sounds. No decreased breath sounds, wheezing, rhonchi or rales.  Abdominal:     Palpations: Abdomen is soft.     Tenderness: There is abdominal tenderness in the epigastric area. There is no right CVA tenderness, left CVA tenderness, guarding or rebound. Negative signs include Murphy's sign, Rovsing's sign and McBurney's sign.  Musculoskeletal:        General: No swelling.     Cervical back: Normal and neck supple.     Thoracic back: Normal.     Lumbar back: Tenderness present. No swelling, deformity or signs of trauma. Normal range of motion. Negative right straight leg raise test and negative left straight leg raise test.     Comments: Pitting edema to bilateral lower extremity  Skin:    General: Skin is warm and dry.     Capillary Refill: Capillary refill takes less than 2 seconds.  Neurological:     Mental Status: She is alert and oriented to person, place, and time.     Sensory: Sensation is intact.     Deep Tendon Reflexes: Reflexes are normal and symmetric.     Comments: BUE resting tremor Mental Status:  Alert, oriented, thought content appropriate, able to give a coherent history. Speech fluent without evidence of aphasia. Able to follow 2 step commands without difficulty.  Cranial Nerves:  II:  Peripheral visual fields grossly normal, pupils  equal, round, reactive to light III,IV, VI: ptosis not present, extra-ocular motions intact bilaterally  V,VII: smile symmetric, facial light touch sensation equal VIII: hearing grossly normal to voice  X: uvula elevates symmetrically  XI: bilateral shoulder shrug symmetric and strong XII: midline tongue extension without fassiculations Motor:  Normal tone. 5/5 in upper and lower extremities bilaterally including strong and equal grip strength and dorsiflexion/plantar flexion Sensory: Pinprick and light touch normal in all extremities.  Deep Tendon Reflexes: 2+ and symmetric in the biceps and patella Cerebellar: Abnormal finger-to-nose with bilateral upper extremities due to tremors   Psychiatric:        Mood and Affect: Mood normal.     (all labs ordered are listed, but only abnormal results are displayed)  Labs Reviewed  URINALYSIS, ROUTINE W REFLEX MICROSCOPIC - Abnormal; Notable for the following components:      Result Value   Glucose, UA 50 (*)    Protein, ur 30 (*)    All other components within normal limits  LIPASE, BLOOD - Abnormal; Notable for the following components:   Lipase 57 (*)    All other components within normal limits  COMPREHENSIVE METABOLIC PANEL WITH GFR - Abnormal; Notable for the following components:   Chloride 97 (*)    Glucose, Bld 201 (*)    All other components within normal limits  CBC  PRO BRAIN NATRIURETIC PEPTIDE  TROPONIN T, HIGH SENSITIVITY    EKG: None  Radiology: DG Chest Portable 1 View Result Date: 09/10/2024 CLINICAL DATA:  Generalized abdominal pain with bilateral flank pain and burning with urination. EXAM: PORTABLE CHEST 1 VIEW COMPARISON:  November 17, 2022 FINDINGS: The heart size and mediastinal contours are within normal limits. Mild linear atelectasis is noted within the left lung base. No acute infiltrate, pleural effusion or pneumothorax is identified. Multilevel degenerative changes are seen throughout the thoracic spine.  IMPRESSION: Mild left basilar linear atelectasis. Electronically Signed   By: Suzen Dials M.D.   On: 09/10/2024 14:41     Procedures   Medications Ordered in the ED - No data to display                                  Medical Decision Making Amount and/or Complexity of Data Reviewed Labs: ordered.     This patient presents to the ED for concern of abdominal pain radiating to the back, shortness of breath, chest pain. this involves an extensive number of treatment options, and is a complaint that carries with it a high risk of complications and morbidity.  The differential diagnosis includes AAA, mesenteric ischemia, appendicitis, diverticulitis, DKA, gastroenteritis, nephrolithiasis, pancreatitis, constipation, UTI, bowel obstruction, biliary disease, IBD, PUD, hepatitis, ACS, PE, CHF    Co morbidities / Chronic conditions that complicate the patient evaluation  Hypertension   Additional history obtained:  Additional history obtained from EMR External records from outside source obtained and reviewed including echocardiogram from 08/17/2024 which revealed EF of 65 to 70% and mild LVH.   Lab Tests:  I Ordered, and personally interpreted labs.  The pertinent results include: Elevated glucose at 201, lipase elevated at 57 but improving from previous visit, otherwise no signs of leukocytosis or signs of infection and urinalysis. Pending BNP and troponin.  Imaging Studies ordered:  I ordered imaging studies including chest x-ray and CT angio I independently visualized and interpreted imaging which is pending  I agree with the radiologist interpretation   Cardiac Monitoring: / EKG:  The patient was maintained on a cardiac monitor.  I personally viewed and interpreted the cardiac monitored which showed an underlying rhythm of: Sinus tachycardia    Dispostion: Patient with a history of hypertension presents today for severe abdominal pain that radiates to the back.   She also notes shortness of breath and chest pain.  On exam she is alert and oriented and not in distress.  She is tachycardic and hypertensive.  Lung and heart exam benign.  Patient did take her blood pressure medication this morning but notes that her blood pressure has been elevated for the past few days.  She also has bilateral resting tremor on exam for which she is following up with neurology.  Patient endorses bilateral lower extremity pitting edema.  Echocardiogram from 08/17/2024 revealed EF of 65 to 70% and mild LVH. Abdomen is tender to palpation in the epigastric with no guarding, rigidity, rebound tenderness.  No pulsatile masses appreciated.  Ordered CT angiography for evaluation of possible dissection.  Pending labs and imaging.  Patient not hypoxic.  Pending EKG.     3:00 PM Care of Sally P Simmstransferred to PA [place] at the end of my shift as the patient will require reassessment once labs/imaging have resulted. Patient presentation, ED course, and plan of care discussed with review of all pertinent labs and imaging. Please see his/her note for further details regarding further ED course and disposition. This may be altered or completely changed at the discretion of the oncoming team pending results of further workup.        Final diagnoses:  None    ED Discharge Orders     None          Braxton Dubois, PA-C 09/10/24 1451    Tegeler, Lonni PARAS, MD 09/10/24 1546  "

## 2024-09-10 NOTE — Assessment & Plan Note (Addendum)
 Unclear cause, also with some radiation to RLQ.  Endorses symptoms of increased urinary frequency without dysuria.  Low suspicion for UTI given UA. Unlikely appendicitis (not seen on CT) given no leukocytosis or fever. No signs of obstructive hernia on CT. Stooling normally, though she does have moderate stool on her CT. - Start miralax  and senna daily - Consider repeat UA if continuing - Could also consider RLQ US  to better characterize appendicitis

## 2024-09-10 NOTE — Plan of Care (Signed)
 FMTS Brief Progress Note  S: Sally Wang is a 63 y.o. female with a pertinent history of schizoaffective disorder, tardive dyskinesia, obesity, and hypertension presenting with abdominal pain and leg heaviness, admitted for bilateral pulmonary emboli noted on CTA chest.  Saw patient at bedside with Dr. Stoney.  The patient reports leg heaviness continuing and some lower back pain, rating it as 4/10 and states she feels significantly better compared to admission.  O: BP (!) 137/92   Pulse (!) 113   Temp 98.5 F (36.9 C) (Oral)   Resp (!) 28   Ht 5' 6 (1.676 m)   Wt 133.8 kg   LMP 02/16/2015   SpO2 98%   BMI 47.61 kg/m   General: Age-appropriate, resting comfortably in bed, NAD, alert and at baseline. Cardiovascular: Borderline tachycardic rate and regular rhythm. Normal S1/S2. No murmurs, rubs, or gallops appreciated. 2+ radial pulses. Pulmonary: Clear bilaterally to ascultation. No wheezes, crackles, or rhonchi. Normal WOB on room air. Abdominal: No suprapubic tenderness to palpation, no TTP in all abdominal quadrants. No rebound or guarding. No HSM. Skin: Warm and dry. Neuro: Mild tremor, most noticeable in L hand c/w known tardive dyskinesia. Extremities: 1+ pitting edema to knees equal bilaterally, no TTP of LE. Capillary refill <2 seconds.   A/P: Pulmonary emboli No respiratory distress.  Does have some increased tachycardia, but BP remains stable and patient reports feeling better overall.  Does have LE edema equal bilaterally. - Cont hepartin gtt, consider transition to DOAC tomorrow  Bilateral leg pain/heaviness Need to rule out DVT. - f/u vascular DVT US  BLE - PT/OT pending US  - Continue Voltaren  QID for knees - f/u microalbumin/Cr for nephrotic syndrome workup  Suprapubic pain Not noted on repeat exam.  UA reassuring. - Continue MiraLAX  and senna  - Orders reviewed; labs for AM ordered including BMP and CBC - If condition changes, plan includes repeat  imaging and escalation of care  Toma Matas, MD 09/10/2024, 9:05 PM PGY-2, [facility] Night Resident  Please page [phone] with questions

## 2024-09-10 NOTE — Assessment & Plan Note (Addendum)
 Bilateral segmental and subsegmental PE incidentally seen on CTA chest/abdomen/pelvis which was ordered for rule out of aortic dissection.  Patient with period of increased immobility due to leg pain which is likely contributor with her history. S/p morphine , Zofran , and pantoprazole  in the ED.  5000 unit heparin  bolus initiated in the ED along with heparin  infusion. - Admit to FMTS, progressive unit, attending Dr. Donah - Vital signs per floor - Continue heparin  GGT - If remains stable after 24 hours, switch to DOAC - No need for repeat echo given no hemodynamic compromise and no signs of right heart strain on CT

## 2024-09-10 NOTE — Assessment & Plan Note (Addendum)
 Suspect this is most likely due to GERD given patient endorsing bad taste in her mouth and with evidence of hiatal hernia on CT. She is s/p cholecystectomy but could still have retained biliary stone. Lipase slightly elevated on admission to 57, unlikely pancreatitis at this time due to not >3x ULN.  S/p pantoprazole  in the ED. - Oral pantoprazole  40 mg daily for potential GERD - If epigastric pain does not improve, or if it worsens, can consider repeat lipase for evolving pancreatitis or further evaluation for retained biliary stone

## 2024-09-10 NOTE — ED Notes (Signed)
 Pt medication Dutetrabenazine is not stocked in pharmacy. Notified family that they should bring from home.

## 2024-09-10 NOTE — Discharge Instructions (Signed)
 Dear Sally Wang,   Thank you for letting us  participate in your care! You were admitted to the hospital for blood clots in your lungs. You were started on a blood thinner.   POST-HOSPITAL & CARE INSTRUCTIONS We recommend following up with your PCP within 1 week from being discharged from the hospital. Please let PCP/Specialists know of any changes in medications that were made which you will be able to see in the medications section of this packet.  DOCTOR'S APPOINTMENTS & FOLLOW UP Future Appointments  Date Time Provider Department Center  09/20/2024  1:00 PM Armbruster, Elspeth SHAUNNA, MD [facility]  09/25/2024  3:10 PM Adele Song, MD [facility]  11/02/2024  2:15 PM Patel, Donika K, DO [facility] None    Thank you for choosing [facility]! Take care and be well!  Family Medicine Teaching Service Inpatient Team [place]  [facility]  [address] [place], [place] [address] [phone]

## 2024-09-11 ENCOUNTER — Other Ambulatory Visit (HOSPITAL_COMMUNITY): Payer: Self-pay

## 2024-09-11 ENCOUNTER — Telehealth (HOSPITAL_COMMUNITY): Payer: Self-pay | Admitting: Pharmacy Technician

## 2024-09-11 ENCOUNTER — Observation Stay (HOSPITAL_BASED_OUTPATIENT_CLINIC_OR_DEPARTMENT_OTHER)

## 2024-09-11 DIAGNOSIS — M79605 Pain in left leg: Secondary | ICD-10-CM

## 2024-09-11 DIAGNOSIS — M79604 Pain in right leg: Secondary | ICD-10-CM

## 2024-09-11 DIAGNOSIS — R609 Edema, unspecified: Secondary | ICD-10-CM

## 2024-09-11 DIAGNOSIS — I2699 Other pulmonary embolism without acute cor pulmonale: Secondary | ICD-10-CM | POA: Diagnosis not present

## 2024-09-11 LAB — CBC
HCT: 37.6 % (ref 36.0–46.0)
Hemoglobin: 12.1 g/dL (ref 12.0–15.0)
MCH: 30.7 pg (ref 26.0–34.0)
MCHC: 32.2 g/dL (ref 30.0–36.0)
MCV: 95.4 fL (ref 80.0–100.0)
Platelets: 203 K/uL (ref 150–400)
RBC: 3.94 MIL/uL (ref 3.87–5.11)
RDW: 12.5 % (ref 11.5–15.5)
WBC: 5.8 K/uL (ref 4.0–10.5)
nRBC: 0 % (ref 0.0–0.2)

## 2024-09-11 LAB — BASIC METABOLIC PANEL WITH GFR
Anion gap: 9 (ref 5–15)
BUN: 17 mg/dL (ref 8–23)
CO2: 28 mmol/L (ref 22–32)
Calcium: 9.6 mg/dL (ref 8.9–10.3)
Chloride: 100 mmol/L (ref 98–111)
Creatinine, Ser: 0.98 mg/dL (ref 0.44–1.00)
GFR, Estimated: >60 mL/min
Glucose, Bld: 109 mg/dL — ABNORMAL HIGH (ref 70–99)
Potassium: 4.1 mmol/L (ref 3.5–5.1)
Sodium: 137 mmol/L (ref 135–145)

## 2024-09-11 LAB — HIV ANTIBODY (ROUTINE TESTING W REFLEX): HIV Screen 4th Generation wRfx: NONREACTIVE

## 2024-09-11 LAB — HEPARIN LEVEL (UNFRACTIONATED): Heparin Unfractionated: 0.65 [IU]/mL (ref 0.30–0.70)

## 2024-09-11 MED ORDER — APIXABAN 5 MG PO TABS
10.0000 mg | ORAL_TABLET | Freq: Two times a day (BID) | ORAL | Status: DC
  Administered 2024-09-11: 10 mg via ORAL
  Filled 2024-09-11: qty 2

## 2024-09-11 MED ORDER — APIXABAN 5 MG PO TABS: 5.0000 mg | ORAL_TABLET | Freq: Two times a day (BID) | ORAL | Status: DC

## 2024-09-11 MED ORDER — APIXABAN (ELIQUIS) EDUCATION KIT FOR DVT/PE PATIENTS
PACK | Freq: Once | Status: DC
  Filled 2024-09-11: qty 1

## 2024-09-11 MED ORDER — PANTOPRAZOLE SODIUM 40 MG PO TBEC
40.0000 mg | DELAYED_RELEASE_TABLET | Freq: Every day | ORAL | 0 refills | Status: AC
  Filled 2024-09-11: qty 30, 30d supply, fill #0

## 2024-09-11 MED ORDER — APIXABAN 5 MG PO TABS
5.0000 mg | ORAL_TABLET | Freq: Two times a day (BID) | ORAL | 1 refills | Status: AC
  Filled 2024-09-11 – 2024-10-02 (×3): qty 60, 30d supply, fill #0

## 2024-09-11 MED ORDER — APIXABAN (ELIQUIS) VTE STARTER PACK (10MG AND 5MG)
ORAL_TABLET | ORAL | 0 refills | Status: AC
  Filled 2024-09-11: qty 74, 30d supply, fill #0

## 2024-09-11 NOTE — ED Notes (Signed)
 Changed pt's. Bed linens

## 2024-09-11 NOTE — Progress Notes (Signed)
 Lower extremity venous duplex completed. Please see CV Procedures for preliminary results.  Initial findings reported to Eleanor Mandes, [facility].  Garnette Rockers, RVT 09/11/2024 2:17 PM

## 2024-09-11 NOTE — ED Notes (Signed)
 Help patient with the bedside toilet patient is now back in bed with family at bedside and call bell in reach

## 2024-09-11 NOTE — Assessment & Plan Note (Deleted)
 With 1+ pitting edema to mid shin. Unlikely decompensated CHF given recently normal pro-BNP, though her recent echo did show G1DD. Most likely referred pain from likely bilateral knee OA. - Voltaren  for knee pain - PT/OT starting tomorrow ideally once off heparin  infusion - Bilateral DVT ultrasound to assess for cause of lower extremity swelling and potential lower extremity clot - UA with 30 protein, we will obtain urine microalbumin/creatinine to evaluate for unlikely nephrotic disease, as this could also predispose to clots as above

## 2024-09-11 NOTE — Assessment & Plan Note (Deleted)
 Intermittently tachycardic, BP stable and on room air.  Bilateral segmental and subsegmental PE incidentally seen on CTA chest/abdomen/pelvis which was ordered for rule out of aortic dissection.  Likely provoked 2/2 decreased mobility from leg pain. - Continue heparin  GGT - If remains stable after 24 hours, switch to DOAC this afternoon

## 2024-09-11 NOTE — Progress Notes (Signed)
 PHARMACY - ANTICOAGULATION Pharmacy Consult for heparin  Indication: pulmonary embolus Brief A/P: Heparin  level within goal range Continue Heparin  at current rate   Allergies[1]  Patient Measurements: Height: 5' 6 (167.6 cm) Weight: 133.8 kg (295 lb) IBW/kg (Calculated) : 59.3 HEPARIN  DW (KG): 92  Vital Signs: Temp: 98.5 F (36.9 C) (12/28 2031) Temp Source: Oral (12/28 2031) BP: 106/62 (12/28 2200) Pulse Rate: 91 (12/28 2200)  Labs: Recent Labs    09/09/24 2230 09/10/24 2248  HGB 13.1  --   HCT 39.9  --   PLT 227  --   HEPARINUNFRC  --  0.44  CREATININE 0.99  --     Estimated Creatinine Clearance: 81.8 mL/min (by C-G formula based on SCr of 0.99 mg/dL).  Assessment: 63 y.o. female with PE for heparin   Goal of Therapy:  Heparin  level 0.3-0.7 units/ml Monitor platelets by anticoagulation protocol: Yes   Plan:  No change to heparin   Follow-up am labs.  Cathlyn Arrant, PharmD, BCPS  09/11/2024 12:07 AM       [1] No Known Allergies

## 2024-09-11 NOTE — Assessment & Plan Note (Deleted)
 Most likely GERD possibly 2/2 small hiatal hernia.  Lipase mildly elevated on admission, can consider repeat of no improvement on pantoprazole . - Continue pantoprazole  40 mg p.o. daily for GERD - If epigastric pain does not improve, or if it worsens, can consider repeat lipase for evolving pancreatitis or further evaluation for retained biliary stone

## 2024-09-11 NOTE — Assessment & Plan Note (Deleted)
 CTAP unremarkable, most likely 2/2 stool burden. *** - Start miralax  and senna daily - Consider repeat UA if continuing - Could also consider RLQ US  to better characterize appendicitis

## 2024-09-11 NOTE — Care Management Obs Status (Signed)
 MEDICARE OBSERVATION STATUS NOTIFICATION   Patient Details  Name: Sally Wang MRN: [id number] Date of Birth: 13-Dec-1960   Medicare Observation Status Notification Given:  Yes    Debarah Saunas, RN 09/11/2024, 10:08 AM

## 2024-09-11 NOTE — Discharge Summary (Addendum)
 "  Family Medicine Teaching [facility] Discharge Summary  Patient name: Sally Wang Medical record number: [id number] Date of birth: [birth year] Age: 63 y.o. Gender: female Date of Admission: 09/09/2024  Date of Discharge: 09/11/2024 Admitting Physician: Raguel KANDICE Lee, DO  Primary Care Provider: Adele Song, MD Consultants: None  Indication for Hospitalization: Bilateral PE  Discharge Diagnoses/Problem List:  Principal Problem for Admission: Bilateral pulmonary embolism Other Problems addressed during stay:  Principal Problem:   Pulmonary emboli [facility]) Active Problems:   Epigastric pain   L. Popliteal DVT   Suprapubic pain   Bilateral leg pain   Brief Hospital Course:  Sally Wang is a 63 y.o. female who was admitted to the [facility] Medicine Teaching Service at [facility] for abdominal pain and bilateral pulmonary emboli. Hospital course is outlined below by problem.   Bilateral Provoked pulmonary emboli  L Popliteal Vein DVT Presented with shortness of breath with exertion, bilateral lower extremity swelling, tachycardia to the 110s.  She had a recent history of immobility due to leg pain.  Labs unremarkable.  CTA chest abdomen pelvis performed given concern for dissection below which demonstrated PEs.  She was placed on heparin  drip, and given her very low risk PESI score and hemodynamic stability, she was quickly transitioned to DOAC.  She was discharged on 3-6 months of anticoagulation given this was likely provoked from immobility (last dose 12/09/24), can consider increasing duration of treatment given multiple clots.  Bilateral lower extremity ultrasound found L popliteal vein DVT.  Epigastric and suprapubic abdominal pain  GERD Presented with worsening pain mostly with movement and with pressure.  Pain also radiating to the back.  No nausea or vomiting.  No fevers.  Labs and UA unremarkable for cause.  CTA collected which did not show cause for abdominal pain, but it did show  presence of a small hiatal hernia and a nonobstructed umbilical hernia.  Patient endorsed bad taste in her mouth, and given presence of hiatal hernia, she was treated with PPI for potential GERD and symptoms improved.   Other conditions that were chronic and stable: hypertension, schizoaffective disorder, prediabetes.  Issues for follow up: Ensure continue DOAC use, consider 6 mo course given concurrent popliteal DVT and PEs Consider updating cancer screening such as colonoscopy.  Consider referral to GI if abdominal pain continues, started on pantoprazole , provide instruction regarding GERD and avoid prolonged PPI therapy. Bilateral pulmonary nodules measuring up to 4 mm. No follow-up needed if patient is low-risk (and has no known or suspected primary neoplasm). Non-contrast chest CT can be considered in 12 months if patient is high-risk.    Results/Tests Pending at Time of Discharge:  Unresulted Labs (From admission, onward)     Start     Ordered   09/11/24 0500  CBC  Daily,   R     Placed in And Linked Group   09/10/24 1549   09/10/24 1754  Microalbumin / creatinine urine ratio  Add-on,   AD        09/10/24 1753             Disposition: Home  Discharge Condition: Stable  Discharge Exam:  Vitals:   09/11/24 1445 09/11/24 1514  BP: 139/88   Pulse: (!) 109   Resp: (!) 23   Temp:  97.9 F (36.6 C)  SpO2: 97%    General: Resting comfortably in no acute distress  cardiovascular: Regular rate rhythm, normal S1 and S2, no murmurs rubs or gallops Respiratory: Clear to  auscultation bilaterally, no wheezes or crackles Abdomen: Obese abdomen, active bowel sounds, nondistended, nontender Extremities: 1+ pitting edema proximal 1/3 tibia, 1+ pitting edema of bilateral hands  Significant Procedures: None  Significant Labs and Imaging:  Recent Labs  Lab 09/09/24 2230 09/11/24 0539  WBC 6.9 5.8  HGB 13.1 12.1  HCT 39.9 37.6  PLT 227 203   Recent Labs  Lab  09/09/24 2230 09/11/24 0539  NA 137 137  K 3.9 4.1  CL 97* 100  CO2 28 28  GLUCOSE 201* 109*  BUN 15 17  CREATININE 0.99 0.98  CALCIUM 9.3 9.6  ALKPHOS 104  --   AST 36  --   ALT 26  --   ALBUMIN  4.1  --    CXR 12/28 IMPRESSION: Mild left basilar linear atelectasis.  CT angio chest/abdomen/pelvis 12/28 IMPRESSION: 1. Segmental and subsegmental pulmonary emboli bilaterally. 2. Aortic atherosclerosis without evidence of aneurysm or dissection. 3. Complex periumbilical hernia containing nonobstructed bowel. 4. Bilateral pulmonary nodules measuring up to 4 mm. No follow-up needed if patient is low-risk (and has no known or suspected primary neoplasm). Non-contrast chest CT can be considered in 12 months if patient is high-risk. This recommendation follows the consensus statement: Guidelines for Management of Incidental Pulmonary Nodules Detected on CT Images: From the Fleischner Society 2017; Radiology 2017; 284:228-243. 5. Small hiatal hernia. 6. Diverticulosis without diverticulitis.  Discharge Medications:  Allergies as of 09/11/2024   No Known Allergies      Medication List     TAKE these medications    acetaminophen  325 MG tablet Commonly known as: TYLENOL  Take 650 mg by mouth every 6 (six) hours as needed.   amLODipine -valsartan  10-320 MG tablet Commonly known as: Exforge  Take 1 tablet by mouth daily.   Austedo  XR 12 MG Tb24 Generic drug: Deutetrabenazine  ER Take 1 tablet by mouth daily.   CALCIUM + VITAMIN D3 PO Take by mouth.   Centrum Adult 50+ MultiGummies Chew Chew 1 tablet by mouth daily.   cyclobenzaprine  5 MG tablet Commonly known as: FLEXERIL  Take 1 tablet (5 mg total) by mouth 3 (three) times daily as needed for muscle spasms.   divalproex  500 MG DR tablet Commonly known as: DEPAKOTE  Take 500 mg by mouth 2 (two) times daily.   Eliquis  DVT/PE Starter Pack Generic drug: Apixaban  Starter Pack (10mg  and 5mg ) Take as directed on  package: start with two-5mg  tablets twice daily for 7 days. On day 8, switch to one-5mg  tablet twice daily.   apixaban  5 MG Tabs tablet Commonly known as: ELIQUIS  Take 1 tablet (5 mg total) by mouth 2 (two) times daily. Start taking on: October 09, 2024   haloperidol  decanoate 100 MG/ML injection Commonly known as: HALDOL  DECANOATE Inject 100 mg into the muscle every 28 (twenty-eight) days. Once a month   MAGNESIUM  MALATE PO Take 3,750 mg by mouth 2 (two) times daily with a meal.   MALIC ACID PO Take by mouth.   NON FORMULARY Take 1 capsule by mouth daily. SagaPro bladder health   nystatin  powder Commonly known as: MYCOSTATIN /NYSTOP  Apply 1 Application topically 3 (three) times daily.   OVER THE COUNTER MEDICATION Dandilion root liquid drops, [place]-natural herb   OVER THE COUNTER MEDICATION Take 1 capsule by mouth daily. MyPure Mycomune 4x   pantoprazole  40 MG tablet Commonly known as: PROTONIX  Take 1 tablet (40 mg total) by mouth daily. Start taking on: September 12, 2024        Discharge Instructions: Please refer to  Patient Instructions section of EMR for full details.  Patient was counseled important signs and symptoms that should prompt return to medical care, changes in medications, dietary instructions, activity restrictions, and follow up appointments.   Follow-Up Appointments:  09/25/24 w/ Rea Raring MD PCP  Fairy Amy MD PGY-1, [facility]  "

## 2024-09-11 NOTE — Telephone Encounter (Signed)
 Patient Product/process development scientist completed.    The patient is insured through [employer]. Patient has Medicare and is not eligible for a copay card, but may be able to apply for patient assistance or Medicare RX Payment Plan (Patient Must reach out to their plan, if eligible for payment plan), if available.    Ran test claim for Eliquis 5 mg and the current 30 day co-pay is $0.00.  Ran test claim for Xarelto 20 mg and the current 30 day co-pay is $0.00.    This test claim was processed through [facility]- copay amounts may vary at other pharmacies due to pharmacy/plan contracts, or as the patient moves through the different stages of their insurance plan.     Reyes Sharps, CPHT Pharmacy Technician Patient Advocate Specialist Lead [facility] Patient Advocate Team Direct Number: [phone]  Fax: [phone]

## 2024-09-11 NOTE — Progress Notes (Signed)
 OT Cancellation Note  Patient Details Name: Sally Wang MRN: [id number] DOB: 05/27/1961   Cancelled Treatment:    Reason Eval/Treat Not Completed: Other (comment). PT/OT pending US , per MD note.  OT will await results.    Meegan Shanafelt D Ja Ohman 09/11/2024, 1:15 PM 09/11/2024  RP, OTR/L  Acute Rehabilitation Services  Office:  [phone]

## 2024-09-11 NOTE — Progress Notes (Signed)
 Transition of Care [facility]) - Inpatient Brief Assessment   Patient Details  Name: Sally Wang MRN: [id number] Date of Birth: 06/18/1961  Transition of Care [facility]) CM/SW Contact:    Debarah Saunas, RN Phone Number: 09/11/2024, 10:09 AM   Clinical Narrative:  Transition of Care Department [facility]) has reviewed patient and no TOC needs have been identified at this time. We will continue to monitor patient advancement through interdisciplinary progression rounds. If new patient transition needs arise, please place a TOC consult.     Transition of Care Asessment: Insurance and Status: (P) Insurance coverage has been reviewed Patient has primary care physician: (P) Yes Home environment has been reviewed: (P) home with spouse Prior level of function:: (P) independent Prior/Current Home Services: (P) No current home services Social Drivers of Health Review: (P) SDOH reviewed no interventions necessary Readmission risk has been reviewed: (P) Yes Transition of care needs: (P) no transition of care needs at this time

## 2024-09-11 NOTE — Progress Notes (Addendum)
 PHARMACY - ANTICOAGULATION CONSULT NOTE  Pharmacy Consult for heparin  Indication: pulmonary embolus  Allergies[1]  Patient Measurements: Height: 5' 6 (167.6 cm) Weight: 133.8 kg (295 lb) IBW/kg (Calculated) : 59.3 HEPARIN  DW (KG): 92  Vital Signs: Temp: 97.7 F (36.5 C) (12/29 0548) Temp Source: Oral (12/29 0548) BP: 118/98 (12/29 0555) Pulse Rate: 89 (12/29 0555)  Labs: Recent Labs    09/09/24 2230 09/10/24 2248 09/11/24 0539  HGB 13.1  --  12.1  HCT 39.9  --  37.6  PLT 227  --  203  HEPARINUNFRC  --  0.44 0.65  CREATININE 0.99  --  0.98    Estimated Creatinine Clearance: 82.6 mL/min (by C-G formula based on SCr of 0.98 mg/dL).  Assessment: 88 YOF with a PMH of schizoaffective disorder, obesity, hypertension not on anticoagulation PTA who presented with SOB/CP, CT confirmed bilateral segmental and subsegmental pulmonary emboli. Pharmacy consulted to manage IV heparin .   CBC stable, heparin  therapeutic x2 on current rate 1500 units/hr, trending up. Will empirically decrease rate slightly to prevent supra therapeutic levels. DOAC affordability/selection pending.   Goal of Therapy:  Heparin  level 0.3-0.7 units/ml Monitor platelets by anticoagulation protocol: Yes   Plan:  Empiric decrease heparin  infusion to 1450 units/hr Check heparin  level in 8 hours and daily while on heparin  Continue to monitor H&H and platelets F/u plan for transition to oral [facility]  Of note, copay for apixaban  and rivaroxaban currently $0  Thank you for allowing pharmacy to be a part of this patients care.  Shelba Collier, PharmD, BCPS Clinical Pharmacist     [1] No Known Allergies

## 2024-09-11 NOTE — Assessment & Plan Note (Deleted)
 Hypertension - patient uses amlodipine  valsartan  10-320 mg outpatient, pharmacy alternative inpatient is amlodipine  10 mg and irbesartan  300 mg Schizoaffective disorder - continue home Depakote  500 mg twice daily and Austedo  for TD Intertrigo - continue home nystatin  powder

## 2024-09-12 ENCOUNTER — Telehealth: Payer: Self-pay | Admitting: Gastroenterology

## 2024-09-12 NOTE — Telephone Encounter (Signed)
 Good Morning Dr.Armbruster,  Patients husband called needing to cancel upcoming procedure on 09/20/24 due to patient going to the hospital and having to start blood thinners for 3 days. Patient was advised to cancel and reschedule when the time is right  Please advise  Thank you

## 2024-09-12 NOTE — Telephone Encounter (Signed)
 Okay thanks for letting me know. Looks like she was dx with a blood clot and they are recommending a blood thinner for at least 3-6 months. Likely will not be able to reschedule before then. She can call back to reschedule when her PCP determines she can hold the blood thinner for a few days for a procedure

## 2024-09-13 DIAGNOSIS — I10 Essential (primary) hypertension: Secondary | ICD-10-CM

## 2024-09-20 ENCOUNTER — Encounter: Admitting: Gastroenterology

## 2024-09-21 ENCOUNTER — Encounter: Payer: Self-pay | Admitting: Family Medicine

## 2024-09-21 ENCOUNTER — Ambulatory Visit: Admitting: Family Medicine

## 2024-09-21 VITALS — BP 166/95 | HR 97 | Wt 295.2 lb

## 2024-09-21 DIAGNOSIS — F250 Schizoaffective disorder, bipolar type: Secondary | ICD-10-CM | POA: Diagnosis not present

## 2024-09-21 DIAGNOSIS — M545 Low back pain, unspecified: Secondary | ICD-10-CM

## 2024-09-21 DIAGNOSIS — G8929 Other chronic pain: Secondary | ICD-10-CM

## 2024-09-21 DIAGNOSIS — I10 Essential (primary) hypertension: Secondary | ICD-10-CM | POA: Diagnosis not present

## 2024-09-21 DIAGNOSIS — I2699 Other pulmonary embolism without acute cor pulmonale: Secondary | ICD-10-CM | POA: Diagnosis not present

## 2024-09-21 DIAGNOSIS — K219 Gastro-esophageal reflux disease without esophagitis: Secondary | ICD-10-CM

## 2024-09-21 DIAGNOSIS — G2401 Drug induced subacute dyskinesia: Secondary | ICD-10-CM

## 2024-09-21 MED ORDER — BLOOD PRESSURE CUFF MISC: 0 refills | Status: AC

## 2024-09-21 NOTE — Assessment & Plan Note (Signed)
 Patient reports no shortness of breath, chest pain, or change in lower extremity edema as well as good adherence to Eliquis .  Unable to check CBC today given lab closure at lunchtime.  Will collect at next appointment.  Patient needs colonoscopy to rule out presence of malignancy as this may explain her multiple clots.  Colonoscopy will need to be rescheduled once patient is able to hold blood thinners for a few days.  Will revisit this at a future appointment.

## 2024-09-21 NOTE — Progress Notes (Signed)
 "    SUBJECTIVE:   CHIEF COMPLAINT / HPI:   Sally Wang presents today for hospital follow up.   Hospitalized at [facility] from 12/27 to 12/29, for BL PE.  Presented with shortness of breath on exertion, bilateral lower extremity edema, tachycardia.  Found to have bilateral PEs, was on heparin  drip and then transition to DOAC.  Found to have left popliteal vein DVT.  Discharged with 3-6 months of anticoagulation given likely provoked due to immobility.  Also had abdominal pain, workup was unrevealing other than small hiatal hernia and nonobstructed umbilical hernia.  Treated with PPI for GERD.  PCP recommendations for follow up: Ensure continue DOAC use, consider 6 mo course given concurrent popliteal DVT and PEs Consider updating cancer screening such as colonoscopy.  Consider referral to GI if abdominal pain continues, started on pantoprazole , provide instruction regarding GERD and avoid prolonged PPI therapy. Bilateral pulmonary nodules measuring up to 4 mm. No follow-up needed if patient is low-risk (and has no known or suspected primary neoplasm). Non-contrast chest CT can be considered in 12 months if patient is high-risk.  Since discharge, patient reports feeling well, no difficulty breathing, protonix  is helping with GERD.  Has been taking Eliquis  5 mg twice daily after taking 10 mg twice daily for 1 week as instructed.  Was supposed to have colonoscopy yesterday but was cancelled due to clots and DOAC use.  Per GI phone note, she will need to reschedule once she can hold the blood thinners for a few days prior to procedure.  Needs to follow up with psych re TD, started on Austedo  a few weeks ago and doesn't feel that it's helping  Would like a back brace and a larger blood pressure cuff.   PERTINENT  PMH / PSH: reviewed  OBJECTIVE:   BP (!) 166/95   Pulse 97   Wt 295 lb 3.2 oz (133.9 kg)   LMP 02/16/2015   SpO2 96%   BMI 47.65 kg/m   General: Awake and conversant, no  acute distress CV: RRR, normal S1/S2, no M/R/G Pulm: CTAB, normal work of breathing on room air, no W/R/R. Neuro: No focal deficits Psych: Appropriate mood and affect Extremity: 1+ pitting edema to LLE, 2+ pitting edema to RLE, nontender BLE   ASSESSMENT/PLAN:   Assessment & Plan Uncontrolled hypertension Elevated blood pressure today, patient without shortness of breath or chest pain.  Patient states blood pressure cuff that was provided was too small, ordered replacement today.  Reviewed blood pressure readings from hospitalization, were closer to normal at 149/84 and 137/92.  Patient reports adherence with antihypertensive.  Advise follow-up in 1 to 2 weeks to continue to monitor. Acute pulmonary embolism without acute cor pulmonale, unspecified pulmonary embolism type [facility]) Patient reports no shortness of breath, chest pain, or change in lower extremity edema as well as good adherence to Eliquis .  Unable to check CBC today given lab closure at lunchtime.  Will collect at next appointment.  Patient needs colonoscopy to rule out presence of malignancy as this may explain her multiple clots.  Colonoscopy will need to be rescheduled once patient is able to hold blood thinners for a few days.  Will revisit this at a future appointment. Gastroesophageal reflux disease, unspecified whether esophagitis present Patient reports symptom relief with Protonix .  Will monitor and attempt to wean off of this in the coming months to avoid long-term ADE's.  Can consider GI referral if symptoms persist or worsen. Schizoaffective disorder, bipolar type (HCC)  Tardive dyskinesia Given report that Austedo  is not helping much, patient needs to follow-up with psychiatry to manage TD medication.  Advised patient to contact psychiatry to set up appointment. Chronic bilateral low back pain without sciatica Reports ongoing back pain, requesting back brace.  Advised scheduling follow-up appointment to discuss this  further.  Advised patient to not use NSAIDs given current use of blood thinner.     Rea Raring, MD [facility]  "

## 2024-09-21 NOTE — Patient Instructions (Addendum)
 Thank you for coming in today! Here is a summary of what we discussed:  - Continue taking the Eliquis  1 tablet twice daily  - Continue taking the Protonix  daily  - Please follow-up with your psychiatrist to discuss your tremors  -I sent in an order for a large blood pressure cuff  -Please schedule follow-up appoint with me in the next 1 to 2 weeks  Please call the clinic at [phone] if your symptoms worsen or you have any concerns.  Best, Dr Adele

## 2024-09-21 NOTE — Assessment & Plan Note (Signed)
 Patient reports symptom relief with Protonix .  Will monitor and attempt to wean off of this in the coming months to avoid long-term ADE's.  Can consider GI referral if symptoms persist or worsen.

## 2024-09-21 NOTE — Assessment & Plan Note (Addendum)
 Elevated blood pressure today, patient without shortness of breath or chest pain.  Patient states blood pressure cuff that was provided was too small, ordered replacement today.  Reviewed blood pressure readings from hospitalization, were closer to normal at 149/84 and 137/92.  Patient reports adherence with antihypertensive.  Advise follow-up in 1 to 2 weeks to continue to monitor.

## 2024-09-21 NOTE — Assessment & Plan Note (Signed)
 Given report that Austedo  is not helping much, patient needs to follow-up with psychiatry to manage TD medication.  Advised patient to contact psychiatry to set up appointment.

## 2024-09-25 ENCOUNTER — Ambulatory Visit: Admitting: Family Medicine

## 2024-09-26 ENCOUNTER — Other Ambulatory Visit: Payer: Self-pay | Admitting: Family Medicine

## 2024-10-02 ENCOUNTER — Other Ambulatory Visit (HOSPITAL_COMMUNITY): Payer: Self-pay

## 2024-10-03 ENCOUNTER — Telehealth: Payer: Self-pay

## 2024-10-03 LAB — HOUSE ACCOUNT TRACKING

## 2024-10-03 LAB — FECAL GLOBIN BY IMMUNOCHEMISTRY

## 2024-10-03 NOTE — Telephone Encounter (Signed)
 Sally Wang with [employer] calls nurse line requesting PCP information.   He reports they received a Fit Test from Dr. Adele. He reports he needs our fax to send results.   Fax number given for our office.   Will forward to PCP to make aware.

## 2024-10-05 ENCOUNTER — Other Ambulatory Visit: Payer: Self-pay

## 2024-10-06 ENCOUNTER — Ambulatory Visit: Payer: Self-pay | Admitting: Family Medicine

## 2024-10-06 ENCOUNTER — Encounter: Payer: Self-pay | Admitting: Family Medicine

## 2024-10-06 VITALS — BP 154/101 | HR 121 | Ht 66.0 in | Wt 293.8 lb

## 2024-10-06 DIAGNOSIS — G2401 Drug induced subacute dyskinesia: Secondary | ICD-10-CM | POA: Diagnosis not present

## 2024-10-06 DIAGNOSIS — Z6841 Body Mass Index (BMI) 40.0 and over, adult: Secondary | ICD-10-CM | POA: Diagnosis not present

## 2024-10-06 DIAGNOSIS — R000 Tachycardia, unspecified: Secondary | ICD-10-CM

## 2024-10-06 DIAGNOSIS — R251 Tremor, unspecified: Secondary | ICD-10-CM | POA: Insufficient documentation

## 2024-10-06 DIAGNOSIS — I10 Essential (primary) hypertension: Secondary | ICD-10-CM | POA: Diagnosis not present

## 2024-10-06 DIAGNOSIS — Z1211 Encounter for screening for malignant neoplasm of colon: Secondary | ICD-10-CM

## 2024-10-06 DIAGNOSIS — G8929 Other chronic pain: Secondary | ICD-10-CM

## 2024-10-06 DIAGNOSIS — M545 Low back pain, unspecified: Secondary | ICD-10-CM

## 2024-10-06 DIAGNOSIS — E66813 Obesity, class 3: Secondary | ICD-10-CM | POA: Diagnosis not present

## 2024-10-06 DIAGNOSIS — I2699 Other pulmonary embolism without acute cor pulmonale: Secondary | ICD-10-CM | POA: Diagnosis not present

## 2024-10-06 MED ORDER — LIDOCAINE 5 % EX PTCH: 1.0000 | MEDICATED_PATCH | CUTANEOUS | 0 refills | Status: AC

## 2024-10-06 MED ORDER — DICLOFENAC SODIUM 1 % EX GEL: 2.0000 g | Freq: Four times a day (QID) | CUTANEOUS | 1 refills | Status: AC | PRN

## 2024-10-06 MED ORDER — HYDROCHLOROTHIAZIDE 12.5 MG PO TABS: 12.5000 mg | ORAL_TABLET | Freq: Every day | ORAL | 0 refills | Status: AC

## 2024-10-06 NOTE — Assessment & Plan Note (Addendum)
 Tachycardic to the 120s today. No abnormalities on exam. Reassuringly, no dyspnea or chest pain and patient adherent with DOAC. Reviewing chart, HR is typically around 100. May be due to medication ADE (haldol , depakote ). Advised 1 week follow up to reassess. Reviewed returned precautions. Will need to determine Eliquis  end date moving forward (3 vs 6 months)

## 2024-10-06 NOTE — Assessment & Plan Note (Addendum)
 In the setting of long term Haldol  use, on Austedo  per psychiatrist. CTM

## 2024-10-06 NOTE — Assessment & Plan Note (Addendum)
 Last lipid panel 4 years ago, will repeat today.

## 2024-10-06 NOTE — Progress Notes (Signed)
 "   SUBJECTIVE:   CHIEF COMPLAINT / HPI:   HTN  -taking amlodipine -valsartan , does not have home BP cuff  Tachycardia Known BL PE --no cp, sob, palpitations, or changes in lower extremity edema.  Feels RLE swelling is actually improved -- taking eliquis  daily, denies missed doses -- Denies blood in stool or any other signs of bleeding  Colon cancer screening --Completed FIT test but results were canceled  TD Tremor Schizoaffective disorder on chronic Haldol  --Austedo  wasn't helping, psychiatrist increased the dosage at appt yesterday and had risk-benefit conversation with patient - Has not had recent lipid panel  Low back pain  --BL flanks, stiffness --a few years --taking tylenol  4-6 hours daily, heating blanket, walking more --no history trauma or falls --no numbness, weakness, tingling in legs --no urinary or bowel incontinence --no saddle anesthesia --no sciatica --weight has been stable --wears a brace  Urinary frequency -- States this is ongoing --No dysuria -- Has not been seen by urology  PERTINENT  PMH / PSH: schizoaffective disorder  OBJECTIVE:   BP (!) 154/101   Pulse (!) 121   Ht 5' 6 (1.676 m)   Wt 293 lb 12.8 oz (133.3 kg)   LMP 02/16/2015   SpO2 96%   BMI 47.42 kg/m   General: Awake and conversant, no acute distress CV: RRR, normal S1/S2, no M/R/G Pulm: CTAB, normal work of breathing on room air, no W/R/R. Ext: stable pitting edema to BLE (R>L), no, warmth or TTP BL MSK: no point tenderness along spinal canal or paraspinal muscles. Mild TTP at BL flanks. No CVA tenderness. Neuro: BUE tremor unchanged from prior, no focal deficits Psych: Appropriate mood and affect   ASSESSMENT/PLAN:   Assessment & Plan Uncontrolled hypertension Remains poorly controlled on 2 agents. Will initiate low dose hydrochlorothiazide  today and obtain BMP at follow up visit. Advised f/u in 1 week given tachycardia as below - hydrochlorothiazide  (HYDRODIURIL )  12.5 MG tablet; Take 1 tablet (12.5 mg total) by mouth daily.  Dispense: 30 tablet; Refill: 0  Acute pulmonary embolism without acute cor pulmonale, unspecified pulmonary embolism type (HCC) Tachycardia Tachycardic to the 120s today. No abnormalities on exam. Reassuringly, no dyspnea or chest pain and patient adherent with DOAC. Reviewing chart, HR is typically around 100. May be due to medication ADE (haldol , depakote ). Advised 1 week follow up to reassess. Reviewed returned precautions. Will need to determine Eliquis  end date moving forward (3 vs 6 months) Class 3 severe obesity with serious comorbidity and body mass index (BMI) of 45.0 to 49.9 in adult, unspecified obesity type (HCC) Last lipid panel 4 years ago, will repeat today. Chronic bilateral low back pain without sciatica No red flags today. Pt advised against systemic NSAIDs due to ongoing DOAC. Advised multimodal regimen with tylenol , heat/ice, physical activity, and topical agents (lidocaine  patch, voltaren ). Will reassess at follow up visits. - lidocaine  (LIDODERM ) 5 %; Place 1 patch onto the skin daily. Remove & Discard patch within 12 hours or as directed by MD  Dispense: 30 patch; Refill: 0 - diclofenac  Sodium (VOLTAREN ) 1 % GEL; Apply 2 g topically 4 (four) times daily as needed (pain).  Dispense: 150 g; Refill: 1 Tardive dyskinesia Tremor related to medication In the setting of long term Haldol  use, on Austedo  per psychiatrist. CTM Screen for colon cancer Issue with FIT testing. Messaged [facility] lab programmer, systems. Will follow up with patient to repeat testing if needed. Cannot obtain colonoscopy while on DOAC.     Rea  Nanami Whitelaw, MD [facility] "

## 2024-10-06 NOTE — Assessment & Plan Note (Addendum)
 Remains poorly controlled on 2 agents. Will initiate low dose hydrochlorothiazide  today and obtain BMP at follow up visit. Advised f/u in 1 week given tachycardia as below - hydrochlorothiazide  (HYDRODIURIL ) 12.5 MG tablet; Take 1 tablet (12.5 mg total) by mouth daily.  Dispense: 30 tablet; Refill: 0

## 2024-10-06 NOTE — Patient Instructions (Addendum)
 Thank you for coming in today! Here is a summary of what we discussed:  - We are starting another blood pressure medicine today called HCTZ.  Please schedule follow-up in appt with me in 1 week  - I am checking cholesterol level on you as well  - Please continue using Tylenol , heat, physical activity, and the back brace for your low back pain.  You can also try the lidocaine  patches and the Voltaren .  -I will let you know about the stool testing   We are checking some labs today. If they are abnormal, I will call you. If they are normal, I will send you a MyChart message (if it is active) or a letter in the mail. If you do not hear about your labs in the next 2 weeks, please call the office.   Please call the clinic at [phone] if your symptoms worsen or you have any concerns.  Best, Dr Adele

## 2024-10-07 LAB — LIPID PANEL
Chol/HDL Ratio: 3.1 ratio (ref 0.0–4.4)
Cholesterol, Total: 206 mg/dL — ABNORMAL HIGH (ref 100–199)
HDL: 67 mg/dL
LDL Chol Calc (NIH): 124 mg/dL — ABNORMAL HIGH (ref 0–99)
Triglycerides: 85 mg/dL (ref 0–149)
VLDL Cholesterol Cal: 15 mg/dL (ref 5–40)

## 2024-10-09 ENCOUNTER — Other Ambulatory Visit: Payer: Self-pay | Admitting: Family Medicine

## 2024-10-09 ENCOUNTER — Ambulatory Visit: Payer: Self-pay | Admitting: Family Medicine

## 2024-10-09 DIAGNOSIS — Z1211 Encounter for screening for malignant neoplasm of colon: Secondary | ICD-10-CM

## 2024-10-17 ENCOUNTER — Encounter: Payer: Self-pay | Admitting: Family Medicine

## 2024-10-17 ENCOUNTER — Ambulatory Visit: Payer: Self-pay | Admitting: Family Medicine

## 2024-10-20 NOTE — Progress Notes (Signed)
 Sally Wang                                          MRN: [id number]   10/20/2024   The [facility] Quality Team Specialist reviewed this patient medical record for the purposes of chart review for care gap closure. The following were reviewed: chart review for care gap closure-colorectal cancer screening and diabetic eye exam.    [facility] Quality Team

## 2024-11-02 ENCOUNTER — Encounter: Admitting: Neurology
